# Patient Record
Sex: Female | Born: 1975 | Race: White | Hispanic: No | Marital: Married | Smoking: Never smoker
Health system: Southern US, Community
[De-identification: ages and names within clinical notes are randomized; demographics above are authoritative.]

## PROBLEM LIST (undated history)

## (undated) DIAGNOSIS — T7840XA Allergy, unspecified, initial encounter: Secondary | ICD-10-CM

## (undated) DIAGNOSIS — R011 Cardiac murmur, unspecified: Secondary | ICD-10-CM

## (undated) DIAGNOSIS — B009 Herpesviral infection, unspecified: Secondary | ICD-10-CM

## (undated) HISTORY — DX: Herpesviral infection, unspecified: B00.9

## (undated) HISTORY — DX: Cardiac murmur, unspecified: R01.1

## (undated) HISTORY — DX: Allergy, unspecified, initial encounter: T78.40XA

---
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
## 1998-11-22 HISTORY — PX: TONSILECTOMY/ADENOIDECTOMY WITH MYRINGOTOMY: SHX6125

## 2011-08-23 HISTORY — PX: KNEE SURGERY: SHX244

## 2012-12-01 ENCOUNTER — Ambulatory Visit (INDEPENDENT_AMBULATORY_CARE_PROVIDER_SITE_OTHER): Payer: Managed Care, Other (non HMO) | Admitting: Physician Assistant

## 2012-12-01 ENCOUNTER — Encounter: Payer: Self-pay | Admitting: Physician Assistant

## 2012-12-01 VITALS — BP 115/68 | HR 73 | Ht 61.5 in | Wt 121.0 lb

## 2012-12-01 DIAGNOSIS — J302 Other seasonal allergic rhinitis: Secondary | ICD-10-CM

## 2012-12-01 DIAGNOSIS — B002 Herpesviral gingivostomatitis and pharyngotonsillitis: Secondary | ICD-10-CM

## 2012-12-01 DIAGNOSIS — A6000 Herpesviral infection of urogenital system, unspecified: Secondary | ICD-10-CM

## 2012-12-01 DIAGNOSIS — J309 Allergic rhinitis, unspecified: Secondary | ICD-10-CM | POA: Insufficient documentation

## 2012-12-01 DIAGNOSIS — Z23 Encounter for immunization: Secondary | ICD-10-CM

## 2012-12-01 MED ORDER — ACYCLOVIR 800 MG PO TABS: 800.0000 mg | ORAL_TABLET | Freq: Two times a day (BID) | ORAL | Status: DC

## 2012-12-01 MED ORDER — MOMETASONE FUROATE 50 MCG/ACT NA SUSP: 2.0000 | Freq: Every day | NASAL | Status: DC

## 2012-12-01 NOTE — Progress Notes (Signed)
  Subjective:    Patient ID: Whitney Turner, female    DOB: Aug 25, 1976, 37 y.o.   MRN: [id number]  HPI Patient is a 37 year old female who presents to the clinic to establish care. Past medical history reviewed and positive for seasonal allergies, oral and genital herpes, allergic rhinitis.  Seasonal allergies are ongoing and controlled with Zyrtec daily. 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 She has used Nasonex In the past and has worked well. She would like a prescription for it today.  Patient has ongoing but controlled oral and genital herpes. She has not had an outbreak in over a year. She would however like a prescription just in case she gets an outbreak.   Review of Systems  Constitutional: Negative.   Eyes: Negative.   Respiratory: Negative.   Cardiovascular: Negative.   Musculoskeletal: Negative.   Neurological: Negative.   Psychiatric/Behavioral: Negative.        Objective:   Physical Exam  Constitutional: She is oriented to person, place, and time. She appears well-developed and well-nourished.  HENT:  Head: Normocephalic and atraumatic.  Right Ear: External ear normal.  Left Ear: External ear normal.  Nose: Nose normal.  Mouth/Throat: Oropharynx is clear and moist. No oropharyngeal exudate.  Eyes: Conjunctivae normal are normal.  Neck: Normal range of motion. Neck supple.  Cardiovascular: Normal rate and regular rhythm.   Murmur heard.      Grade 2/3 systolic ejection murmur.  Neurological: She is alert and oriented to person, place, and time.  Skin: Skin is warm and dry.  Psychiatric: She has a normal mood and affect. Her behavior is normal.          Assessment & Plan:  Seasonal allergies/allergic rhinitis-encouraged patient to continue taking Zyrtec daily. Did give prescription for Nasonex to use as needed for allergic rhinitis.   oral and genital herpes-did give  prescription for acyclovir for 5 days for outbreaks.  Note was written for patient to give to employer that blood pressure was under control and no concerning signs or symptoms on exam today.  TF was given in office today with no complications.  Patient was encouraged to make complete physical/appointment and come in fasting to have labs drawn in the near future.

## 2012-12-01 NOTE — Patient Instructions (Signed)
Given rx for nasonex and acyclovir.

## 2012-12-08 ENCOUNTER — Encounter: Payer: Self-pay | Admitting: Physician Assistant

## 2012-12-08 ENCOUNTER — Ambulatory Visit (INDEPENDENT_AMBULATORY_CARE_PROVIDER_SITE_OTHER): Payer: Managed Care, Other (non HMO) | Admitting: Physician Assistant

## 2012-12-08 ENCOUNTER — Other Ambulatory Visit (HOSPITAL_COMMUNITY)
Admission: RE | Admit: 2012-12-08 | Discharge: 2012-12-08 | Disposition: A | Discharge status: Home / Self Care | Payer: Managed Care, Other (non HMO) | Source: Ambulatory Visit | Attending: Family Medicine | Admitting: Family Medicine

## 2012-12-08 VITALS — BP 113/72 | HR 74 | Wt 121.0 lb

## 2012-12-08 DIAGNOSIS — Z131 Encounter for screening for diabetes mellitus: Secondary | ICD-10-CM

## 2012-12-08 DIAGNOSIS — Z13220 Encounter for screening for lipoid disorders: Secondary | ICD-10-CM

## 2012-12-08 DIAGNOSIS — Z01419 Encounter for gynecological examination (general) (routine) without abnormal findings: Secondary | ICD-10-CM

## 2012-12-08 DIAGNOSIS — Z1151 Encounter for screening for human papillomavirus (HPV): Secondary | ICD-10-CM | POA: Insufficient documentation

## 2012-12-08 DIAGNOSIS — Z8349 Family history of other endocrine, nutritional and metabolic diseases: Secondary | ICD-10-CM

## 2012-12-08 DIAGNOSIS — R87810 Cervical high risk human papillomavirus (HPV) DNA test positive: Secondary | ICD-10-CM | POA: Insufficient documentation

## 2012-12-08 DIAGNOSIS — Z124 Encounter for screening for malignant neoplasm of cervix: Secondary | ICD-10-CM

## 2012-12-08 LAB — COMPREHENSIVE METABOLIC PANEL
BUN: 15 mg/dL (ref 6–23)
CO2: 28 mEq/L (ref 19–32)
Glucose, Bld: 84 mg/dL (ref 70–99)
Sodium: 137 mEq/L (ref 135–145)
Total Bilirubin: 0.6 mg/dL (ref 0.3–1.2)
Total Protein: 7 g/dL (ref 6.0–8.3)

## 2012-12-08 LAB — TSH: TSH: 1.131 u[IU]/mL (ref 0.350–4.500)

## 2012-12-08 LAB — LIPID PANEL
Cholesterol: 211 mg/dL — ABNORMAL HIGH (ref 0–200)
HDL: 65 mg/dL (ref >39)
Triglycerides: 43 mg/dL (ref <150)

## 2012-12-08 NOTE — Progress Notes (Signed)
  Subjective:    Patient ID: Whitney Turner, female    DOB: 1976-06-22, 37 y.o.   MRN: [id number]  HPI  Period ares normal. No problems. Stayed on BC   Review of Systems     Objective:   Physical Exam        Assessment & Plan:   Subjective:     Whitney Turner is a 37 y.o. female and is here for a comprehensive physical exam. The patient reports no problems.  Periods are regular and with normal flow. No problems and no concerns.   Her sister has a lot of thyroid issues she wants checked today.   History   Social History  . Marital Status: Married    Spouse Name: N/A    Number of Children: N/A  . Years of Education: N/A   Occupational History  . Not on file.   Social History Main Topics  . Smoking status: Never Smoker   . Smokeless tobacco: Not on file  . Alcohol Use: Not on file  . Drug Use: Not on file  . Sexually Active: Yes   Other Topics Concern  . Not on file   Social History Narrative  . No narrative on file   Health Maintenance  Topic Date Due  . Pap Smear  10/02/1996  . Tetanus/tdap  12/01/2022  . Influenza Vaccine  07/23/2012    The following portions of the patient's history were reviewed and updated as appropriate: allergies, current medications, past family history, past medical history, past social history, past surgical history and problem list.  Review of Systems A comprehensive review of systems was negative.   Objective:    BP 113/72  Pulse 74  Wt 121 lb (54.885 kg)  LMP 11/13/2012 General appearance: alert, cooperative and appears stated age Head: Normocephalic, without obvious abnormality, atraumatic Eyes: conjunctivae/corneas clear. PERRL, EOM's intact. Fundi benign. Ears: normal TM's and external ear canals both ears Nose: Nares normal. Septum midline. Mucosa normal. No drainage or sinus tenderness. Throat: lips, mucosa, and tongue normal; teeth and gums normal Neck: no adenopathy, no carotid bruit, no JVD, supple, symmetrical,  trachea midline and thyroid not enlarged, symmetric, no tenderness/mass/nodules Back: symmetric, no curvature. ROM normal. No CVA tenderness. Lungs: clear to auscultation bilaterally Breasts: normal appearance, no masses or tenderness Heart: regular rate and rhythm, S1, S2 normal, no murmur, click, rub or gallop Abdomen: soft, non-tender; bowel sounds normal; no masses,  no organomegaly Pelvic: cervix normal in appearance, external genitalia normal, no adnexal masses or tenderness, no cervical motion tenderness, uterus normal size, shape, and consistency and vagina normal without discharge Extremities: extremities normal, atraumatic, no cyanosis or edema Pulses: 2+ and symmetric Skin: Skin color, texture, turgor normal. No rashes or lesions Lymph nodes: Cervical, supraclavicular, and axillary nodes normal. Neurologic: Grossly normal    Assessment:    Healthy female exam.      Plan:    CPE- will call pt with results of fasting labs. All vaccines are up to date. Will check TSH due to family history of issues. Encouraged pt to exercise regularly, maintain a healthy diet, Start taking MVI with calcium and vitamin D.Will call with pap results. Follow up in 1year.  See After Visit Summary for Counseling Recommendations

## 2012-12-08 NOTE — Patient Instructions (Signed)
Reminder:  Exercise regularly.  Calcium and Vitamin D for bone health.   Wil call with labs.

## 2012-12-11 ENCOUNTER — Other Ambulatory Visit: Payer: Self-pay | Admitting: *Deleted

## 2012-12-11 MED ORDER — MOMETASONE FUROATE 50 MCG/ACT NA SUSP: 2.0000 | Freq: Every day | NASAL | Status: DC

## 2012-12-11 MED ORDER — ACYCLOVIR 800 MG PO TABS: 800.0000 mg | ORAL_TABLET | Freq: Two times a day (BID) | ORAL | Status: DC

## 2012-12-15 ENCOUNTER — Telehealth: Payer: Self-pay | Admitting: Physician Assistant

## 2012-12-15 NOTE — Telephone Encounter (Signed)
LM with patient to call regarding pap smear results. Pt needs to call back to get these results.

## 2012-12-18 ENCOUNTER — Telehealth: Payer: Self-pay | Admitting: Physician Assistant

## 2012-12-18 ENCOUNTER — Other Ambulatory Visit: Payer: Self-pay | Admitting: Physician Assistant

## 2012-12-18 DIAGNOSIS — IMO0002 Reserved for concepts with insufficient information to code with codable children: Secondary | ICD-10-CM

## 2012-12-18 NOTE — Telephone Encounter (Signed)
Whitney Turner was able to contact patient and referral made.

## 2012-12-18 NOTE — Telephone Encounter (Signed)
I have tried calling 3 times to reach patient. I would like for you to mail results of abnormal pap and see if patient will call us to get follow up instructions. I will put copy of pap on desk for Itati.

## 2012-12-19 ENCOUNTER — Telehealth: Payer: Self-pay | Admitting: *Deleted

## 2012-12-19 NOTE — Telephone Encounter (Signed)
Pt calls this morning & wants to know if there is an antibiotic or something that she should be taking for HPV?  Please advise

## 2012-12-20 NOTE — Telephone Encounter (Signed)
Left message for pt to call back  °

## 2012-12-20 NOTE — Telephone Encounter (Signed)
Left message on machine telling pt that there is no abx to take.  She is following up with gyn feb 13th

## 2012-12-20 NOTE — Telephone Encounter (Signed)
There is no abx. It is a sexually transmitted virus that can lead to cervical cancer.

## 2013-01-04 ENCOUNTER — Encounter: Payer: Managed Care, Other (non HMO) | Admitting: Obstetrics & Gynecology

## 2013-01-11 ENCOUNTER — Encounter: Payer: Self-pay | Admitting: Family Medicine

## 2013-01-11 ENCOUNTER — Ambulatory Visit (INDEPENDENT_AMBULATORY_CARE_PROVIDER_SITE_OTHER): Payer: Managed Care, Other (non HMO) | Admitting: Family Medicine

## 2013-01-11 ENCOUNTER — Encounter: Payer: Managed Care, Other (non HMO) | Admitting: Family Medicine

## 2013-01-11 VITALS — BP 119/80 | HR 68 | Temp 98.0°F | Ht 61.5 in | Wt 126.0 lb

## 2013-01-11 DIAGNOSIS — B9689 Other specified bacterial agents as the cause of diseases classified elsewhere: Secondary | ICD-10-CM

## 2013-01-11 DIAGNOSIS — A499 Bacterial infection, unspecified: Secondary | ICD-10-CM

## 2013-01-11 DIAGNOSIS — J329 Chronic sinusitis, unspecified: Secondary | ICD-10-CM

## 2013-01-11 MED ORDER — AMOXICILLIN-POT CLAVULANATE 500-125 MG PO TABS: ORAL_TABLET | ORAL | Status: AC

## 2013-01-11 MED ORDER — FLUTICASONE PROPIONATE 50 MCG/ACT NA SUSP: NASAL | Status: DC

## 2013-01-11 NOTE — Progress Notes (Signed)
CC: Whitney Turner is a 37 y.o. female is here for Sinusitis   Subjective: HPI:  Patient of onset for head pressure with nasal congestion for 4 weeks. It has been getting worse a weekly basis. She's been treating it with ibuprofen which helps relieve the pain, nasal congestion is present all hours of the day and nothing makes better or worse. Sinus pressure is worse with exertion. Pain is described as moderate in severity and nonradiating. She's taking Zyrtec as well but hasn't noticed much improvement. She has a sore throat every other day, she's unsure what makes it better or worse. She denies fevers, chills, cough, chest congestion, chest pain, shortness of breath, orthopnea. She denies motor sensory disturbances   Review Of Systems Outlined In HPI  Past Medical History  Diagnosis Date  . Allergy   . Herpes   . Heart murmur      Family History  Problem Relation Age of Onset  . Hypertension Father   . Hyperlipidemia Father   . Cancer Maternal Grandmother     breast cancer  . Cancer Maternal Grandfather     lung cancer  . Alzheimer's disease Paternal Grandmother   . Cancer Paternal Grandfather     lung cancer     History  Substance Use Topics  . Smoking status: Never Smoker   . Smokeless tobacco: Not on file  . Alcohol Use: Not on file     Objective: Filed Vitals:   01/11/13 1107  BP: 119/80  Pulse: 68  Temp: 98 F (36.7 C)    General: Alert and Oriented, No Acute Distress HEENT: Pupils equal, round, reactive to light. Conjunctivae clear.  External ears unremarkable, canals clear with intact TMs with appropriate landmarks.  Middle ear appears open without effusion. Boggy inferior turbinates with mild mucoid discharge.  Moist mucous membranes, pharynx without inflammation nor lesions.  Neck supple without palpable lymphadenopathy nor abnormal masses. Frontal sinus tenderness to percussion Lungs: Clear to auscultation bilaterally, no wheezing/ronchi/rales.  Comfortable  work of breathing. Good air movement. Cardiac: Regular rate and rhythm. Normal S1/S2.  No murmurs, rubs, nor gallops.   Extremities: No peripheral edema.  Strong peripheral pulses.  Mental Status: No depression, anxiety, nor agitation. Skin: Warm and dry.  Assessment & Plan: Asharia was seen today for sinusitis.  Diagnoses and associated orders for this visit:  Bacterial sinusitis - fluticasone (FLONASE) 50 MCG/ACT nasal spray; One spray each nostril daily. - amoxicillin-clavulanate (AUGMENTIN) 500-125 MG per tablet; Take one by mouth every 8 hours for ten total days.    Bacterial sinusitis: Given that symptoms are greater than 10 days like her to start Augmentin, discussed nasal saline washes and continuation of Zyrtec and ibuprofen as needed. Nasonex was over $100 which is why she stopped it months ago. Will try Flonase and encouraged her to ask for formulary substitution options if expensive.  Return if symptoms worsen or fail to improve.

## 2013-01-31 ENCOUNTER — Encounter: Payer: Self-pay | Admitting: Obstetrics & Gynecology

## 2013-01-31 ENCOUNTER — Ambulatory Visit (INDEPENDENT_AMBULATORY_CARE_PROVIDER_SITE_OTHER): Payer: Managed Care, Other (non HMO) | Admitting: Obstetrics & Gynecology

## 2013-01-31 VITALS — BP 120/67 | HR 76 | Resp 16 | Ht 61.5 in | Wt 123.0 lb

## 2013-01-31 DIAGNOSIS — N898 Other specified noninflammatory disorders of vagina: Secondary | ICD-10-CM

## 2013-01-31 DIAGNOSIS — Z01812 Encounter for preprocedural laboratory examination: Secondary | ICD-10-CM

## 2013-01-31 DIAGNOSIS — R87610 Atypical squamous cells of undetermined significance on cytologic smear of cervix (ASC-US): Secondary | ICD-10-CM

## 2013-01-31 DIAGNOSIS — Z139 Encounter for screening, unspecified: Secondary | ICD-10-CM

## 2013-01-31 LAB — POCT URINE PREGNANCY: Preg Test, Ur: NEGATIVE

## 2013-01-31 NOTE — Progress Notes (Signed)
  Subjective:    Patient ID: Whitney Turner, female    DOB: [birth year], 37 y.o.   MRN: [id number]  HPI  Mrs. Ramires is a 37 yo MW P3 non- smoker who had her first abnormal pap, ASCUS, +HR HPV DNA, in January. She is here today for a colposcopy. She also complains of a new increased non-smelly, clear/white vaginal discharge necessitating the use of a panty liner.  Review of Systems     Objective:   Physical Exam UPT negative, consent signed, time-out done Acetic acid applied for 2 minutes Colpo adequate A small line of punctation slowly appeared from the 11 o'clock position to the 2 o'clock position. I biopsied this and used silver nitrate to yield hemostasis. I did an ECC.  She tolerated the procedure well.       Assessment & Plan:  Await biopsies Wet prep sent.

## 2013-02-02 ENCOUNTER — Telehealth: Payer: Self-pay | Admitting: *Deleted

## 2013-02-02 MED ORDER — ACYCLOVIR 800 MG PO TABS: 800.0000 mg | ORAL_TABLET | Freq: Two times a day (BID) | ORAL | Status: DC

## 2013-02-02 NOTE — Telephone Encounter (Signed)
Pt calls requesting an rx for generic valtrex sent to [facility].

## 2013-02-02 NOTE — Telephone Encounter (Signed)
LMOM notifying pt. 

## 2013-02-02 NOTE — Telephone Encounter (Signed)
Sent what we had given before to treat.

## 2013-02-03 LAB — WET PREP, GENITAL: Yeast Wet Prep HPF POC: NONE SEEN

## 2013-02-05 ENCOUNTER — Telehealth: Payer: Self-pay | Admitting: *Deleted

## 2013-02-05 DIAGNOSIS — N898 Other specified noninflammatory disorders of vagina: Secondary | ICD-10-CM

## 2013-02-05 MED ORDER — METRONIDAZOLE 500 MG PO TABS: 500.0000 mg | ORAL_TABLET | Freq: Two times a day (BID) | ORAL | Status: DC

## 2013-02-05 NOTE — Telephone Encounter (Signed)
Called pt to [facility] meds at [facility]

## 2013-02-14 ENCOUNTER — Other Ambulatory Visit: Payer: Self-pay | Admitting: Obstetrics & Gynecology

## 2013-06-11 ENCOUNTER — Other Ambulatory Visit: Payer: Self-pay | Admitting: Physician Assistant

## 2013-06-11 ENCOUNTER — Telehealth: Payer: Self-pay | Admitting: *Deleted

## 2013-06-11 MED ORDER — METRONIDAZOLE 500 MG PO TABS: 500.0000 mg | ORAL_TABLET | Freq: Two times a day (BID) | ORAL | Status: DC

## 2013-06-11 NOTE — Progress Notes (Signed)
Pt stated that she did get both tampons out.  Faxing rx to [facility] since pt is driving thru town again this afternoon.

## 2013-06-11 NOTE — Progress Notes (Signed)
Where do I need to fax too? I printed.

## 2013-06-11 NOTE — Telephone Encounter (Signed)
Pt left a message stating that she has been on a road trip & accidentally inserted 2 tampons & now has discharge.  She wants to know if you could call her in metronidazole.  She is heading to [place] today so if you're ok with that I can get a specific pharm to send it to in [place].

## 2013-06-11 NOTE — Telephone Encounter (Signed)
Since out of town will send [facility] for BV. Make sure you get both tampons out if not could turn into something more dangerous.

## 2013-08-14 ENCOUNTER — Other Ambulatory Visit: Payer: Self-pay | Admitting: Physician Assistant

## 2014-01-07 ENCOUNTER — Ambulatory Visit (INDEPENDENT_AMBULATORY_CARE_PROVIDER_SITE_OTHER): Payer: 59 | Admitting: Physician Assistant

## 2014-01-07 ENCOUNTER — Encounter: Payer: Self-pay | Admitting: Physician Assistant

## 2014-01-07 VITALS — BP 112/54 | HR 70 | Temp 98.0°F | Wt 123.0 lb

## 2014-01-07 DIAGNOSIS — J029 Acute pharyngitis, unspecified: Secondary | ICD-10-CM

## 2014-01-07 LAB — POCT RAPID STREP A (OFFICE): Rapid Strep A Screen: NEGATIVE

## 2014-01-07 MED ORDER — METHYLPREDNISOLONE (PAK) 4 MG PO TABS: ORAL_TABLET | ORAL | Status: DC

## 2014-01-07 NOTE — Patient Instructions (Signed)
Add sudafed for next 5 days.  Flonase regularly.  Continue on zyrtec.  Use medrol dose pk if symptoms continue.

## 2014-01-07 NOTE — Progress Notes (Signed)
   Subjective:    Patient ID: Whitney Turner, female    DOB: 05/19/1976, 38 y.o.   MRN: [id number]  HPI Patient is a 38 year old female who presents to the clinic with sore throat for the last week and a half. She denies any sinus pressure, ear pain, cough, shortness of breath or wheezing. She has not had any fever. She denies any sick contacts. She's tried Chloraseptic spray and ibuprofen which has helped some. She just wanted to make sure she did not have strep. She takes Zyrtec daily.     Review of Systems     Objective:   Physical Exam  Constitutional: She is oriented to person, place, and time. She appears well-developed and well-nourished.  HENT:  Head: Normocephalic and atraumatic.  Right Ear: External ear normal.  Left Ear: External ear normal.  Nose: Nose normal.  TMs clear bilaterally.  Oropharynx erythematous without tonsil or exudate. Some postnasal drip present on exam today.  Eyes: Conjunctivae are normal. Right eye exhibits no discharge. Left eye exhibits no discharge.  Neck: Normal range of motion. Neck supple.  Cardiovascular: Normal rate, regular rhythm and normal heart sounds.   Pulmonary/Chest: Effort normal and breath sounds normal. She has no wheezes.  Lymphadenopathy:    She has no cervical adenopathy.  Neurological: She is alert and oriented to person, place, and time.  Skin: Skin is dry.  Psychiatric: She has a normal mood and affect. Her behavior is normal.          Assessment & Plan:  Acute pharyngitis-rapid strep negative .discuss with patient likely viral. Discussed other symptomatic treatment patient could initiate. Encourage adding Sudafed for the next 5 days to the Zyrtec. Consider using nasal spray at home daily for the next 5 days. If continuing then could try a Medrol Dosepak to help decrease inflammation. Call if symptoms worsening or develops fever.

## 2014-01-31 ENCOUNTER — Other Ambulatory Visit: Payer: Self-pay | Admitting: Physician Assistant

## 2014-06-05 ENCOUNTER — Encounter: Payer: Self-pay | Admitting: Physician Assistant

## 2014-06-05 ENCOUNTER — Ambulatory Visit (INDEPENDENT_AMBULATORY_CARE_PROVIDER_SITE_OTHER): Payer: PRIVATE HEALTH INSURANCE | Admitting: Physician Assistant

## 2014-06-05 VITALS — BP 120/75 | HR 67 | Temp 98.1°F | Ht 61.5 in | Wt 121.0 lb

## 2014-06-05 DIAGNOSIS — J309 Allergic rhinitis, unspecified: Secondary | ICD-10-CM

## 2014-06-05 DIAGNOSIS — R491 Aphonia: Secondary | ICD-10-CM

## 2014-06-05 DIAGNOSIS — J301 Allergic rhinitis due to pollen: Secondary | ICD-10-CM

## 2014-06-05 DIAGNOSIS — R599 Enlarged lymph nodes, unspecified: Secondary | ICD-10-CM

## 2014-06-05 DIAGNOSIS — J302 Other seasonal allergic rhinitis: Secondary | ICD-10-CM

## 2014-06-05 MED ORDER — BECLOMETHASONE DIPROPIONATE 80 MCG/ACT NA AERS: 2.0000 | INHALATION_SPRAY | Freq: Every day | NASAL | Status: DC

## 2014-06-05 MED ORDER — METHYLPREDNISOLONE (PAK) 4 MG PO TABS: ORAL_TABLET | ORAL | Status: DC

## 2014-06-05 NOTE — Progress Notes (Signed)
   Subjective:    Patient ID: Whitney Turner, female    DOB: [birth year], 38 y.o.   MRN: [id number]  HPI Patient is a 38 year old female who presents to the clinic with on and off again swollen lymph nodes and also voiced. In mid July the patient had a GI bug that left her feeling bad for about 24 hours. She felt like she made a full recovery. Since then she has had a discomfort in her throat. She reports her throat feeling tight. She would occasionally feel like her lymph nodes are swelling and her voice will be raspy. She frequently loses her voice. She does have ongoing allergies which she takes Zyrtec daily 4. She has been using Flonase off and on for the last 2-3 weeks with little to no improvement. She denies any watery or itchy eyes. She denies any fever, chills, cough or shortness of breath. Today she feels better but she can feel worse tonight or even the next day. She is concerned she has throat cancer. Patient has never smoked.    Review of Systems  All other systems reviewed and are negative.      Objective:   Physical Exam  Constitutional: She is oriented to person, place, and time. She appears well-developed and well-nourished.  HENT:  Head: Normocephalic and atraumatic.  Right Ear: External ear normal.  Left Ear: External ear normal.  Nose: Nose normal.  Mouth/Throat: Oropharynx is clear and moist. No oropharyngeal exudate.  TMs clear bilaterally.  Tonsils removed.  Nasal turbinates red but patent bilaterally.   Negative for any maxillary or frontal sinus tenderness.   Eyes: Conjunctivae and EOM are normal. Pupils are equal, round, and reactive to light. Right eye exhibits no discharge. Left eye exhibits no discharge.  Neck: Normal range of motion. Neck supple.  Slight enlargement without tenderness to left upper cervical lymph node to palpation. No other palpable lymph node enlargement.   Cardiovascular: Normal rate, regular rhythm and normal heart sounds.    Pulmonary/Chest: Effort normal and breath sounds normal. She has no wheezes.  Abdominal: Bowel sounds are normal.  Neurological: She is alert and oriented to person, place, and time.  Skin: Skin is dry.  Psychiatric: She has a normal mood and affect. Her behavior is normal.          Assessment & Plan:   Seasonal allergies/reactive lyphm node/largngitis/allergic rhinitis- discussed with patient she has been on Zyrtec daily for many years. I would consider trying Allegra D. OTC. I have also given her Medrol Dosepak to start. I would also like for her to stop Flonase and start Qnasl. Sample of Q. nasal was given in office with prescription. Symptoms sound very consistent with allergic. No signs of bacterial sinusitis and/or any other bacterial infection. Today she has a normal voice with no loss; however, pt is very concerned. If above treatment does not resolve symptoms will send to ENT for evaluation so they can look at larynx and make sure nothing is growing there.

## 2014-06-05 NOTE — Patient Instructions (Signed)
Try Allegra instead of zyrtec. Consider allergra D for decongestant.  Start qnasal 2 sprays each nostril once a day. Start medrol dose pak today.

## 2014-07-24 ENCOUNTER — Other Ambulatory Visit: Payer: Self-pay | Admitting: Physician Assistant

## 2014-09-03 ENCOUNTER — Other Ambulatory Visit: Payer: Self-pay | Admitting: Physician Assistant

## 2014-10-28 ENCOUNTER — Ambulatory Visit (INDEPENDENT_AMBULATORY_CARE_PROVIDER_SITE_OTHER): Payer: PRIVATE HEALTH INSURANCE | Admitting: Physician Assistant

## 2014-10-28 ENCOUNTER — Encounter: Payer: Self-pay | Admitting: Physician Assistant

## 2014-10-28 VITALS — BP 121/75 | HR 100 | Temp 99.2°F | Ht 61.5 in | Wt 125.0 lb

## 2014-10-28 DIAGNOSIS — J029 Acute pharyngitis, unspecified: Secondary | ICD-10-CM

## 2014-10-28 DIAGNOSIS — R059 Cough, unspecified: Secondary | ICD-10-CM

## 2014-10-28 DIAGNOSIS — R05 Cough: Secondary | ICD-10-CM

## 2014-10-28 LAB — POCT RAPID STREP A (OFFICE): Rapid Strep A Screen: NEGATIVE

## 2014-10-28 MED ORDER — HYDROCODONE-HOMATROPINE 5-1.5 MG/5ML PO SYRP: 5.0000 mL | ORAL_SOLUTION | Freq: Every evening | ORAL | Status: DC | PRN

## 2014-10-28 MED ORDER — PREDNISONE 50 MG PO TABS: 50.0000 mg | ORAL_TABLET | Freq: Every day | ORAL | Status: DC

## 2014-10-28 NOTE — Patient Instructions (Signed)

## 2014-10-28 NOTE — Progress Notes (Signed)
   Subjective:    Patient ID: Whitney Turner, female    DOB: 1976/09/20, 38 y.o.   MRN: [id number]  HPI Pt presents to the clinic with ST for 1 week. Denies any fever, chills, n/v/d, sinus pressure, SOB, wheezing, or ear pain. Does have a night time cough. ST feels like pins and needles on and on in intensity. Tried some tylenol cold and sinus with no relief. Pt continues to exercise and run daily just tired of ST.    Review of Systems  All other systems reviewed and are negative.      Objective:   Physical Exam  Constitutional: She is oriented to person, place, and time. She appears well-developed and well-nourished.  HENT:  Head: Normocephalic and atraumatic.  Right Ear: External ear normal.  Left Ear: External ear normal.  Nose: Nose normal.  Mouth/Throat: Oropharynx is clear and moist. No oropharyngeal exudate.  Eyes: Conjunctivae are normal. Right eye exhibits no discharge. Left eye exhibits no discharge.  Neck: Normal range of motion. Neck supple. No thyromegaly present.  Left sided slight lymph node enlargement of anterior cervical nodes.   Cardiovascular: Normal rate, regular rhythm and normal heart sounds.   Pulmonary/Chest: Effort normal and breath sounds normal. She has no wheezes.  Neurological: She is alert and oriented to person, place, and time.  Skin: Skin is dry.  Psychiatric: She has a normal mood and affect. Her behavior is normal.          Assessment & Plan:  Sore throat/Acute pharyngitis/cough- rapid strep negative. No signs of bacterial infection. Discussed adding back nasal spray. Prednisone given. Hycodan for night time cough. Consider ibuprofen as needed for ST. Salt water gargles. Call with any worsening symptoms. HO given.

## 2014-11-24 ENCOUNTER — Other Ambulatory Visit: Payer: Self-pay | Admitting: Physician Assistant

## 2014-12-30 ENCOUNTER — Ambulatory Visit (INDEPENDENT_AMBULATORY_CARE_PROVIDER_SITE_OTHER): Payer: PRIVATE HEALTH INSURANCE | Admitting: Physician Assistant

## 2014-12-30 ENCOUNTER — Encounter: Payer: Self-pay | Admitting: Physician Assistant

## 2014-12-30 VITALS — BP 109/62 | HR 80 | Ht 61.5 in | Wt 127.0 lb

## 2014-12-30 DIAGNOSIS — Z0000 Encounter for general adult medical examination without abnormal findings: Secondary | ICD-10-CM

## 2014-12-30 DIAGNOSIS — Z131 Encounter for screening for diabetes mellitus: Secondary | ICD-10-CM

## 2014-12-30 DIAGNOSIS — E7800 Pure hypercholesterolemia, unspecified: Secondary | ICD-10-CM

## 2014-12-30 NOTE — Patient Instructions (Signed)

## 2014-12-30 NOTE — Progress Notes (Signed)
  Subjective:     Whitney Turner is a 39 y.o. female and is here for a comprehensive physical exam. The patient reports no problems.  History   Social History  . Marital Status: Married    Spouse Name: N/A    Number of Children: N/A  . Years of Education: N/A   Occupational History  . Not on file.   Social History Main Topics  . Smoking status: Never Smoker   . Smokeless tobacco: Not on file  . Alcohol Use: Not on file  . Drug Use: Not on file  . Sexual Activity: Yes   Other Topics Concern  . Not on file   Social History Narrative   Health Maintenance  Topic Date Due  . INFLUENZA VACCINE  06/30/2015 (Originally 06/22/2014)  . PAP SMEAR  12/09/2015  . TETANUS/TDAP  12/01/2022    The following portions of the patient's history were reviewed and updated as appropriate: allergies, current medications, past family history, past medical history, past social history, past surgical history and problem list.  Review of Systems A comprehensive review of systems was negative.   Objective:    BP 109/62 mmHg  Pulse 80  Ht 5' 1.5" (1.562 m)  Wt 127 lb (57.607 kg)  BMI 23.61 kg/m2 General appearance: alert, cooperative and appears stated age Head: Normocephalic, without obvious abnormality, atraumatic Eyes: conjunctivae/corneas clear. PERRL, EOM's intact. Fundi benign. Ears: normal TM's and external ear canals both ears Nose: Nares normal. Septum midline. Mucosa normal. No drainage or sinus tenderness. Throat: lips, mucosa, and tongue normal; teeth and gums normal Neck: no adenopathy, no carotid bruit, no JVD, supple, symmetrical, trachea midline and thyroid not enlarged, symmetric, no tenderness/mass/nodules Back: symmetric, no curvature. ROM normal. No CVA tenderness. Lungs: clear to auscultation bilaterally Heart: regular rate and rhythm, S1, S2 normal, no murmur, click, rub or gallop Abdomen: soft, non-tender; bowel sounds normal; no masses,  no organomegaly Extremities:  extremities normal, atraumatic, no cyanosis or edema Pulses: 2+ and symmetric Skin: Skin color, texture, turgor normal. No rashes or lesions Lymph nodes: Cervical, supraclavicular, and axillary nodes normal. Neurologic: Grossly normal      Assessment:    Healthy female exam.      Plan:    CPE-pap 2014. Declined flu shot. Tdap up to date. Needs lipid and cmp ordered today. Discussed calcium and vitamin D1200mg /800units. Encouraged regular exercise. Paperwork for [employer] games filled out.  See After Visit Summary for Counseling Recommendations

## 2015-01-02 ENCOUNTER — Other Ambulatory Visit: Payer: Self-pay | Admitting: Physician Assistant

## 2015-04-30 ENCOUNTER — Other Ambulatory Visit: Payer: Self-pay | Admitting: Physician Assistant

## 2015-05-06 ENCOUNTER — Telehealth: Payer: Self-pay

## 2015-05-06 ENCOUNTER — Other Ambulatory Visit: Payer: Self-pay

## 2015-05-06 MED ORDER — BECLOMETHASONE DIPROPIONATE 80 MCG/ACT NA AERS: 2.0000 | INHALATION_SPRAY | Freq: Every day | NASAL | Status: DC

## 2015-05-06 NOTE — Telephone Encounter (Signed)
Opened in error

## 2015-06-06 ENCOUNTER — Other Ambulatory Visit: Payer: Self-pay | Admitting: Physician Assistant

## 2015-07-11 ENCOUNTER — Other Ambulatory Visit: Payer: Self-pay | Admitting: Physician Assistant

## 2015-07-11 MED ORDER — ACYCLOVIR 800 MG PO TABS: ORAL_TABLET | ORAL | Status: DC

## 2015-07-15 ENCOUNTER — Other Ambulatory Visit: Payer: Self-pay | Admitting: Physician Assistant

## 2015-09-18 ENCOUNTER — Other Ambulatory Visit: Payer: Self-pay | Admitting: Physician Assistant

## 2015-10-22 ENCOUNTER — Other Ambulatory Visit: Payer: Self-pay | Admitting: Physician Assistant

## 2015-11-06 ENCOUNTER — Other Ambulatory Visit: Payer: Self-pay | Admitting: Physician Assistant

## 2015-12-29 ENCOUNTER — Ambulatory Visit (INDEPENDENT_AMBULATORY_CARE_PROVIDER_SITE_OTHER): Payer: PRIVATE HEALTH INSURANCE | Admitting: Family Medicine

## 2015-12-29 ENCOUNTER — Encounter: Payer: Self-pay | Admitting: Family Medicine

## 2015-12-29 VITALS — BP 129/68 | HR 69 | Wt 125.0 lb

## 2015-12-29 DIAGNOSIS — M722 Plantar fascial fibromatosis: Secondary | ICD-10-CM | POA: Diagnosis not present

## 2015-12-29 MED ORDER — MELOXICAM 15 MG PO TABS: 15.0000 mg | ORAL_TABLET | Freq: Every day | ORAL | Status: DC | PRN

## 2015-12-29 MED ORDER — NITROGLYCERIN 0.2 MG/HR TD PT24
MEDICATED_PATCH | TRANSDERMAL | Status: DC
Start: 2015-12-29 — End: 2016-01-26

## 2015-12-29 NOTE — Assessment & Plan Note (Signed)
Plantar fasciitis or arch strain. Plan to treat with eccentric exercises, ice, nitroglycerin patch protocol and recheck in one month. I feel nitroglycerin is reasonable as the midportion of the plantar fascia is tender not the insertion. The fat pad is much less thick in this area and likely will be responsive to the topical nitroglycerin.

## 2015-12-29 NOTE — Progress Notes (Signed)
   Subjective:    I'm seeing this patient as a consultation for:  Tandy Gaw PA-C  CC: Left plantar fasciitis  HPI: Patient has a three-year history of bothersome left-sided plantar fasciitis. 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 She notes her pain tends to worsen during the duration of the softball season. She denies much pain at rest. A year ago she was seen by a podiatrist to get a series of injections used orthotics and prescribe some meloxicam. This helps some however her pain never really resolved. She notes worsening pain as her activity level is increasing in preparation for the upcoming softball season. Her pain is not located at the plantar calcaneus but more along the midportion of the arch. Pain is moderate. Pain is worse with activity.  Past medical history, Surgical history, Family history not pertinant except as noted below, Social history, Allergies, and medications have been entered into the medical record, reviewed, and no changes needed.   Review of Systems: No headache, visual changes, nausea, vomiting, diarrhea, constipation, dizziness, abdominal pain, skin rash, fevers, chills, night sweats, weight loss, swollen lymph nodes, body aches, joint swelling, muscle aches, chest pain, shortness of breath, mood changes, visual or auditory hallucinations.   Objective:    Filed Vitals:   12/29/15 1132  BP: 129/68  Pulse: 69   General: Well Developed, well nourished, and in no acute distress.  Neuro/Psych: Alert and oriented x3, extra-ocular muscles intact, able to move all 4 extremities, sensation grossly intact. Skin: Warm and dry, no rashes noted.  Respiratory: Not using accessory muscles, speaking in full sentences, trachea midline.  Cardiovascular: Pulses palpable, no extremity edema. Abdomen: Does not appear distended. MSK: Feet are normal appearing bilaterally with preserved longitudinal arches. No significant  deformities. Pulses capillary refill sensation and motion are intact. The left foot is mildly tender along the mid arch. The plantar fascia is visible with dorsiflexion of the great toe and the midportion is tender to touch. No palpable deformities are present.  No results found for this or any previous visit (from the past 24 hour(s)). No results found.  Impression and Recommendations:   This case required medical decision making of moderate complexity.

## 2015-12-29 NOTE — Patient Instructions (Signed)
Thank you for coming in today. Do the eccentric heel lift exercises. Remember to go down slowly.  Do the nitroglycerine.  Take meloxicam daily as needed.  Consider some barefoot exercises.  Return in 1 month.   Nitroglycerin Protocol   Apply 1/4 nitroglycerin patch to affected area daily.  Change position of patch within the affected area every 24 hours.  You may experience a headache during the first 1-2 weeks of using the patch, these should subside.  If you experience headaches after beginning nitroglycerin patch treatment, you may take your preferred over the counter pain reliever.  Another side effect of the nitroglycerin patch is skin irritation or rash related to patch adhesive.  Please notify our office if you develop more severe headaches or rash, and stop the patch.  Tendon healing with nitroglycerin patch may require 12 to 24 weeks depending on the extent of injury.  Men should not use if taking Viagra, Cialis, or Levitra.   Do not use if you have migraines or rosacea.    Plantar Fasciitis Plantar fasciitis is a painful foot condition that affects the heel. It occurs when the band of tissue that connects the toes to the heel bone (plantar fascia) becomes irritated. This can happen after exercising too much or doing other repetitive activities (overuse injury). The pain from plantar fasciitis can range from mild irritation to severe pain that makes it difficult for you to walk or move. The pain is usually worse in the morning or after you have been sitting or lying down for a while. CAUSES This condition may be caused by:  Standing for long periods of time.  Wearing shoes that do not fit.  Doing high-impact activities, including running, aerobics, and ballet.  Being overweight.  Having an abnormal way of walking (gait).  Having tight calf muscles.  Having high arches in your feet.  Starting a new athletic activity. SYMPTOMS The main symptom of this  condition is heel pain. Other symptoms include:  Pain that gets worse after activity or exercise.  Pain that is worse in the morning or after resting.  Pain that goes away after you walk for a few minutes. DIAGNOSIS This condition may be diagnosed based on your signs and symptoms. Your health care provider will also do a physical exam to check for:  A tender area on the bottom of your foot.  A high arch in your foot.  Pain when you move your foot.  Difficulty moving your foot. You may also need to have imaging studies to confirm the diagnosis. These can include:  X-rays.  Ultrasound.  MRI. TREATMENT  Treatment for plantar fasciitis depends on the severity of the condition. Your treatment may include:  Rest, ice, and over-the-counter pain medicines to manage your pain.  Exercises to stretch your calves and your plantar fascia.  A splint that holds your foot in a stretched, upward position while you sleep (night splint).  Physical therapy to relieve symptoms and prevent problems in the future.  Cortisone injections to relieve severe pain.  Extracorporeal shock wave therapy (ESWT) to stimulate damaged plantar fascia with electrical impulses. It is often used as a last resort before surgery.  Surgery, if other treatments have not worked after 12 months. HOME CARE INSTRUCTIONS  Take medicines only as directed by your health care provider.  Avoid activities that cause pain.  Roll the bottom of your foot over a bag of ice or a bottle of cold water. Do this for 20 minutes, 3-4  times a day.  Perform simple stretches as directed by your health care provider.  Try wearing athletic shoes with air-sole or gel-sole cushions or soft shoe inserts.  Wear a night splint while sleeping, if directed by your health care provider.  Keep all follow-up appointments with your health care provider. PREVENTION   Do not perform exercises or activities that cause heel pain.  Consider  finding low-impact activities if you continue to have problems.  Lose weight if you need to. The best way to prevent plantar fasciitis is to avoid the activities that aggravate your plantar fascia. SEEK MEDICAL CARE IF:  Your symptoms do not go away after treatment with home care measures.  Your pain gets worse.  Your pain affects your ability to move or do your daily activities.   This information is not intended to replace advice given to you by your health care provider. Make sure you discuss any questions you have with your health care provider.   Document Released: 08/03/2001 Document Revised: 07/30/2015 Document Reviewed: 09/18/2014 Elsevier Interactive Patient Education [employer]

## 2016-01-26 ENCOUNTER — Ambulatory Visit: Payer: PRIVATE HEALTH INSURANCE | Admitting: Family Medicine

## 2016-01-26 ENCOUNTER — Encounter: Payer: Self-pay | Admitting: Family Medicine

## 2016-01-26 ENCOUNTER — Ambulatory Visit (INDEPENDENT_AMBULATORY_CARE_PROVIDER_SITE_OTHER): Payer: PRIVATE HEALTH INSURANCE | Admitting: Family Medicine

## 2016-01-26 VITALS — BP 122/72 | HR 71 | Wt 126.0 lb

## 2016-01-26 DIAGNOSIS — Z9889 Other specified postprocedural states: Secondary | ICD-10-CM | POA: Insufficient documentation

## 2016-01-26 DIAGNOSIS — M722 Plantar fascial fibromatosis: Secondary | ICD-10-CM

## 2016-01-26 DIAGNOSIS — M25562 Pain in left knee: Secondary | ICD-10-CM

## 2016-01-26 MED ORDER — NITROGLYCERIN 0.2 MG/HR TD PT24: MEDICATED_PATCH | TRANSDERMAL | Status: DC

## 2016-01-26 NOTE — Patient Instructions (Signed)
Thank you for coming in today. Work on Special educational needs teacherordic Curls for Hamstring strength.  Work on leg raises and step drills.  Continue nitropatches.  Return in 1 month.

## 2016-01-26 NOTE — Assessment & Plan Note (Signed)
Doing well. Continue nitroglycerin patches. Continue exercises. Recheck in one month

## 2016-01-26 NOTE — Progress Notes (Signed)
       Whitney Turner is a 40 y.o. female who [facility] today for follow-up arch pain and discuss left knee pain and hamstring weakness.  1) patient was seen last month for left-sided arch pain. This is thought to be due to plantar fasciitis her arch strain. She was started on eccentric exercises and nitroglycerin patches. She notes this has helped moderately. She feels much better. She is anxious started using nitroglycerin patch on her right foot as well and a similar mid arch location. She is eager to restart running with a goal of about 10 miles per week.  2) left knee pain: Patient notes intermittent left anterior knee pain with running and with cycling. She has a history of left knee anterior cruciate ligament reconstruction and partial medial meniscectomy. The anterior cruciate ligament reconstruction was used with a hamstring autograft. She denies any knee swelling. Pain typically short-lived following cycling and running for a day or 2.  3) hamstring weakness. Patient notes that she has bilateral hamstring weakness. She has not done any kind of exercises for it. It does not seem to affect her activities much.   Past Medical History  Diagnosis Date  . Allergy   . Herpes   . Heart murmur    Past Surgical History  Procedure Laterality Date  . Hernia repair  1982  . Tonsilectomy/adenoidectomy with myringotomy  2000  . Knee surgery  08/2011   Social History  Substance Use Topics  . Smoking status: Never Smoker   . Smokeless tobacco: Not on file  . Alcohol Use: Not on file   family history includes Alzheimer's disease in her paternal grandmother; Cancer in her maternal grandfather, maternal grandmother, and paternal grandfather; Hyperlipidemia in her father; Hypertension in her father.  ROS as above Medications: Current Outpatient Prescriptions  Medication Sig Dispense  Refill  . acyclovir (ZOVIRAX) 800 MG tablet TAKE 1 TABLET TWICE A DAY FOR 5 DAYS FOR HERPES OUTBREAK 10 tablet 2  . Beclomethasone Dipropionate (QNASL) 80 MCG/ACT AERS Place 2 sprays into both nostrils daily. 1 Inhaler 1  . cetirizine (ZYRTEC) 10 MG tablet Take 10 mg by mouth daily.    . meloxicam (MOBIC) 15 MG tablet Take 1 tablet (15 mg total) by mouth daily as needed for pain. 30 tablet 1  . nitroGLYCERIN (NITRODUR - DOSED IN MG/24 HR) 0.2 mg/hr patch 1/4 patch daily to foot for plantar fasciitis 30 patch 1   No current facility-administered medications for this visit.   No Known Allergies   Exam:  BP 122/72 mmHg  Pulse 71  Wt 126 lb (57.153 kg) Gen: Well NAD Feet are normal appearing bilaterally and nontender. Normal foot motion pulses capillary refill and sensation. Bilateral knees normal-appearing nontender no effusion. Normal motion. Negative McMurray's test. Next when stable ligamentous exam Hips bilaterally are nontender with normal motion. 4/5 hip abduction strength bilaterally. Normal gait  No results found for this or any previous visit (from the past 24 hour(s)). No results found.   Please see individual assessment and plan sections.

## 2016-01-26 NOTE — Assessment & Plan Note (Signed)
Likely patellofemoral pain due to hip abduction and hamstring weakness. Work on hip abduction strengthening and hamstring strengthening. Recheck in one month.

## 2016-01-27 ENCOUNTER — Other Ambulatory Visit: Payer: Self-pay | Admitting: Physician Assistant

## 2016-02-23 ENCOUNTER — Encounter: Payer: Self-pay | Admitting: Family Medicine

## 2016-02-23 ENCOUNTER — Ambulatory Visit (INDEPENDENT_AMBULATORY_CARE_PROVIDER_SITE_OTHER): Payer: PRIVATE HEALTH INSURANCE | Admitting: Family Medicine

## 2016-02-23 ENCOUNTER — Ambulatory Visit (INDEPENDENT_AMBULATORY_CARE_PROVIDER_SITE_OTHER): Payer: PRIVATE HEALTH INSURANCE

## 2016-02-23 VITALS — BP 121/65 | HR 75 | Wt 126.0 lb

## 2016-02-23 DIAGNOSIS — M722 Plantar fascial fibromatosis: Secondary | ICD-10-CM

## 2016-02-23 DIAGNOSIS — M79642 Pain in left hand: Secondary | ICD-10-CM

## 2016-02-23 DIAGNOSIS — M25562 Pain in left knee: Secondary | ICD-10-CM

## 2016-02-23 NOTE — Patient Instructions (Signed)
Thank you for coming in today. Keep up the exercises.  Wean off the patches in the next two months.  Return as needed.  Get xray hand today.   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

## 2016-02-23 NOTE — Assessment & Plan Note (Signed)
Improved with eccentric exercises and nitroglycerin patches. Return as needed

## 2016-02-23 NOTE — Assessment & Plan Note (Signed)
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 X-ray pending. Refer to hand therapy as needed

## 2016-02-23 NOTE — Assessment & Plan Note (Signed)
Improved with hip abduction strengthening. Follow-up as needed

## 2016-02-23 NOTE — Progress Notes (Signed)
       Whitney Turner is a 40 y.o. female who [facility] today for follow-up foot and follow-up knee pain. Discussed new left hand pain.  Patient's been seen for the last 2 months for foot knee pain. She's improved dramatically with both with nitroglycerin patches and eccentric exercises for her plantar fasciitis/arch strain.   Additionally she's improved significantly with hip abduction strengthening for her medial left knee pain.  She feels great and is nearly pain-free. She is able to do her normal activities without pain.  Additionally patient notes left hand pain. This is been ongoing for some time. She notes a bump at the proximal end of the 2nd and 3rd metacarpals. She notes pain with wrist extension and some pain radiating to her fingers. This is worse with the pusher positioning. Symptoms are mild and not terribly bothersome. She is right-hand dominant.   Past Medical History  Diagnosis Date  . Allergy   . Herpes   . Heart murmur    Past Surgical History  Procedure Laterality Date  . Hernia repair  1982  . Tonsilectomy/adenoidectomy with myringotomy  2000  . Knee surgery  08/2011   Social History  Substance Use Topics  . Smoking status: Never Smoker   . Smokeless tobacco: Not on file  . Alcohol Use: Not on file   family history includes Alzheimer's disease in her paternal grandmother; Cancer in her maternal grandfather, maternal grandmother, and paternal grandfather; Hyperlipidemia in her father; Hypertension in her father.  ROS as above Medications: Current Outpatient Prescriptions  Medication Sig Dispense Refill  . acyclovir (ZOVIRAX) 800 MG tablet TAKE 1 TABLET TWICE A DAY FOR 5 DAYS FOR HERPES OUTBREAK 10 tablet 2  . Beclomethasone Dipropionate (QNASL) 80 MCG/ACT AERS Place 2 sprays into both nostrils daily. 1 Inhaler 1  . cetirizine (ZYRTEC) 10 MG tablet  Take 10 mg by mouth daily.    . meloxicam (MOBIC) 15 MG tablet Take 1 tablet (15 mg total) by mouth daily as needed for pain. 30 tablet 1  . nitroGLYCERIN (NITRODUR - DOSED IN MG/24 HR) 0.2 mg/hr patch 1/4 patch daily to foot for plantar fasciitis 30 patch 1   No current facility-administered medications for this visit.   No Known Allergies   Exam:  BP 121/65 mmHg  Pulse 75  Wt 126 lb (57.153 kg) Gen: Well NAD Left knee nontender normal motion Hip abduction strength 5 x 5 bilaterally. Feet are nontender normal appearing Left hand slight busing at the proximal second and third metacarpals. Nontender normal hand and wrist motion. Pulses Capillary refill and sensation are intact.  X-ray left hand pending No results found for this or any previous visit (from the past 24 hour(s)). No results found.   Please see individual assessment and plan sections.

## 2016-02-24 NOTE — Progress Notes (Signed)
Quick Note:  No abnormalities seen in the area of pain. ______

## 2016-03-16 ENCOUNTER — Encounter: Payer: Self-pay | Admitting: Physician Assistant

## 2016-03-16 ENCOUNTER — Ambulatory Visit (INDEPENDENT_AMBULATORY_CARE_PROVIDER_SITE_OTHER): Payer: PRIVATE HEALTH INSURANCE | Admitting: Physician Assistant

## 2016-03-16 VITALS — BP 112/71 | HR 83 | Wt 126.0 lb

## 2016-03-16 DIAGNOSIS — J029 Acute pharyngitis, unspecified: Secondary | ICD-10-CM | POA: Diagnosis not present

## 2016-03-16 LAB — POCT RAPID STREP A (OFFICE): Rapid Strep A Screen: NEGATIVE

## 2016-03-16 MED ORDER — AMOXICILLIN-POT CLAVULANATE 875-125 MG PO TABS: 1.0000 | ORAL_TABLET | Freq: Two times a day (BID) | ORAL | Status: DC

## 2016-03-16 MED ORDER — LIDOCAINE VISCOUS 2 % MT SOLN: 20.0000 mL | OROMUCOSAL | Status: DC | PRN

## 2016-03-16 NOTE — Progress Notes (Signed)
   Subjective:    Patient ID: Whitney Turner, female    DOB: 07-05-1976, 40 y.o.   MRN: [id number]  HPI  Pt presents to the clinic with sore throat that started yesterday. Her daughter came to her this morning with sore throat as well. Her son was seen yesterday for ST and negative strep test. She does not have her tonsils since removed. She takes allergy medications. Saturday her allergies were worse. No fever, chills, cough, ear pain. She has some sinus pressure and congestion.    Review of Systems  All other systems reviewed and are negative.      Objective:   Physical Exam  Constitutional: She is oriented to person, place, and time. She appears well-developed and well-nourished.  HENT:  Head: Normocephalic and atraumatic.  Right Ear: External ear normal.  Left Ear: External ear normal.  Mouth/Throat: No oropharyngeal exudate.  Oropharynx is erythematous. Tonsils removed. Ulcer present on right oropharynx. No exudate.   TM's clear.   Bilateral nasal turbinates red and swollen.   Eyes: Conjunctivae are normal. Right eye exhibits no discharge. Left eye exhibits no discharge.  Neck: Normal range of motion. Neck supple.  Enlarged cervical adenopathy. Non-tender.   Cardiovascular: Normal rate, regular rhythm and normal heart sounds.   Pulmonary/Chest: Effort normal and breath sounds normal. She has no wheezes.  Lymphadenopathy:    She has cervical adenopathy.  Neurological: She is alert and oriented to person, place, and time.  Skin: Skin is dry.  Psychiatric: She has a normal mood and affect. Her behavior is normal.          Assessment & Plan:  Acute pharyngitis- rapid strep negative. There was a ulcer on right side of oropharynx. Lidocaine mouthwash given for symptomatic relief. Appears more viral/allergic. Continue allergy medications. Discussed warm salt water gargles. Ibuprofen can also help. If no improving in next 3 days can start augmentin for 10 days.

## 2016-03-16 NOTE — Patient Instructions (Signed)

## 2016-03-29 ENCOUNTER — Other Ambulatory Visit: Payer: Self-pay | Admitting: Family Medicine

## 2016-05-11 ENCOUNTER — Other Ambulatory Visit: Payer: Self-pay | Admitting: Physician Assistant

## 2016-05-12 ENCOUNTER — Other Ambulatory Visit: Payer: Self-pay | Admitting: Family Medicine

## 2016-07-06 ENCOUNTER — Other Ambulatory Visit: Payer: Self-pay | Admitting: Family Medicine

## 2016-07-09 ENCOUNTER — Other Ambulatory Visit: Payer: Self-pay | Admitting: Physician Assistant

## 2016-09-17 ENCOUNTER — Other Ambulatory Visit: Payer: Self-pay | Admitting: Family Medicine

## 2016-11-02 ENCOUNTER — Other Ambulatory Visit: Payer: Self-pay | Admitting: Family Medicine

## 2016-11-07 ENCOUNTER — Other Ambulatory Visit: Payer: Self-pay | Admitting: Physician Assistant

## 2017-01-05 ENCOUNTER — Ambulatory Visit (INDEPENDENT_AMBULATORY_CARE_PROVIDER_SITE_OTHER): Payer: PRIVATE HEALTH INSURANCE | Admitting: Sports Medicine

## 2017-01-05 ENCOUNTER — Ambulatory Visit: Payer: PRIVATE HEALTH INSURANCE | Admitting: Physician Assistant

## 2017-01-05 ENCOUNTER — Ambulatory Visit (INDEPENDENT_AMBULATORY_CARE_PROVIDER_SITE_OTHER): Payer: PRIVATE HEALTH INSURANCE

## 2017-01-05 DIAGNOSIS — M85832 Other specified disorders of bone density and structure, left forearm: Secondary | ICD-10-CM

## 2017-01-05 DIAGNOSIS — M79642 Pain in left hand: Secondary | ICD-10-CM

## 2017-01-05 NOTE — Progress Notes (Signed)
   Subjective:    I'm seeing this patient as a consultation for:  Whitney GawJade Breeback, PA-C  CC: Left hand pain  HPI: Burgess EstelleYesterday this pleasant 41 year old female accidentally bumped the dorsum of her hand on her sons head, she had immediate pain, slight bruising, with numbness into her pinky finger. Overall it's improving. Symptoms are mild, they're heading in the right direction, no difficulties with use of her hand.  Past medical history:  Negative.  See flowsheet/record as well for more information.  Surgical history: Negative.  See flowsheet/record as well for more information.  Family history: Negative.  See flowsheet/record as well for more information.  Social history: Negative.  See flowsheet/record as well for more information.  Allergies, and medications have been entered into the medical record, reviewed, and no changes needed.   Review of Systems: No headache, visual changes, nausea, vomiting, diarrhea, constipation, dizziness, abdominal pain, skin rash, fevers, chills, night sweats, weight loss, swollen lymph nodes, body aches, joint swelling, muscle aches, chest pain, shortness of breath, mood changes, visual or auditory hallucinations.   Objective:   General: Well Developed, well nourished, and in no acute distress.  Neuro/Psych: Alert and oriented x3, extra-ocular muscles intact, able to move all 4 extremities, sensation grossly intact. Skin: Warm and dry, no rashes noted.  Respiratory: Not using accessory muscles, speaking in full sentences, trachea midline.  Cardiovascular: Pulses palpable, no extremity edema. Abdomen: Does not appear distended. Left hand: Only minimal bruising over the dorsum, there are really no areas of tenderness to palpation, full strength, normal sensation throughout.  X-rays are negative.  Hand was strapped with compressive dressing.  Impression and Recommendations:   This case required medical decision making of moderate complexity.  Left hand  pain I think this just represents a contusion with some ulnar distribution neuropraxia. This only occurred after accidentally banging her hand on her sons head yesterday. Strapped with compressive dressing, meloxicam, x-rays are negative.  Return to see me if no better in 2 weeks.

## 2017-01-05 NOTE — Assessment & Plan Note (Signed)
I think this just represents a contusion with some ulnar distribution neuropraxia. This only occurred after accidentally banging her hand on her sons head yesterday. Strapped with compressive dressing, meloxicam, x-rays are negative.  Return to see me if no better in 2 weeks.

## 2017-01-08 ENCOUNTER — Other Ambulatory Visit: Payer: Self-pay | Admitting: Family Medicine

## 2017-02-09 ENCOUNTER — Other Ambulatory Visit: Payer: Self-pay | Admitting: Physician Assistant

## 2017-03-02 ENCOUNTER — Other Ambulatory Visit: Payer: Self-pay | Admitting: Physician Assistant

## 2017-03-12 ENCOUNTER — Other Ambulatory Visit: Payer: Self-pay | Admitting: Family Medicine

## 2017-04-11 ENCOUNTER — Ambulatory Visit (INDEPENDENT_AMBULATORY_CARE_PROVIDER_SITE_OTHER): Payer: PRIVATE HEALTH INSURANCE | Admitting: Physician Assistant

## 2017-04-11 ENCOUNTER — Encounter: Payer: Self-pay | Admitting: Physician Assistant

## 2017-04-11 VITALS — BP 114/73 | HR 71 | Ht 61.5 in | Wt 127.0 lb

## 2017-04-11 DIAGNOSIS — J0140 Acute pansinusitis, unspecified: Secondary | ICD-10-CM | POA: Diagnosis not present

## 2017-04-11 MED ORDER — AZITHROMYCIN 250 MG PO TABS: ORAL_TABLET | ORAL | 0 refills | Status: DC

## 2017-04-11 NOTE — Progress Notes (Signed)
   Subjective:    Patient ID: Whitney Turner, female    DOB: [birth year], 41 y.o.   MRN: [id number]  HPI Pt is a 41 yo female who presents to the clinic with over 1 week of sinus pressure, cough, nasal congestion. She has been taking tylenol cold and sinus. It helps but the symptoms come back. She is not fatigued and noticing some chest tightness. She has hx of some wheezing but no known asthma. Her son is also sick with similar symptoms. No fever, chills, body aches.    Review of Systems    see HPI.  Objective:   Physical Exam  Constitutional: She is oriented to person, place, and time. She appears well-developed and well-nourished.  HENT:  Head: Normocephalic and atraumatic.  Right Ear: External ear normal.  Left Ear: External ear normal.  Nose: Nose normal.  TM's clear.  Tenderness over maxillary and frontal sinuses.  Oropharynx erythematous without any exudate.   Eyes: Conjunctivae are normal. Right eye exhibits no discharge. Left eye exhibits no discharge.  Neck: Normal range of motion. Neck supple.  Cardiovascular: Normal rate, regular rhythm and normal heart sounds.   Pulmonary/Chest: Effort normal and breath sounds normal. She has no wheezes.  Lymphadenopathy:    She has no cervical adenopathy.  Neurological: She is alert and oriented to person, place, and time.  Psychiatric: She has a normal mood and affect. Her behavior is normal.          Assessment & Plan:  Marland Kitchen.Marland Kitchen.Whitney Turner was seen today for cough.  Diagnoses and all orders for this visit:  Acute non-recurrent pansinusitis  Other orders -     azithromycin (ZITHROMAX) 250 MG tablet; Take 2 tablets now and then one tablet for 4 days.   Continue with symptomatic care. Start abx due to symptoms being persistent for over a week. Follow up as needed.

## 2017-04-11 NOTE — Patient Instructions (Signed)

## 2017-05-07 ENCOUNTER — Other Ambulatory Visit: Payer: Self-pay | Admitting: Family Medicine

## 2017-05-09 ENCOUNTER — Other Ambulatory Visit: Payer: Self-pay | Admitting: Physician Assistant

## 2017-07-04 ENCOUNTER — Other Ambulatory Visit: Payer: Self-pay | Admitting: Family Medicine

## 2017-07-14 ENCOUNTER — Ambulatory Visit (INDEPENDENT_AMBULATORY_CARE_PROVIDER_SITE_OTHER): Payer: PRIVATE HEALTH INSURANCE | Admitting: Family Medicine

## 2017-07-14 ENCOUNTER — Encounter: Payer: Self-pay | Admitting: Family Medicine

## 2017-07-14 VITALS — BP 97/62 | HR 84 | Wt 126.0 lb

## 2017-07-14 DIAGNOSIS — M222X2 Patellofemoral disorders, left knee: Secondary | ICD-10-CM

## 2017-07-14 MED ORDER — DICLOFENAC SODIUM 1 % TD GEL: 4.0000 g | Freq: Four times a day (QID) | TRANSDERMAL | 11 refills | Status: DC

## 2017-07-14 NOTE — Progress Notes (Signed)
Whitney Turner is a 41 y.o. female who presents to [facility] today for left knee pain. Patient had a remote history of anterior cruciate ligament tear about 10 years ago. She had surgical reconstruction and meniscus debridement in 2011. She did well and was asymptomatic until a few months ago. She notes anterior and lateral knee pain. She denies locking or catching but does not crepitations. She denies fevers or chills nausea vomiting or diarrhea. She's tried some over-the-counter pain which has helped that. The pain is worse with climbing stairs and better with rest. The pain is moderate and interferes with her ability to exercise normally.   Past Medical History:  Diagnosis Date  . Allergy   . Heart murmur   . Herpes    Past Surgical History:  Procedure Laterality Date  . HERNIA REPAIR  1982  . KNEE SURGERY  08/2011  . TONSILECTOMY/ADENOIDECTOMY WITH MYRINGOTOMY  2000   Social History  Substance Use Topics  . Smoking status: Never Smoker  . Smokeless tobacco: Never Used  . Alcohol use Not on file     ROS:  As above   Medications: Current Outpatient Prescriptions  Medication Sig Dispense Refill  . acyclovir (ZOVIRAX) 800 MG tablet TAKE 1 TABLET TWICE A DAY FOR 5 DAYS FOR HERPES OUTBREAK 10 tablet 1  . Beclomethasone Dipropionate (QNASL) 80 MCG/ACT AERS Place 2 sprays into both nostrils daily. 1 Inhaler 1  . cetirizine (ZYRTEC) 10 MG tablet Take 10 mg by mouth daily.    . meloxicam (MOBIC) 15 MG tablet TAKE 1 TABLET (15 MG TOTAL) BY MOUTH DAILY AS NEEDED FOR PAIN. 30 tablet 1  . diclofenac sodium (VOLTAREN) 1 % GEL Apply 4 g topically 4 (four) times daily. To affected joint. 100 g 11   No current facility-administered medications for this visit.    No Known Allergies   Exam:  BP 97/62   Pulse 84   Wt 126 lb (57.2 kg)   BMI 23.42 kg/m  General: Well Developed, well nourished, and in no acute distress.  Neuro/Psych: Alert  and oriented x3, extra-ocular muscles intact, able to move all 4 extremities, sensation grossly intact. Skin: Warm and dry, no rashes noted.  Respiratory: Not using accessory muscles, speaking in full sentences, trachea midline.  Cardiovascular: Pulses palpable, no extremity edema. Abdomen: Does not appear distended. MSK:  Left knee well-appearing with no erythema or effusions. Decreased VMO bulk compared to contralateral right side Range of motion 0-120 with no significant crepitations. Mildly tender to palpation at the lateral femoral condyle near  the IT band Nontender along the joint lines. Stable to anterior and posterior drawer testing as well as valgus and varus stress. Negative McMurray's test. Intact flexion and extension strength testing.  Left hip normal-appearing normal range of motion nontender. Decreased hip abduction strength compared to contralateral right side 4/5 versus 5/5    No results found for this or any previous visit (from the past 48 hour(s)). No results found.    Assessment and Plan: 41 y.o. female with left knee pain likely a combination of patellar tendinitis or patellofemoral pain syndrome as well as distal IT band syndrome. Relative weak VMO and hip abductors are contributing here. Land for home exercise program referral to physical therapy as well as prescription diclofenac gel. We'll obtain x-rays are not improving.  Recheck in 6 weeks.  Orders Placed This Encounter  Procedures  . Ambulatory referral to Physical Therapy    Referral Priority:  Routine    Referral Type:   Physical Medicine    Referral Reason:   Specialty Services Required    Requested Specialty:   Physical Therapy   Meds ordered this encounter  Medications  . diclofenac sodium (VOLTAREN) 1 % GEL    Sig: Apply 4 g topically 4 (four) times daily. To affected joint.    Dispense:  100 g    Refill:  11    Discussed warning signs or symptoms. Please see discharge instructions.  Patient expresses understanding.  I spent 25 minutes with this patient, greater than 50% was face-to-face time counseling regarding likely cause of pain as well as treatment plan and backup plan.Marland Kitchen

## 2017-07-14 NOTE — Patient Instructions (Addendum)
Thank you for coming in today.  Use the cream Attend PT.  Do the exercises we discussed.  Recheck in 6 weeks.

## 2017-07-20 ENCOUNTER — Ambulatory Visit (INDEPENDENT_AMBULATORY_CARE_PROVIDER_SITE_OTHER): Payer: PRIVATE HEALTH INSURANCE | Admitting: Physical Therapy

## 2017-07-20 ENCOUNTER — Encounter: Payer: Self-pay | Admitting: Physical Therapy

## 2017-07-20 DIAGNOSIS — M25562 Pain in left knee: Secondary | ICD-10-CM | POA: Diagnosis not present

## 2017-07-20 DIAGNOSIS — M6281 Muscle weakness (generalized): Secondary | ICD-10-CM

## 2017-07-20 DIAGNOSIS — R293 Abnormal posture: Secondary | ICD-10-CM | POA: Diagnosis not present

## 2017-07-20 NOTE — Therapy (Signed)
[facility] [address] [place], [place], [address] Phone: [phone]   Fax:  [phone]  Physical Therapy Evaluation  Patient Details  Name: Whitney Turner MRN: [id number] Date of Birth: [birth year] Referring Provider: Dr Teressa LowerE Corey  Encounter Date: 07/20/2017      PT End of Session - 07/20/17 1103    Visit Number 1   Number of Visits 6   Date for PT Re-Evaluation 08/31/17   PT Start Time 1103   PT Stop Time 1209   PT Time Calculation (min) 66 min   Activity Tolerance Patient tolerated treatment well      Past Medical History:  Diagnosis Date  . Allergy   . Heart murmur   . Herpes     Past Surgical History:  Procedure Laterality Date  . HERNIA REPAIR  1982  . KNEE SURGERY  08/2011  . TONSILECTOMY/ADENOIDECTOMY WITH MYRINGOTOMY  2000    There were no vitals filed for this visit.       Subjective Assessment - 07/20/17 1103    Subjective Pt reports her current issue is pain with ascending stairs and squating.  She tries to stay active. Lt knee will freeze up on her sometimes, about once a week currently. During school goes to the gym 3-4 times a week.  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 Another concern is since this spring she has had limited ROM after participating in a spin class. OK with running, occassionally will have pain the day after ( performs about 2 miles)    Pertinent History 2011 Lt ACL and meniscus repair - had PT  - focused on hamstring because they used a graft from this.   4 yr h/o bilat plantar fascitis - has had CAM boot, cortisone injections from previous MD and then exercise from current MD.  Tillie Rungries to stretch at home and has a foam roller.    How long can you walk comfortably? no limitations.    Diagnostic tests x-ray - arthritis in the knee.    Patient Stated Goals get her Lt LE stronger, learn what she  should or should not do at home and the gym. Perform stairs and her job ( pushing off the leg without pain )    Currently in Pain? No/denies  pain with stairs, sometimes achy when in the car.             [facility] PT Assessment - 07/20/17 0001      Assessment   Medical Diagnosis Lt patellofemoral pain   Referring Provider Dr Teressa LowerE Corey   Onset Date/Surgical Date 05/06/17   Hand Dominance Right   Next MD Visit 08/25/17   Prior Therapy after ACL surgery 2011     Precautions   Precautions None   Required Braces or Orthoses --  has orthotics in her shoes.      Balance Screen   Has the patient fallen in the past 6 months No     Home Environment   Living Environment Private residence   Living Arrangements Spouse/significant other;Children   Home Layout Two level  pain up stairs     Prior Function   Level of Independence Independent   Vocation Full time employment   Vocation Requirements sports umpire   Leisure exercise     Observation/Other Assessments   Focus on Therapeutic Outcomes (FOTO)  64% limited  Functional Tests   Functional tests Squat;Step down;Single leg stance     Squat   Comments shift to the RT     Step Down   Comments fair eccentric control Rt quad, poor on the Lt with knee pain.      Single Leg Stance   Comments bilat WNL with shoes on     Posture/Postural Control   Posture/Postural Control Postural limitations   Postural Limitations --  slight LE valgus, has inserts in her shoes     ROM / Strength   AROM / PROM / Strength AROM;Strength     AROM   AROM Assessment Site Knee;Ankle   Right/Left Knee Left;Right   Right Knee Flexion 148   Left Knee Extension 0   Left Knee Flexion 140   Right/Left Ankle --  DF bilat 12-15 degrees     Strength   Strength Assessment Site Hip;Knee;Ankle   Right/Left Hip Left   Left Hip Flexion --  5-/5   Left Hip Extension 4/5   Left Hip ABduction 4-/5   Right/Left Knee --  WNL with break test, weak with  eccentric and functional task   Right/Left Ankle --  Rt WNL, Lt eversion 4+/5     Flexibility   Soft Tissue Assessment /Muscle Length yes   Quadriceps supine knee flex Rt 152, Lt 148   ITB Lt TFL tight and tender      Palpation   Palpation comment tight and tender in Lt hip adductors and ITB, tightness in mid and medial l Lt grastroc             Objective measurements completed on examination: See above findings.          [facility] Adult PT Treatment/Exercise - 07/20/17 0001      Exercises   Exercises Knee/Hip     Knee/Hip Exercises: Stretches   ITB Stretch Both;2 reps  cross body with strap     Knee/Hip Exercises: Supine   Single Leg Bridge Strengthening;Both;3 sets;10 reps  figure 4   Straight Leg Raise with External Rotation Strengthening;Left;3 sets;15 reps  in long sit     Knee/Hip Exercises: Sidelying   Other Sidelying Knee/Hip Exercises 10 reps each pilates FWD/BWD kicks, CW/CCW circle,                PT Education - 07/20/17 1152    Education provided Yes   Education Details HEP    Person(s) Educated Patient   Methods Explanation;Handout   Comprehension Returned demonstration;Verbalized understanding             PT Long Term Goals - 07/20/17 1225      PT LONG TERM GOAL #1   Title I with advance HEP ( 08/31/17)    Time 6   Period Weeks   Status New     PT LONG TERM GOAL #2   Title report =/< 75% improvement in Lt knee pain with ascending stairs and umpiring. ( 08/31/17)    Time 6   Period Weeks   Status New     PT LONG TERM GOAL #3   Title demo good Lt quad control and alignment with eccentric work ( 08/31/17)    Time 6   Period Weeks   Status New     PT LONG TERM GOAL #4   Title demo Lt LE strength 5/5 ( 08/31/17)    Time 6   Status New     PT LONG TERM GOAL #5   Title improve  FOTO =/< 404 limited ( 08/31/17)    Time 6   Period Weeks   Status New                Plan - 07/20/17 1220    Clinical Impression  Statement 41 yo very active female presents with c/o Lt knee pain with functional activities. She presents with generalized weakness, abnormal LE alignment/posture with functional motion and pain. She is performng some exercise on her own, however has pain with stairs and pusing off the Lt LE with work.    History and Personal Factors relevant to plan of care: 2011 LT ACL repair and medial meniscus repair, bilat plantar fascitis.    Clinical Presentation Stable   Clinical Decision Making Low   Rehab Potential Excellent   PT Frequency 1x / week   PT Duration 6 weeks   PT Treatment/Interventions Taping;Vasopneumatic Device;Manual techniques;Neuromuscular re-education;Therapeutic exercise;Electrical Stimulation;Cryotherapy;Iontophoresis 4mg /ml Dexamethasone;Patient/family education   PT Next Visit Plan progress LE strengthening proprioception   Consulted and Agree with Plan of Care Patient      Patient will benefit from skilled therapeutic intervention in order to improve the following deficits and impairments:  Pain, Decreased strength, Postural dysfunction  Visit Diagnosis: Acute pain of left knee - Plan: PT plan of care cert/re-cert  Muscle weakness (generalized) - Plan: PT plan of care cert/re-cert  Abnormal posture - Plan: PT plan of care cert/re-cert     Problem List Patient Active Problem List   Diagnosis Date Noted  . Left hand pain 02/23/2016  . Left anterior knee pain 01/26/2016  . Plantar fasciitis, bilateral 12/29/2015  . Genital herpes 12/01/2012  . 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 Allergic rhinitis 12/01/2012  . Seasonal allergies 12/01/2012    Roderic Scarce PT 07/20/2017, 12:33 PM  [facility] [address] [place] [address] [place], [place], [address] Phone: [phone]   Fax:  [phone]  Name: Romey Cohea MRN: [id number] Date of Birth: [birth year]

## 2017-07-20 NOTE — Patient Instructions (Addendum)
Strengthening: Straight Leg Raise (Phase 3) - perform these slowly.     Resting on hands, tighten muscles on front of left thigh, turn the foot out ~ 45 degrees, then lift leg __8-10__ inches from surface, keeping knee locked. Repeat _15___ times per set. Do ___3_ sets per session. Do __1__ sessions per day. Progression: lift up, out to side, back in, then down.    Side Kick - keep top leg level with your body. Don't let it drop down.     Lie on side, back straight along edge of mat, legs 30 in front of torso. Lift top leg to hip height, foot flexed. Exhale, kicking forward twice. Inhale, kicking backward twice with pointed foot. Keep leg hip height, torso still. Repeat _10-30___ times. Repeat on other side. Do __1__ sessions per day.   Side Leg Circle    Lie on side, back straight along edge of mat, legs 30 in front of torso. Lift top leg to hip height. Rotate in small circle, __10__ times in each direction. Repeat _1-3___ sets. Repeat on other side. Do _1___ sessions per day.  Push through the heel of the bent leg. Build up to sets of 15-20   Outer Hip Stretch: Reclined IT Band Stretch (Strap)    Strap around opposite foot, pull across only as far as possible with shoulders on mat. Hold for __30-45__ secs. Repeat __2__ times each leg.

## 2017-07-27 ENCOUNTER — Ambulatory Visit (INDEPENDENT_AMBULATORY_CARE_PROVIDER_SITE_OTHER): Payer: PRIVATE HEALTH INSURANCE | Admitting: Physical Therapy

## 2017-07-27 ENCOUNTER — Encounter: Payer: Self-pay | Admitting: Physical Therapy

## 2017-07-27 ENCOUNTER — Ambulatory Visit: Payer: PRIVATE HEALTH INSURANCE | Admitting: Physical Therapy

## 2017-07-27 DIAGNOSIS — R293 Abnormal posture: Secondary | ICD-10-CM

## 2017-07-27 DIAGNOSIS — M25562 Pain in left knee: Secondary | ICD-10-CM

## 2017-07-27 DIAGNOSIS — M6281 Muscle weakness (generalized): Secondary | ICD-10-CM | POA: Diagnosis not present

## 2017-07-27 NOTE — Patient Instructions (Addendum)
  Balance / Reach    Stand on left foot, Holding __0__ pound weight in other hand. Bend knee, lowering body, and reach across. Hold __1__ seconds. Relax. Repeat _10__ times per set. Do _2-3___ sets per session. Do __4__ sessions per week.

## 2017-07-27 NOTE — Therapy (Signed)
[place], [place], [address] Phone: [phone]   Fax:  [phone]  Physical Therapy Treatment  Patient Details  Name: Whitney Turner MRN: [id number] Date of Birth: 1975-12-05 Referring Provider: Dr Georgina Snell  Encounter Date: 07/27/2017      PT End of Session - 07/27/17 0931    Visit Number 2   Number of Visits 6   Date for PT Re-Evaluation 08/31/17   PT Start Time 0932   PT Stop Time 1016   PT Time Calculation (min) 44 min   Activity Tolerance Patient tolerated treatment well      Past Medical History:  Diagnosis Date  . Allergy   . Heart murmur   . Herpes     Past Surgical History:  Procedure Laterality Date  . HERNIA REPAIR  1982  . KNEE SURGERY  08/2011  . TONSILECTOMY/ADENOIDECTOMY WITH MYRINGOTOMY  2000    There were no vitals filed for this visit.      Subjective Assessment - 07/27/17 0932    Subjective Pt reports she doesn't think her knee is doing good, She has run and/or cycled everyday and was active with her family.  She has done her HEP most days.  She is wondering if she is doing to much.    Patient Stated Goals get her Lt LE stronger, learn what she should or should not do at home and the gym. Perform stairs and her job ( pushing off the leg without pain )    Currently in Pain? No/denies            [facility] PT Assessment - 07/27/17 0001      Assessment   Medical Diagnosis Lt patellofemoral pain   Referring Provider Dr Georgina Snell   Onset Date/Surgical Date 05/06/17     Strength   Right/Left Hip Left   Left Hip Extension 4+/5   Left Hip ABduction 4+/5                     [facility] Adult PT Treatment/Exercise - 07/27/17 0001      Knee/Hip Exercises: Stretches   Active Hamstring Stretch Both;30 seconds  FWD lean   ITB Stretch Both;30 seconds  FWD lean     Knee/Hip Exercises: Standing   Forward Lunges Both;3 sets;10 reps  half kneel to stand single leg.     SLS 3x10 with FEWD lean   Other Standing Knee Exercises 5x30 sec warrior pose each side.      Knee/Hip Exercises: Seated   Sit to Sand 10 reps;3 sets;without UE support  single leg, each side     Manual Therapy   Manual Therapy Taping   Kinesiotex Facilitate Muscle;Ligament Correction     Kinesiotix   Facilitate Muscle  dynamic tape   Ligament Correction lateral tracking correction Lt knee                PT Education - 07/27/17 0954    Education provided Yes   Education Details HEP progression   Person(s) Educated Patient   Methods Demonstration;Explanation;Handout   Comprehension Returned demonstration;Verbalized understanding             PT Long Term Goals - 07/27/17 0956      PT LONG TERM GOAL #1   Title I with advance HEP ( 08/31/17)    Status On-going     PT LONG TERM GOAL #2   Title report =/< 75% improvement in Lt knee pain with ascending  stairs and umpiring. ( 08/31/17)    Status On-going     PT LONG TERM GOAL #3   Title demo good Lt quad control and alignment with eccentric work ( 08/31/17)    Status On-going     PT LONG TERM GOAL #4   Title demo Lt LE strength 5/5 ( 08/31/17)    Status On-going     PT LONG TERM GOAL #5   Title improve FOTO =/< 40% limited ( 08/31/17)    Status On-going               Plan - 07/27/17 1005    Clinical Impression Statement This is Whitney Turner's second visit, no goals met yet.  She is concerned that she still has pain however she has only been doing the exercise for a week.  Her Lt hip strenght has improved some.  She had a decrease in pain with the dynamic tape to correct the Lt patella tracking.    Rehab Potential Excellent   PT Frequency 1x / week   PT Duration 6 weeks   PT Treatment/Interventions Taping;Vasopneumatic Device;Manual techniques;Neuromuscular re-education;Therapeutic exercise;Electrical Stimulation;Cryotherapy;Iontophoresis 46m/ml Dexamethasone;Patient/family education   PT Next Visit Plan  progress LE strengthening proprioception   Consulted and Agree with Plan of Care Patient      Patient will benefit from skilled therapeutic intervention in order to improve the following deficits and impairments:  Pain, Decreased strength, Postural dysfunction  Visit Diagnosis: Acute pain of left knee  Muscle weakness (generalized)  Abnormal posture     Problem List Patient Active Problem List   Diagnosis Date Noted  . Left hand pain 02/23/2016  . Left anterior knee pain 01/26/2016  . Plantar fasciitis, bilateral 12/29/2015  . Genital herpes 12/01/2012  . 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 Allergic rhinitis 12/01/2012  . Seasonal allergies 12/01/2012    SJeral PinchPT  07/27/2017, 10:13 AM  [facility] [address]Phone: [phone]  Fax:  [phone] Name: Whitney McguffinMRN: [id number] of Birth: 127-May-1977

## 2017-08-03 ENCOUNTER — Ambulatory Visit (INDEPENDENT_AMBULATORY_CARE_PROVIDER_SITE_OTHER): Payer: PRIVATE HEALTH INSURANCE | Admitting: Physical Therapy

## 2017-08-03 DIAGNOSIS — M25562 Pain in left knee: Secondary | ICD-10-CM

## 2017-08-03 DIAGNOSIS — R293 Abnormal posture: Secondary | ICD-10-CM

## 2017-08-03 DIAGNOSIS — M6281 Muscle weakness (generalized): Secondary | ICD-10-CM

## 2017-08-03 NOTE — Therapy (Signed)
[facility] [place] [address] [place] [address] [place], [place], [address] Phone: [phone]   Fax:  [phone]  Physical Therapy Treatment  Patient Details  Name: Whitney Turner MRN: [id number] Date of Birth: [birth year] Referring Provider: Dr Denyse Amass  Encounter Date: 08/03/2017      PT End of Session - 08/03/17 0803    Visit Number 3   Number of Visits 6   Date for PT Re-Evaluation 08/31/17   PT Start Time 0803   PT Stop Time 0847   PT Time Calculation (min) 44 min   Activity Tolerance Patient tolerated treatment well      Past Medical History:  Diagnosis Date  . Allergy   . Heart murmur   . Herpes     Past Surgical History:  Procedure Laterality Date  . HERNIA REPAIR  1982  . KNEE SURGERY  08/2011  . TONSILECTOMY/ADENOIDECTOMY WITH MYRINGOTOMY  2000    There were no vitals filed for this visit.      Subjective Assessment - 08/03/17 0805    Subjective Gavin reports the tape helps.  Came in yesterday and asked to have it reapplied. This was done by our PTA.     Patient Stated Goals get her Lt LE stronger, learn what she should or should not do at home and the gym. Perform stairs and her job ( pushing off the leg without pain )    Currently in Pain? No/denies            [facility] PT Assessment - 08/03/17 0001      Assessment   Medical Diagnosis Lt patellofemoral pain   Referring Provider Dr Denyse Amass   Onset Date/Surgical Date 05/06/17     Strength   Right/Left Hip Left                     [facility] Adult PT Treatment/Exercise - 08/03/17 0001      Knee/Hip Exercises: Stretches   Active Hamstring Stretch Both;30 seconds   Quad Stretch Both;30 seconds;3 reps   ITB Stretch Both;30 seconds     Knee/Hip Exercises: Aerobic   Elliptical L3x5'  having some knee popping with it, no pain     Knee/Hip Exercises: Standing   Forward Lunges Both;3 sets;15 reps  curtsey lunge off 13" step   Wall Squat 10 reps  15 sec  holds, VC for form   Other Standing Knee Exercises reviewed single leg sit to stand and form each side.      Knee/Hip Exercises: Supine   Bridges Limitations 3x10 bridge with hamstring curls, feet on ball 3x10     Knee/Hip Exercises: Sidelying   Hip ADduction Strengthening;15 reps;3 sets;Both  with top leg on chair                     PT Long Term Goals - 08/03/17 0816      PT LONG TERM GOAL #1   Title I with advance HEP ( 08/31/17)    Status On-going     PT LONG TERM GOAL #2   Title report =/< 75% improvement in Lt knee pain with ascending stairs and umpiring. ( 08/31/17)    Status On-going  with tape on pt doesn't have pain     PT LONG TERM GOAL #3   Title demo good Lt quad control and alignment with eccentric work ( 08/31/17)    Status On-going     PT LONG TERM GOAL #4   Title demo  Lt LE strength 5/5 ( 08/31/17)      PT LONG TERM GOAL #5   Title improve FOTO =/< 40% limited ( 08/31/17)    Status On-going               Plan - 08/03/17 0849    Clinical Impression Statement Eun is making progress to her goals.  Having great responses to the tape.  She fatigues with higher level LE work however is getting stronger.     Rehab Potential Excellent   PT Frequency 1x / week   PT Duration 6 weeks   PT Treatment/Interventions Taping;Vasopneumatic Device;Manual techniques;Neuromuscular re-education;Therapeutic exercise;Electrical Stimulation;Cryotherapy;Iontophoresis 4mg /ml Dexamethasone;Patient/family education   PT Next Visit Plan FOTO, reassess and teach self taping   Consulted and Agree with Plan of Care Patient      Patient will benefit from skilled therapeutic intervention in order to improve the following deficits and impairments:  Pain, Decreased strength, Postural dysfunction  Visit Diagnosis: Acute pain of left knee  Muscle weakness (generalized)  Abnormal posture     Problem List Patient Active Problem List   Diagnosis Date Noted   . Left hand pain 02/23/2016  . Left anterior knee pain 01/26/2016  . Plantar fasciitis, bilateral 12/29/2015  . Genital herpes 12/01/2012  . 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 Allergic rhinitis 12/01/2012  . Seasonal allergies 12/01/2012    Roderic ScarceSusan Genice Kimberlin PT  08/03/2017, 8:53 AM  [facility] [address] [place] [address] [place], [place], [address] Phone: [phone]   Fax:  [phone]  Name: Raynelle Dickmber Lamica MRN: [id number] Date of Birth: 10/24/1976

## 2017-08-10 ENCOUNTER — Ambulatory Visit (INDEPENDENT_AMBULATORY_CARE_PROVIDER_SITE_OTHER): Payer: PRIVATE HEALTH INSURANCE | Admitting: Physical Therapy

## 2017-08-10 DIAGNOSIS — M25562 Pain in left knee: Secondary | ICD-10-CM

## 2017-08-10 DIAGNOSIS — R293 Abnormal posture: Secondary | ICD-10-CM | POA: Diagnosis not present

## 2017-08-10 DIAGNOSIS — M6281 Muscle weakness (generalized): Secondary | ICD-10-CM | POA: Diagnosis not present

## 2017-08-10 NOTE — Patient Instructions (Addendum)
Cut one strip ~ 4-5" long, cut up the middle on each side leaving the tape intact in the middle.  This section is to be on the knee cap.  Tear paper on each little wing.  Remove tape from the middle section, this part is place on the outside of the knee cap with no tension.    There should be no tension on the end of each piece of tape for about an 1"   Apply about 50% tension on the tape, pull outside piece up straight , inside piece should form a C around the knee cap.    Cross strap piece should come across the top of the knee cap pulling it in, about an inch should be on the skin on outside of knee.

## 2017-08-10 NOTE — Therapy (Signed)
[place], [place], [address] Phone: [phone]   Fax:  [phone]  Physical Therapy Treatment  Patient Details  Name: Whitney Turner MRN: [id number] Date of Birth: Mar 09, 1976 Referring Provider: Dr Steva Colder  Encounter Date: 08/10/2017      PT End of Session - 08/10/17 0854    Visit Number 4   Number of Visits 6   Date for PT Re-Evaluation 08/31/17   PT Start Time 8676   PT Stop Time 0933   PT Time Calculation (min) 39 min   Activity Tolerance Patient tolerated treatment well      Past Medical History:  Diagnosis Date  . Allergy   . Heart murmur   . Herpes     Past Surgical History:  Procedure Laterality Date  . HERNIA REPAIR  1982  . KNEE SURGERY  08/2011  . TONSILECTOMY/ADENOIDECTOMY WITH MYRINGOTOMY  2000    There were no vitals filed for this visit.      Subjective Assessment - 08/10/17 0855    Subjective [place] ran 1-2 miles and didn't have any problems.  The tape was on for this. Still small issue on stairs without tape.    Patient Stated Goals get her Lt LE stronger, learn what she should or should not do at home and the gym. Perform stairs and her job ( pushing off the leg without pain )    Currently in Pain? No/denies            [facility] PT Assessment - 08/10/17 0001      Assessment   Medical Diagnosis Lt patellofemoral pain   Referring Provider Dr Steva Colder     Observation/Other Assessments   Focus on Therapeutic Outcomes (FOTO)  36% limited     AROM   AROM Assessment Site Knee   Left Knee Flexion 135  pt reports she has felt like she may have some swelling jt     Strength   Left Hip Flexion 5/5   Left Hip Extension 5/5   Left Hip ABduction 5/5                     [facility] Adult PT Treatment/Exercise - 08/10/17 0001      Self-Care   Self-Care Other Self-Care Comments   Other Self-Care Comments  self taping for lateral tracking to knee today. VC's  for techiniqu     Knee/Hip Exercises: Prone   Hamstring Curl 2 sets;10 reps   Hamstring Curl Limitations quadruped with green band   Hip Extension Strengthening;Both;2 sets;10 reps   Hip Extension Limitations quadruped with blue band, VCs for form     [employer]   Facilitate Muscle  dynamic tape   Ligament Correction lateral tracking correction Lt knee                PT Education - 08/10/17 0926    Education provided Yes   Education Details tape application and strengthening   Person(s) Educated Patient   Methods Explanation;Demonstration;Handout   Comprehension Returned demonstration;Verbalized understanding             PT Long Term Goals - 08/10/17 0916      PT LONG TERM GOAL #1   Title I with advance HEP ( 08/31/17)    Status Achieved     PT LONG TERM GOAL #2   Title report =/< 75% improvement in Lt knee pain with ascending stairs and umpiring. ( 08/31/17)    Status  Achieved     PT LONG TERM GOAL #3   Title demo good Lt quad control and alignment with eccentric work ( 08/31/17)    Status Achieved     PT LONG TERM GOAL #4   Title demo Lt LE strength 5/5 ( 08/31/17)    Status Achieved     PT LONG TERM GOAL #5   Title improve FOTO =/< 40% limited ( 08/31/17)    Status Achieved  36% limited               Plan - 08/10/17 1128    Clinical Impression Statement Leighanne is doing very well. She was able to self tape after instruction for lateral patellar tracking.  She had difficulty with maintaining form with todays exercise and did better with less resistance. She is running again, pleased with her progress, met all her goals and ready for discharge.    PT Next Visit Plan dishcarge to HEP       Patient will benefit from skilled therapeutic intervention in order to improve the following deficits and impairments:     Visit Diagnosis: Acute pain of left knee  Muscle weakness (generalized)  Abnormal posture     Problem List Patient Active  Problem List   Diagnosis Date Noted  . Left hand pain 02/23/2016  . Left anterior knee pain 01/26/2016  . Plantar fasciitis, bilateral 12/29/2015  . Genital herpes 12/01/2012  . 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 Allergic rhinitis 12/01/2012  . Seasonal allergies 12/01/2012    SHAVER,SUE 08/10/2017, 11:31 AM  [facility] [place], [place], [address] Phone: [phone]   Fax:  [phone]  Name: Wilson Sample MRN: [id number] Date of Birth: [birth year]   PHYSICAL THERAPY DISCHARGE SUMMARY  Visits from Start of Care: 4 Current functional level related to goals / functional outcomes: See above   Remaining deficits: none   Education / Equipment: HEP and taping  Plan: Patient agrees to discharge.  Patient goals were met. Patient is being discharged due to meeting the stated rehab goals.  ?????    Jeral Pinch, PT 08/10/17 11:32 AM

## 2017-08-24 ENCOUNTER — Encounter: Payer: Self-pay | Admitting: Physician Assistant

## 2017-08-24 ENCOUNTER — Ambulatory Visit (INDEPENDENT_AMBULATORY_CARE_PROVIDER_SITE_OTHER): Payer: PRIVATE HEALTH INSURANCE | Admitting: Physician Assistant

## 2017-08-24 VITALS — BP 116/66 | HR 73 | Ht 61.5 in | Wt 127.0 lb

## 2017-08-24 DIAGNOSIS — L03011 Cellulitis of right finger: Secondary | ICD-10-CM

## 2017-08-24 MED ORDER — AMOXICILLIN-POT CLAVULANATE 875-125 MG PO TABS: 1.0000 | ORAL_TABLET | Freq: Two times a day (BID) | ORAL | 0 refills | Status: DC

## 2017-08-24 NOTE — Patient Instructions (Signed)
Paronychia  Paronychia is an infection of the skin. It happens near a fingernail or toenail. It may cause pain and swelling around the nail. Usually, it is not serious and it clears up with treatment.  Follow these instructions at home:   Soak the fingers or toes in warm water as told by your doctor. You may be told to do this for 20 minutes, 2-3 times a day.   Keep the area dry when you are not soaking it.   Take medicines only as told by your doctor.   If you were given an antibiotic medicine, finish all of it even if you start to feel better.   Keep the affected area clean.   Do not try to drain a fluid-filled bump yourself.   Wear rubber gloves when putting your hands in water.   Wear gloves if your hands might touch cleaners or chemicals.   Follow your doctor's instructions about:  ? Wound care.  ? Bandage (dressing) changes and removal.  Contact a doctor if:   Your symptoms get worse or do not improve.   You have a fever or chills.   You have redness spreading from the affected area.   You have more fluid, blood, or pus coming from the affected area.   Your finger or knuckle is swollen or is hard to move.  This information is not intended to replace advice given to you by your health care provider. Make sure you discuss any questions you have with your health care provider.  Document Released: 10/27/2009 Document Revised: 04/15/2016 Document Reviewed: 10/16/2014  Elsevier Interactive Patient Education  2018 Elsevier Inc.

## 2017-08-24 NOTE — Progress Notes (Signed)
   Subjective:    Patient ID: Whitney Turner, female    DOB: [birth year], 41 y.o.   MRN: [id number]  HPI  Pt is a 41 yo female who presents to the clinic with right medial nail bed and surround tissue red, swollen, tender to touch after a dirty needle from wire brush went into her cuticle on Saturday, 5 days ago. She has tried to keep clean and she has drained some pus out. No fever,chills, SOb.  Marland Kitchen. Active Ambulatory Problems    Diagnosis Date Noted  . Genital herpes 12/01/2012  . 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 Allergic rhinitis 12/01/2012  . Seasonal allergies 12/01/2012  . Plantar fasciitis, bilateral 12/29/2015  . Left anterior knee pain 01/26/2016  . Left hand pain 02/23/2016   Resolved Ambulatory Problems    Diagnosis Date Noted  . No Resolved Ambulatory Problems   Past Medical History:  Diagnosis Date  . Allergy   . Heart murmur   . Herpes        Review of Systems  All other systems reviewed and are negative.      Objective:   Physical Exam  Constitutional: She is oriented to person, place, and time. She appears well-developed and well-nourished.  HENT:  Head: Normocephalic and atraumatic.  Cardiovascular: Normal rate, regular rhythm and normal heart sounds.   Musculoskeletal:  Right ring finger medial nail bed and surrounding tissue red and swollen, warm to palpation. No active drainage. 11 blade needle was used and made incision for drainage. Bright red blood only with manipulation.   Neurological: She is alert and oriented to person, place, and time.  Psychiatric: She has a normal mood and affect. Her behavior is normal.          Assessment & Plan:  Marland KitchenMarland KitchenDiagnoses and all orders for this visit:  Paronychia of right ring finger -     amoxicillin-clavulanate (AUGMENTIN) 875-125 MG tablet; Take 1 tablet by mouth 2 (two) times daily. For 10 days.   HO given. Discussed warm salt water soaks. Keep dry and covered with antibiotic ointment. Finish oral medication.  Follow up if redness or swelling spreading.

## 2017-08-25 ENCOUNTER — Ambulatory Visit: Payer: PRIVATE HEALTH INSURANCE | Admitting: Family Medicine

## 2017-09-03 ENCOUNTER — Other Ambulatory Visit: Payer: Self-pay | Admitting: Family Medicine

## 2017-10-04 ENCOUNTER — Other Ambulatory Visit: Payer: Self-pay | Admitting: *Deleted

## 2017-10-04 MED ORDER — MELOXICAM 15 MG PO TABS: 15.0000 mg | ORAL_TABLET | Freq: Every day | ORAL | 0 refills | Status: DC

## 2018-02-15 ENCOUNTER — Encounter: Payer: Self-pay | Admitting: Family Medicine

## 2018-02-15 ENCOUNTER — Ambulatory Visit (INDEPENDENT_AMBULATORY_CARE_PROVIDER_SITE_OTHER): Payer: PRIVATE HEALTH INSURANCE | Admitting: Family Medicine

## 2018-02-15 DIAGNOSIS — M7712 Lateral epicondylitis, left elbow: Secondary | ICD-10-CM | POA: Insufficient documentation

## 2018-02-15 NOTE — Progress Notes (Signed)
Whitney Turner is a 42 y.o. female who presents to [facility] today for left lateral elbow pain.  Whitney Turner is right-hand dominant.  She notes left lateral elbow pain for the last 3-4 months.  The pain started after she shoveled snow.  She notes the pain is located in the lateral elbow and refers her radiates to the upper lateral arm.  She denies any pain radiating to her hand.  She notes pain is worse with wrist extension.  The pain is limiting her activities such as pushing or pulling or opening jars.  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  She notes that she was hit in the upper arm about a week ago with a softball but does not think her pain today is anything to do with that.  The pain she currently experiences preceded her recent injury.   Past Medical History:  Diagnosis Date  . Allergy   . Heart murmur   . Herpes    Past Surgical History:  Procedure Laterality Date  . HERNIA REPAIR  1982  . KNEE SURGERY  08/2011  . TONSILECTOMY/ADENOIDECTOMY WITH MYRINGOTOMY  2000   Social History   Tobacco Use  . Smoking status: Never Smoker  . Smokeless tobacco: Never Used  Substance Use Topics  . Alcohol use: Not on file     ROS:  As above   Medications: Current Outpatient Medications  Medication Sig Dispense Refill  . acyclovir (ZOVIRAX) 800 MG tablet TAKE 1 TABLET TWICE A DAY FOR 5 DAYS FOR HERPES OUTBREAK 10 tablet 1  . Beclomethasone Dipropionate (QNASL) 80 MCG/ACT AERS Place 2 sprays into both nostrils daily. 1 Inhaler 1  . cetirizine (ZYRTEC) 10 MG tablet Take 10 mg by mouth daily.    . diclofenac sodium (VOLTAREN) 1 % GEL Apply 4 g topically 4 (four) times daily. To affected joint. 100 g 11  . meloxicam (MOBIC) 15 MG tablet Take 1 tablet (15 mg total) daily by mouth. 90 tablet 0   No current facility-administered medications for this visit.    No Known Allergies   Exam:    BP 110/71   Pulse 75   Ht 5\' 1"  (1.549 m)   Wt 119 lb (54 kg)   BMI 22.48 kg/m  General: Well Developed, well nourished, and in no acute distress.  Neuro/Psych: Alert and oriented x3, extra-ocular muscles intact, able to move all 4 extremities, sensation grossly intact. Skin: Warm and dry, no rashes noted.  Respiratory: Not using accessory muscles, speaking in full sentences, trachea midline.  Cardiovascular: Pulses palpable, no extremity edema. Abdomen: Does not appear distended. MSK:  Left arm normal-appearing no ecchymosis or swelling. Shoulder: Normal-appearing nontender normal motion Left elbow: Normal-appearing Normal motion. Tender palpation lateral epicondyle. Strength is intact to extension flexion supination and pronation Pain present in the lateral elbow with resisted wrist and hand extension Wrist normal-appearing nontender normal motion. Pulses capillary refill sensation intact.  Limited musculoskeletal ultrasound of the left elbow reveals the lateral epicondyle with intact common extensor tendon with mild hypoechoic change at the tip with increased vascularity.  No tendon tears or avulsion fractures present. Impression: Mild to moderate lateral epicondylitis     No results found for this or any previous visit (from the past 48 hour(s)). No results found.    Assessment and Plan: 42 y.o. female with left elbow pain is lateral epicondylitis.  She may have some component of triceps  tendinitis as well as her pain pain does refer to the lateral upper arm as well. We discussed options.  Plan for eccentric exercises and stretching.  Additionally will use nitroglycerin patch protocol and will consider a counterforce brace.  If not better next steps would be x-ray and possibly injection versus MRI. Consider formal hand therapy as well.   Discussed warning signs or symptoms. Please see discharge instructions. Patient expresses understanding.

## 2018-02-15 NOTE — Patient Instructions (Signed)
Thank you for coming in today. Do the wrist stretching Use the theraband flexbars and do the wrist exercises with the elbow straight.  You could also use a 1-2 pound hand weight.  Remember to the keep the elbow straight and go down slowly Use the nitroglycerine patches.    Recheck in 4 weeks if not getting better.   Nitroglycerin Protocol   Apply 1/4 nitroglycerin patch to affected area daily.  Change position of patch within the affected area every 24 hours.  You may experience a headache during the first 1-2 weeks of using the patch, these should subside.  If you experience headaches after beginning nitroglycerin patch treatment, you may take your preferred over the counter pain reliever.  Another side effect of the nitroglycerin patch is skin irritation or rash related to patch adhesive.  Please notify our office if you develop more severe headaches or rash, and stop the patch.  Tendon healing with nitroglycerin patch may require 12 to 24 weeks depending on the extent of injury.  Men should not use if taking Viagra, Cialis, or Levitra.   Do not use if you have migraines or rosacea.    Tennis Elbow Rehab Ask your health care provider which exercises are safe for you. Do exercises exactly as told by your health care provider and adjust them as directed. It is normal to feel mild stretching, pulling, tightness, or discomfort as you do these exercises, but you should stop right away if you feel sudden pain or your pain gets worse. Do not begin these exercises until told by your health care provider. Stretching and range of motion exercises These exercises warm up your muscles and joints and improve the movement and flexibility of your elbow. These exercises also help to relieve pain, numbness, and tingling. Exercise A: Wrist extensor stretch 10. Extend your left / right elbow with your fingers pointing down. 11. Gently pull the palm of your left / right hand toward you until you  feel a gentle stretch on the top of your forearm. 12. To increase the stretch, push your left / right hand toward the outer edge or pinkie side of your forearm. 13. Hold this position for __________ seconds. Repeat __________ times. Complete this exercise __________ times a day. If directed by your health care provider, repeat this stretch except do it with a bent elbow this time. Exercise B: Wrist flexor stretch  1. Extend your left / right elbow and turn your palm upward. 2. Gently pull your left / right palm and fingertips back so your wrist extends and your fingers point more toward the ground. 3. You should feel a gentle stretch on the inside of your forearm. 4. Hold this position for __________ seconds. Repeat __________ times. Complete this exercise __________ times a day. If directed by your health care provider, repeat this stretch except do it with a bent elbow this time. Strengthening exercises These exercises build strength and endurance in your elbow. Endurance is the ability to use your muscles for a long time, even after they get tired. Exercise C: Wrist extensors  1. Sit with your left / right forearm palm-down and fully supported on a table or countertop. Your elbow should be resting below the height of your shoulder. 2. Let your left / right wrist extend over the edge of the surface. 3. Loosely hold a __________ weight or a piece of rubber exercise band or tubing in your left / right hand. Slowly curl your left / right hand  up toward your forearm. If you are using band or tubing, hold the band or tubing in place with your other hand to provide resistance. 4. Hold this position for __________ seconds. 5. Slowly return to the starting position. Repeat __________ times. Complete this exercise __________ times a day. Exercise D: Radial deviators  1. Stand with a __________ weight in your left / righthand. Or, sit while holding a rubber exercise band or tubing with your other  arm supported on a table or countertop. Position your hand so your thumb is on top. 2. Raise your hand upward in front of you so your thumb travels toward your forearm, or pull up on the rubber tubing. 3. Hold this position for __________ seconds. 4. Slowly return to the starting position. Repeat __________ times. Complete this exercise __________ times a day. Exercise E: Eccentric wrist extensors 1. Sit with your left / right forearm palm-down and fully supported on a table or countertop. Your elbow should be resting below the height of your shoulder. 2. If told by your health care provider, hold a __________ weight in your hand. 3. Let your left / right wrist extend over the edge of the surface. 4. Use your other hand to lift up your left / right hand toward your forearm. Keep your forearm on the table. 5. Using only the muscles in your left / right hand, slowly lower your hand back down to the starting position. Repeat __________ times. Complete this exercise __________ times a day. This information is not intended to replace advice given to you by your health care provider. Make sure you discuss any questions you have with your health care provider. Document Released: 11/08/2005 Document Revised: 07/14/2016 Document Reviewed: 08/07/2015 Elsevier Interactive Patient Education  [employer].

## 2018-03-23 ENCOUNTER — Ambulatory Visit: Payer: PRIVATE HEALTH INSURANCE | Admitting: Family Medicine

## 2018-03-27 ENCOUNTER — Encounter: Payer: PRIVATE HEALTH INSURANCE | Admitting: Obstetrics & Gynecology

## 2018-03-27 ENCOUNTER — Ambulatory Visit (INDEPENDENT_AMBULATORY_CARE_PROVIDER_SITE_OTHER): Payer: PRIVATE HEALTH INSURANCE | Admitting: Family Medicine

## 2018-03-27 ENCOUNTER — Encounter: Payer: Self-pay | Admitting: Family Medicine

## 2018-03-27 VITALS — BP 109/74 | HR 69 | Ht 61.0 in | Wt 118.0 lb

## 2018-03-27 DIAGNOSIS — M7712 Lateral epicondylitis, left elbow: Secondary | ICD-10-CM | POA: Diagnosis not present

## 2018-03-27 NOTE — Patient Instructions (Signed)
Thank you for coming in today. Retry the exercises.  We can recheck in 6 weeks.  If not better we should consider injection vs MRI.  Let me know what you want to do.   Focus on Wrist extension stretching.   Your level of disability and annoyance is the driving force of how fast we go.    Tennis Elbow Tennis elbow (lateral epicondylitis) is inflammation of the outer tendons of your forearm close to your elbow. Your tendons attach your muscles to your bones. The outer tendons of your forearm are used to extend your wrist, and they attach on the outside part of your elbow. Tennis elbow is often found in people who play tennis, but anyone may get the condition from repeatedly extending the wrist or turning the forearm. What are the causes? This condition is caused by repeatedly extending your wrist and using your hands. It can result from sports or work that requires repetitive forearm movements. Tennis elbow may also be caused by an injury. What increases the risk? You have a higher risk of developing tennis elbow if you play tennis or another racquet sport. You also have a higher risk if you frequently use your hands for work. This condition is also more likely to develop in:  Musicians.  Carpenters, painters, and plumbers.  Cooks.  Cashiers.  People who work in [employer].  Holiday representative workers.  Butchers.  People who use computers.  What are the signs or symptoms? Symptoms of this condition include:  Pain and tenderness in your forearm and the outer part of your elbow. You may only feel the pain when you use your arm, or you may feel it even when you are not using your arm.  A burning feeling that runs from your elbow through your arm.  Weak grip in your hands.  How is this diagnosed? This condition may be diagnosed by medical history and physical exam. You may also have other tests, including:  X-rays.  MRI.  How is this treated? Your health care provider will  recommend lifestyle adjustments, such as resting and icing your arm. Treatment may also include:  Medicines for inflammation. This may include shots of cortisone if your pain continues.  Physical therapy. This may include massage or exercises.  An elbow brace.  Surgery may eventually be recommended if your pain does not go away with treatment. Follow these instructions at home: Activity  Rest your elbow and wrist as directed by your health care provider. Try to avoid any activities that caused the problem until your health care provider says that you can do them again.  If a physical therapist teaches you exercises, do all of them as directed.  If you lift an object, lift it with your palm facing upward. This lowers the stress on your elbow. Lifestyle  If your tennis elbow is caused by sports, check your equipment and make sure that: ? You are using it correctly. ? It is the best fit for you.  If your tennis elbow is caused by work, take breaks frequently, if you are able. Talk with your manager about how to best perform tasks in a way that is safe. ? If your tennis elbow is caused by computer use, talk with your manager about any changes that can be made to your work environment. General instructions  If directed, apply ice to the painful area: ? Put ice in a plastic bag. ? Place a towel between your skin and the bag. ? Leave the  ice on for 20 minutes, 2-3 times per day.  Take medicines only as directed by your health care provider.  If you were given a brace, wear it as directed by your health care provider.  Keep all follow-up visits as directed by your health care provider. This is important. Contact a health care provider if:  Your pain does not get better with treatment.  Your pain gets worse.  You have numbness or weakness in your forearm, hand, or fingers. This information is not intended to replace advice given to you by your health care provider. Make sure you  discuss any questions you have with your health care provider. Document Released: 11/08/2005 Document Revised: 07/08/2016 Document Reviewed: 11/04/2014 Elsevier Interactive Patient Education  [employer].

## 2018-03-27 NOTE — Progress Notes (Signed)
   Rayssa Atha is a 42 y.o. female who presents to [facility] today for follow up left lateral epicondylitis.   Bular is here to follow-up left lateral epicondylitis.  She was seen about 6 weeks ago and had a treatment of home exercise program and nitroglycerin patches.  She notes that she is not really been doing exercises very much because she got too busy and she was unable to tolerate the nitroglycerin patches as a cause a headache.  She notes she is about the same as she was 6 weeks ago she is not worse but not much better.  She notes pain in the lateral left elbow extending to the forearm and lateral upper arm.  She denies any symptoms radiating to the or significant weakness or numbness.   Past Medical History:  Diagnosis Date  . Allergy   . Heart murmur   . Herpes    Past Surgical History:  Procedure Laterality Date  . HERNIA REPAIR  1982  . KNEE SURGERY  08/2011  . TONSILECTOMY/ADENOIDECTOMY WITH MYRINGOTOMY  2000   Social History   Tobacco Use  . Smoking status: Never Smoker  . Smokeless tobacco: Never Used  Substance Use Topics  . Alcohol use: Not on file     ROS:  As above   Medications: Current Outpatient Medications  Medication Sig Dispense Refill  . acyclovir (ZOVIRAX) 800 MG tablet TAKE 1 TABLET TWICE A DAY FOR 5 DAYS FOR HERPES OUTBREAK 10 tablet 1  . Beclomethasone Dipropionate (QNASL) 80 MCG/ACT AERS Place 2 sprays into both nostrils daily. 1 Inhaler 1  . cetirizine (ZYRTEC) 10 MG tablet Take 10 mg by mouth daily.    . diclofenac sodium (VOLTAREN) 1 % GEL Apply 4 g topically 4 (four) times daily. To affected joint. 100 g 11  . meloxicam (MOBIC) 15 MG tablet Take 1 tablet (15 mg total) daily by mouth. 90 tablet 0   No current facility-administered medications for this visit.    No Known Allergies   Exam:  BP 109/74   Pulse 69   Ht  (1.549 m)   Wt 118 lb (53.5 kg)   BMI 22.30 kg/m  General: Well  Developed, well nourished, and in no acute distress.  Neuro/Psych: Alert and oriented x3, extra-ocular muscles intact, able to move all 4 extremities, sensation grossly intact. Skin: Warm and dry, no rashes noted.  Respiratory: Not using accessory muscles, speaking in full sentences, trachea midline.  Cardiovascular: Pulses palpable, no extremity edema. Abdomen: Does not appear distended. MSK:  Left elbow normal-appearing Full range of motion Tender to palpation lateral epicondyle. Pain worsened with resisted hand and finger extension. Pulses capillary refill and sensation are intact distally.     Assessment and Plan: 42 y.o. female with left lateral epicondylitis not better.  We discussed treatment options.  Offered MRI, injection, trial of continued conservative management. She would like to try dedicated home exercise program for 6 more weeks.  If not better at that point next step would be injection versus MRI.  Recheck 6 weeks.   Discussed warning signs or symptoms. Please see discharge instructions. Patient expresses understanding.  I spent 15 minutes with this patient, greater than 50% was face-to-face time counseling regarding treatment plan and differential diagnosis. Marland Kitchen

## 2018-03-29 ENCOUNTER — Telehealth: Payer: Self-pay

## 2018-03-29 NOTE — Telephone Encounter (Signed)
error 

## 2018-04-03 ENCOUNTER — Encounter: Payer: Self-pay | Admitting: Obstetrics & Gynecology

## 2018-04-03 ENCOUNTER — Ambulatory Visit (INDEPENDENT_AMBULATORY_CARE_PROVIDER_SITE_OTHER): Payer: PRIVATE HEALTH INSURANCE | Admitting: Obstetrics & Gynecology

## 2018-04-03 VITALS — BP 102/70 | HR 64 | Ht 61.0 in | Wt 117.0 lb

## 2018-04-03 DIAGNOSIS — Z124 Encounter for screening for malignant neoplasm of cervix: Secondary | ICD-10-CM

## 2018-04-03 DIAGNOSIS — Z01419 Encounter for gynecological examination (general) (routine) without abnormal findings: Secondary | ICD-10-CM

## 2018-04-03 DIAGNOSIS — Z1151 Encounter for screening for human papillomavirus (HPV): Secondary | ICD-10-CM

## 2018-04-03 DIAGNOSIS — N870 Mild cervical dysplasia: Secondary | ICD-10-CM | POA: Diagnosis not present

## 2018-04-03 MED ORDER — METRONIDAZOLE 500 MG PO TABS
500.0000 mg | ORAL_TABLET | Freq: Two times a day (BID) | ORAL | 0 refills | Status: DC
Start: 2018-04-03 — End: 2018-12-04

## 2018-04-03 NOTE — Progress Notes (Signed)
Subjective:    Whitney Turner is a 42 y.o. married P4 (17, 90, 26, and a 21 week loss)  female who presents for an annual exam. She has a vaginal discharge, h/o BV. She has tried OTC cream 3-4 months ago, no help. The patient is sexually active. GYN screening history: last pap: was abnormal: was ASCUS, + HR HPV, colpo done 3/14 ok, CIN1 on biopsy, negative ECC. The patient wears seatbelts: yes. The patient participates in regular exercise: yes. Has the patient ever been transfused or tattooed?: no. The patient reports that there is not domestic violence in her life.   Menstrual History: OB History    Gravida  5   Para  3   Term  3   Preterm      AB  2   Living  3     SAB  2   TAB      Ectopic      Multiple      Live Births              Menarche age: 53 Patient's last menstrual period was 03/26/2018.    The following portions of the patient's history were reviewed and updated as appropriate: allergies, current medications, past family history, past medical history, past social history, past surgical history and problem list.  Review of Systems Pertinent items are noted in HPI.   FH- + breast cancer in maternal and paternal GMs, no colon or gyn cancer Married for 18 years, husband s/p vasectomy Works as a Advertising account executive, Economist, travels Breast fed her children   Objective:    BP 102/70   Pulse 64   Ht  (1.549 m)   Wt 117 lb (53.1 kg)   LMP 03/26/2018   BMI 22.11 kg/m   General Appearance:    Alert, cooperative, no distress, appears stated age  Head:    Normocephalic, without obvious abnormality, atraumatic  Eyes:    PERRL, conjunctiva/corneas clear, EOM's intact, fundi    benign, both eyes  Ears:    Normal TM's and external ear canals, both ears  Nose:   Nares normal, septum midline, mucosa normal, no drainage    or sinus tenderness  Throat:   Lips, mucosa, and tongue normal; teeth and gums normal  Neck:    Supple, symmetrical, trachea midline, no adenopathy;    thyroid:  no enlargement/tenderness/nodules; no carotid   bruit or JVD  Back:     Symmetric, no curvature, ROM normal, no CVA tenderness  Lungs:     Clear to auscultation bilaterally, respirations unlabored  Chest Wall:    No tenderness or deformity   Heart:    Regular rate and rhythm, S1 and S2 normal, no murmur, rub   or gallop  Breast Exam:    No tenderness, masses, or nipple abnormality  Abdomen:     Soft, non-tender, bowel sounds active all four quadrants,    no masses, no organomegaly  Genitalia:    Normal female without lesion or tenderness, discharge c/w BV, normal size and shape, anteverted, mobile, non-tender, normal adnexal exam      Extremities:   Extremities normal, atraumatic, no cyanosis or edema  Pulses:   2+ and symmetric all extremities  Skin:   Skin color, texture, turgor normal, no rashes or lesions  Lymph nodes:   Cervical, supraclavicular, and axillary nodes normal  Neurologic:   CNII-XII intact, normal strength, sensation and reflexes    throughout  .  Assessment:    Healthy female exam.   BV   Plan:     Thin prep Pap smear. with cotesting Treat with flagyl, no alcohol advised

## 2018-04-03 NOTE — Progress Notes (Signed)
PT c/o vaginal discharge

## 2018-04-05 LAB — CYTOLOGY - PAP
Bacterial vaginitis: POSITIVE — AB
Candida vaginitis: NEGATIVE
Diagnosis: NEGATIVE
HPV (WINDOPATH): NOT DETECTED

## 2018-04-12 ENCOUNTER — Telehealth: Payer: Self-pay

## 2018-04-12 DIAGNOSIS — M7712 Lateral epicondylitis, left elbow: Secondary | ICD-10-CM

## 2018-04-12 NOTE — Telephone Encounter (Signed)
Left detailed message on patient vm advising that referral has been placed. Marbin Olshefski,CMA  

## 2018-04-12 NOTE — Telephone Encounter (Signed)
Patient called a requested a referral to be seen for her elbow issues for PT she stated that it has helped her in the past and she wants to do that again. Please advise.Whitney Turner,CMA

## 2018-04-12 NOTE — Telephone Encounter (Signed)
Referral placed.

## 2018-05-08 ENCOUNTER — Ambulatory Visit: Payer: PRIVATE HEALTH INSURANCE | Admitting: Family Medicine

## 2018-09-19 ENCOUNTER — Encounter: Payer: Self-pay | Admitting: Physical Therapy

## 2018-09-19 ENCOUNTER — Ambulatory Visit (INDEPENDENT_AMBULATORY_CARE_PROVIDER_SITE_OTHER): Payer: PRIVATE HEALTH INSURANCE | Admitting: Physical Therapy

## 2018-09-19 DIAGNOSIS — M25522 Pain in left elbow: Secondary | ICD-10-CM

## 2018-09-19 DIAGNOSIS — M6281 Muscle weakness (generalized): Secondary | ICD-10-CM | POA: Diagnosis not present

## 2018-09-19 NOTE — Therapy (Signed)
[facility] [place] [address] [place], [place], [address] Phone: [phone]   Fax:  [phone]  Physical Therapy Evaluation  Patient Details  Name: Whitney Turner MRN: [id number] Date of Birth: 1976/06/01 Referring Provider (PT): Rodolph Bong, MD   Encounter Date: 09/19/2018  PT End of Session - 09/19/18 1022    Visit Number  1    Number of Visits  12    Date for PT Re-Evaluation  10/31/18    PT Start Time  0900   pt arrived late   PT Stop Time  0928    PT Time Calculation (min)  28 min    Activity Tolerance  Patient tolerated treatment well    Behavior During Therapy  [facility] for tasks assessed/performed       Past Medical History:  Diagnosis Date  . Allergy   . Heart murmur   . Herpes     Past Surgical History:  Procedure Laterality Date  . HERNIA REPAIR  1982  . KNEE SURGERY  08/2011  . TONSILECTOMY/ADENOIDECTOMY WITH MYRINGOTOMY  2000    There were no vitals filed for this visit.   Subjective Assessment - 09/19/18 0901    Subjective  Pt is a 42 y/o female who presents to OPPT for Lt lateral epicondylitis.  Pt reports pain began in Dec 2018 following shoveling snow.  Pt with continued pain into forearm and upper arm.  Pt has tried nitroglycerin patches, but had side effects so she stopped using.      Patient Stated Goals  improve pain    Currently in Pain?  Yes    Pain Score  5    up to 7/10; at best 0/10   Pain Location  Elbow    Pain Orientation  Left    Pain Descriptors / Indicators  Aching;Burning   nerve pain   Pain Type  Chronic pain    Pain Onset  More than a month ago    Pain Frequency  Intermittent    Aggravating Factors   holding items, zippers    Pain Relieving Factors  stretching         [facility] PT Assessment - 09/19/18 0905      Assessment   Medical Diagnosis  M77.12 (ICD-10-CM) - Lateral epicondylitis of left elbow    Referring Provider (PT)  Rodolph Bong, MD    Onset Date/Surgical  Date  --   Dec 2018   Hand Dominance  Right    Next MD Visit  PRN    Prior Therapy  unrelated for elbow, at this clinic for knee pain      Precautions   Precautions  None      Restrictions   Weight Bearing Restrictions  No      Balance Screen   Has the patient fallen in the past 6 months  No    Has the patient had a decrease in activity level because of a fear of falling?   No    Is the patient reluctant to leave their home because of a fear of falling?   No      Home Environment   Living Environment  Private residence    Living Arrangements  Spouse/significant other;Children   62, 64, 5 y/o children     Prior Function   Level of Independence  Independent    Vocation  Full time employment    Cytogeneticist for universities: Psychologist, occupational, booking referees and umpires.  works for [employer], works tournaments      Copy Status  Within [employer] for tasks assessed      Observation/Other Assessments   Focus on Therapeutic Outcomes (FOTO)   not completed due to time      Posture/Postural Control   Posture/Postural Control  Postural limitations    Postural Limitations  Rounded Shoulders;Forward head      ROM / Strength   AROM / PROM / Strength  AROM;Strength      AROM   Overall AROM Comments  bil UEs WNL    AROM Assessment Site  --      Strength   Overall Strength Comments  pain with supination and extension on Lt    Strength Assessment Site  Elbow;Forearm;Wrist;Hand    Right/Left Elbow  Right;Left    Right Elbow Flexion  5/5    Right Elbow Extension  5/5    Left Elbow Flexion  5/5    Left Elbow Extension  5/5    Right/Left Forearm  Right;Left    Right Forearm Pronation  5/5    Right Forearm Supination  5/5    Left Forearm Pronation  5/5    Left Forearm Supination  3+/5    Right/Left Wrist  Right;Left    Right Wrist Flexion  5/5    Right Wrist Extension  5/5    Left Wrist Flexion   5/5    Left Wrist Extension  3+/5    Right/Left hand  Right;Left    Right Hand Grip (lbs)  63   64, 65, 60   Left Hand Grip (lbs)  41.33   45, 39, 40               Objective measurements completed on examination: See above findings.      [facility] Adult PT Treatment/Exercise - 09/19/18 1015      Self-Care   Self-Care  Other Self-Care Comments    Other Self-Care Comments   instructed in self massage to wrist extnsors      Exercises   Exercises  Wrist;Hand      Hand Exercises   Other Hand Exercises  towel squeeze       Wrist Exercises   Other wrist exercises  wrist extensors stretch 2 x 30 sec; left    Other wrist exercises  flex bar twist x 10 reps      Modalities   Modalities  Iontophoresis      Iontophoresis   Type of Iontophoresis  Dexamethasone    Location  Lt wrist extensors    Dose  1.0 mL    Time  6 hour patch             PT Education - 09/19/18 1022    Education Details  HEP, DN, ionto    Person(s) Educated  Patient    Methods  Explanation;Handout;Demonstration    Comprehension  Verbalized understanding;Returned demonstration;Need further instruction          PT Long Term Goals - 09/19/18 1028      PT LONG TERM GOAL #1   Title  independent with HEP    Status  New    Target Date  10/31/18      PT LONG TERM GOAL #2   Title  report pain < 4/10 with ADLs for improved function    Status  New    Target Date  10/31/18      PT  LONG TERM GOAL #3   Title  Lt grip strength improved to at least 50# for improved strength and function    Status  New    Target Date  10/31/18      PT LONG TERM GOAL #4   Title  n/a      PT LONG TERM GOAL #5   Title  n/a             Plan - 09/19/18 1022    Clinical Impression Statement  Pt is a 42 y/o female who presents to OPPT for Lt lateral epicondylitis.  Pt demonstrates decreased strength and active trigger points affecting function.  Pt will benefit from PT to address deficits listed.     Clinical Presentation  Stable    Clinical Decision Making  Low    Rehab Potential  Good    PT Frequency  2x / week   1-2x/wk   PT Duration  6 weeks    PT Treatment/Interventions  ADLs/Self Care Home Management;Cryotherapy;Electrical Stimulation;Ultrasound;Moist Heat;Iontophoresis 4mg /ml Dexamethasone;Therapeutic activities;Therapeutic exercise;Patient/family education;Manual techniques;Taping;Dry needling    PT Next Visit Plan  DN to wrist extensors; manual/modalities; review HEP and continue gentle strengthening    PT Home Exercise Plan   Access Code: [email or web address]     Consulted and Agree with Plan of Care  Patient       Patient will benefit from skilled therapeutic intervention in order to improve the following deficits and impairments:  Increased fascial restricitons, Increased muscle spasms, Pain, Decreased strength  Visit Diagnosis: Pain in left elbow - Plan: PT plan of care cert/re-cert  Muscle weakness (generalized) - Plan: PT plan of care cert/re-cert     Problem List Patient Active Problem List   Diagnosis Date Noted  . CIN I (cervical intraepithelial neoplasia I) 04/03/2018  . Lateral epicondylitis of left elbow 02/15/2018  . Left hand pain 02/23/2016  . Left anterior knee pain 01/26/2016  . Plantar fasciitis, bilateral 12/29/2015  . Genital herpes 12/01/2012  . 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 Allergic rhinitis 12/01/2012  . Seasonal allergies 12/01/2012      Clarita Crane, PT, DPT 09/19/18 1:18 PM    [facility] [address] [place], [place], [address] Phone: [phone]   Fax:  [phone]  Name: Elliyah Liszewski MRN: [id number] Date of Birth: Apr 10, 1976

## 2018-09-19 NOTE — Patient Instructions (Addendum)
IONTOPHORESIS PATIENT PRECAUTIONS & CONTRAINDICATIONS:  . Redness under one or both electrodes can occur.  This characterized by a uniform redness that usually disappears within 12 hours of treatment. . Small pinhead size blisters may result in response to the drug.  Contact your physician if the problem persists more than 24 hours. . On rare occasions, iontophoresis therapy can result in temporary skin reactions such as rash, inflammation, irritation or burns.  The skin reactions may be the result of individual sensitivity to the ionic solution used, the condition of the skin at the start of treatment, reaction to the materials in the electrodes, allergies or sensitivity to dexamethasone, or a poor connection between the patch and your skin.  Discontinue using iontophoresis if you have any of these reactions and report to your therapist. . Remove the Patch or electrodes if you have any undue sensation of pain or burning during the treatment and report discomfort to your therapist. . Tell your Therapist if you have had known adverse reactions to the application of electrical current. . If using the Patch, the LED light will turn off when treatment is complete and the patch can be removed.  Approximate treatment time is 1-3 hours.  Remove the patch when light goes off or after 6 hours. . The Patch can be worn during normal activity, however excessive motion where the electrodes have been placed can cause poor contact between the skin and the electrode or uneven electrical current resulting in greater risk of skin irritation. Marland Kitchen Keep out of the reach of children.   . DO NOT use if you have a cardiac pacemaker or any other electrically sensitive implanted device. . DO NOT use if you have a known sensitivity to dexamethasone. . DO NOT use during Magnetic Resonance Imaging (MRI). . DO NOT use over broken or compromised skin (e.g. sunburn, cuts, or acne) due to the increased risk of skin reaction. . DO  NOT SHAVE over the area to be treated:  To establish good contact between the Patch and the skin, excessive hair may be clipped. . DO NOT place the Patch or electrodes on or over your eyes, directly over your heart, or brain. . DO NOT reuse the Patch or electrodes as this may cause burns to occur.   Access Code: [email or web address]  URL: [email or web address]  Date: 09/19/2018  Prepared by: Moshe Cipro   Exercises  Standing Wrist Flexion Stretch - 3 reps - 1 sets - 30 sec hold - 2x daily - 7x weekly  Resistance Bar Twist for Tennis Elbow - 10 reps - 1 sets - 2x daily - 7x weekly  Seated Gripping Towel - 10 reps - 1 sets - 5 sec hold - 2x daily - 7x weekly  Tennis Elbow Self Massage - 1 sets - 30-60 sec hold - 2x daily - 7x weekly  Patient Education  Trigger Point Dry Needling

## 2018-09-21 ENCOUNTER — Ambulatory Visit (INDEPENDENT_AMBULATORY_CARE_PROVIDER_SITE_OTHER): Payer: PRIVATE HEALTH INSURANCE | Admitting: Physical Therapy

## 2018-09-21 ENCOUNTER — Encounter: Payer: Self-pay | Admitting: Physical Therapy

## 2018-09-21 DIAGNOSIS — M6281 Muscle weakness (generalized): Secondary | ICD-10-CM

## 2018-09-21 DIAGNOSIS — M25522 Pain in left elbow: Secondary | ICD-10-CM | POA: Diagnosis not present

## 2018-09-21 NOTE — Therapy (Signed)
[place], [place], [address] Phone: [phone]   Fax:  [phone]  Physical Therapy Treatment  Patient Details  Name: Whitney Turner MRN: [id number] Date of Birth: [birth year] Referring Provider (PT): Gregor Hams, MD   Encounter Date: 09/21/2018  PT End of Session - 09/21/18 0925    Visit Number  2    Number of Visits  12    Date for PT Re-Evaluation  10/31/18    PT Start Time  0845    PT Stop Time  0923    PT Time Calculation (min)  38 min    Activity Tolerance  Patient tolerated treatment well    Behavior During Therapy  [facility] for tasks assessed/performed       Past Medical History:  Diagnosis Date  . Allergy   . Heart murmur   . Herpes     Past Surgical History:  Procedure Laterality Date  . HERNIA REPAIR  1982  . KNEE SURGERY  08/2011  . TONSILECTOMY/ADENOIDECTOMY WITH MYRINGOTOMY  2000    There were no vitals filed for this visit.  Subjective Assessment - 09/21/18 0848    Subjective  doing well; can't feel the stretch very well from her exercises    Patient Stated Goals  improve pain    Currently in Pain?  Yes    Pain Score  2     Pain Location  Elbow    Pain Orientation  Left    Pain Descriptors / Indicators  Sore    Pain Type  Chronic pain    Pain Onset  More than a month ago    Pain Frequency  Intermittent    Aggravating Factors   holding items, zippers    Pain Relieving Factors  stretching                       [facility] Adult PT Treatment/Exercise - 09/21/18 0849      Exercises   Exercises  Wrist;Hand      Wrist Exercises   Other wrist exercises  wrist extensors stretch 2 x 30 sec; left (standing)      Iontophoresis   Type of Iontophoresis  Dexamethasone    Location  Lt wrist extensors    Dose  1.0 mL    Time  8 hour patch      Manual Therapy   Manual Therapy  Soft tissue mobilization    Manual therapy comments  skilled palpation and  monitoring of soft tissue during DN    Soft tissue mobilization  Lt wrist extensors and triceps       Trigger Point Dry Needling - 09/21/18 0925    Consent Given?  Yes    Education Handout Provided  Yes    Muscles Treated Upper Body  --   wrist ext and triceps; twitch responses and incr mm length          PT Education - 09/21/18 0926    Education Details  radial nerve glide    Person(s) Educated  Patient    Methods  Explanation;Demonstration;Handout    Comprehension  Verbalized understanding;Returned demonstration          PT Long Term Goals - 09/19/18 1028      PT LONG TERM GOAL #1   Title  independent with HEP    Status  New    Target Date  10/31/18      PT LONG TERM GOAL #2  Title  report pain < 4/10 with ADLs for improved function    Status  New    Target Date  10/31/18      PT LONG TERM GOAL #3   Title  Lt grip strength improved to at least 50# for improved strength and function    Status  New    Target Date  10/31/18      PT LONG TERM GOAL #4   Title  n/a      PT LONG TERM GOAL #5   Title  n/a            Plan - 09/21/18 0926    Clinical Impression Statement  Pt tolerated session well today with good response to DN.  No goals met as only 2nd visit, but feels ionto was helpful.  Will continue to benefit from PT to maximize function.    Rehab Potential  Good    PT Frequency  2x / week   1-2x/wk   PT Duration  6 weeks    PT Treatment/Interventions  ADLs/Self Care Home Management;Cryotherapy;Electrical Stimulation;Ultrasound;Moist Heat;Iontophoresis 98m/ml Dexamethasone;Therapeutic activities;Therapeutic exercise;Patient/family education;Manual techniques;Taping;Dry needling    PT Next Visit Plan  DN to wrist extensors; manual/modalities; review HEP and continue gentle strengthening    PT Home Exercise Plan   Access Code: [email or web address]    Consulted and Agree with Plan of Care  Patient       Patient will benefit from skilled therapeutic intervention  in order to improve the following deficits and impairments:  Increased fascial restricitons, Increased muscle spasms, Pain, Decreased strength  Visit Diagnosis: Pain in left elbow  Muscle weakness (generalized)     Problem List Patient Active Problem List   Diagnosis Date Noted  . CIN I (cervical intraepithelial neoplasia I) 04/03/2018  . Lateral epicondylitis of left elbow 02/15/2018  . Left hand pain 02/23/2016  . Left anterior knee pain 01/26/2016  . Plantar fasciitis, bilateral 12/29/2015  . Genital herpes 12/01/2012  . 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 Allergic rhinitis 12/01/2012  . Seasonal allergies 12/01/2012      SLaureen Turner PT, DPT 09/21/18 9:28 AM     [facility] [address] [place] [address]Phone: [phone]  Fax:  [phone] Name: Whitney WarsameMRN: [id number] of Birth: [birth year]

## 2018-09-21 NOTE — Patient Instructions (Signed)
Access Code: [email or web address]  URL: [email or web address]  Date: 09/21/2018  Prepared by: Moshe Cipro   Exercises  Standing Wrist Flexion Stretch - 3 reps - 1 sets - 30 sec hold - 2x daily - 7x weekly  Resistance Bar Twist for Tennis Elbow - 10 reps - 1 sets - 2x daily - 7x weekly  Seated Gripping Towel - 10 reps - 1 sets - 5 sec hold - 2x daily - 7x weekly  Tennis Elbow Self Massage - 1 sets - 30-60 sec hold - 2x daily - 7x weekly  Standing Radial Nerve Glide - 3 reps - 1 sets - 30 sec hold - 1x daily - 7x weekly  Patient Education  Trigger Point Dry Needling

## 2018-09-26 ENCOUNTER — Encounter: Payer: Self-pay | Admitting: Physical Therapy

## 2018-09-26 ENCOUNTER — Ambulatory Visit (INDEPENDENT_AMBULATORY_CARE_PROVIDER_SITE_OTHER): Payer: PRIVATE HEALTH INSURANCE | Admitting: Physical Therapy

## 2018-09-26 DIAGNOSIS — M6281 Muscle weakness (generalized): Secondary | ICD-10-CM

## 2018-09-26 DIAGNOSIS — M25522 Pain in left elbow: Secondary | ICD-10-CM | POA: Diagnosis not present

## 2018-09-26 NOTE — Therapy (Signed)
Whitney Turner  Whitney Turner, [place], [address] Phone: [phone]   Fax:  [phone]  Physical Therapy Treatment  Patient Details  Name: Whitney Turner MRN: [id number] Date of Birth: 29-Aug-1976 Referring Provider (PT): Gregor Hams, MD   Encounter Date: 09/26/2018  PT End of Session - 09/26/18 0805    Visit Number  3    Number of Visits  12    Date for PT Re-Evaluation  10/31/18    PT Start Time  0804    PT Stop Time  0845    PT Time Calculation (min)  41 min       Past Medical History:  Diagnosis Date  . Allergy   . Heart murmur   . Herpes     Past Surgical History:  Procedure Laterality Date  . HERNIA REPAIR  1982  . KNEE SURGERY  08/2011  . TONSILECTOMY/ADENOIDECTOMY WITH MYRINGOTOMY  2000    There were no vitals filed for this visit.  Subjective Assessment - 09/26/18 0805    Subjective  Pt reports the DN was painful, but she has had relief in her elbow.  She continues to have some irritating pain above elbow.  She continues to have difficulty opening jars     Currently in Pain?  Yes    Pain Score  5     Pain Location  Elbow    Pain Orientation  Left    Pain Descriptors / Indicators  Aching;Dull    Aggravating Factors   holding items, zippers, opening jars         [facility] PT Assessment - 09/26/18 0001      Assessment   Medical Diagnosis  M77.12 (ICD-10-CM) - Lateral epicondylitis of left elbow    Referring Provider (PT)  Gregor Hams, MD    Onset Date/Surgical Date  --   Dec 2018   Hand Dominance  Right    Next MD Visit  PRN      Strength   Right Hand Grip (lbs)  58    Left Hand Grip (lbs)  45      [facility] Adult PT Treatment/Exercise - 09/26/18 0001      Wrist Exercises   Other wrist exercises  Lt wrist ext stretch with fingers flexed x 30 sec x 3;  Lt tricep stretch with wall assist x 30 sec x 4 reps; cross chest stretch with LUE x 30 sec x 2 reps;  Lt pronation with wrist flexion  stretch x 30 sec x 3 reps     Other wrist exercises  flex bar twist x 10 reps      Modalities   Modalities  Ultrasound      Ultrasound   Ultrasound Location  Lt distal tricep     Ultrasound Parameters  50%, 1.0 w/cm2, 3.3 mHz, 8 min     Ultrasound Goals  Pain      Iontophoresis   Type of Iontophoresis  Dexamethasone    Location  distal lateral tricep    Dose  1.0 mL    Time  12 hr patch (120 mA)      Manual Therapy   Soft tissue mobilization  STM and IASTM to Lt wrist extensors and tricep (pt seated and supine with Lt shoulder in full flexion, arm supported) to decrease fascial tightness and pain.                   PT Long Term Goals - 09/19/18 1028  PT LONG TERM GOAL #1   Title  independent with HEP    Status  New    Target Date  10/31/18      PT LONG TERM GOAL #2   Title  report pain < 4/10 with ADLs for improved function    Status  New    Target Date  10/31/18      PT LONG TERM GOAL #3   Title  Lt grip strength improved to at least 50# for improved strength and function    Status  New    Target Date  10/31/18      PT LONG TERM GOAL #4   Title  n/a      PT LONG TERM GOAL #5   Title  n/a            Plan - 09/26/18 0847    Clinical Impression Statement  Pt continues with decreased grip strength in Lt hand (initially tested 25# of strength, with pain; improved to 45# 2nd attempt).  Tight and tender along entire length of tricep, especially distal lateral head.  Pt had positive response to DN to wrist ext at lateral epicondyle; may benefit from further manual therapy to tricep area.  Pt encouraged to hold off on irritating activities and focus on stretches and ice/manual therapy for a few days. No goals met yet.     Rehab Potential  Good    PT Frequency  2x / week    PT Duration  6 weeks    PT Treatment/Interventions  ADLs/Self Care Home Management;Cryotherapy;Electrical Stimulation;Ultrasound;Moist Heat;Iontophoresis 50m/ml  Dexamethasone;Therapeutic activities;Therapeutic exercise;Patient/family education;Manual techniques;Taping;Dry needling    PT Next Visit Plan  assess response to ionto to elbow.  manual therapy.     Consulted and Agree with Plan of Care  Patient       Patient will benefit from skilled therapeutic intervention in order to improve the following deficits and impairments:  Increased fascial restricitons, Increased muscle spasms, Pain, Decreased strength  Visit Diagnosis: Pain in left elbow  Muscle weakness (generalized)     Problem List Patient Active Problem List   Diagnosis Date Noted  . CIN I (cervical intraepithelial neoplasia I) 04/03/2018  . Lateral epicondylitis of left elbow 02/15/2018  . Left hand pain 02/23/2016  . Left anterior knee pain 01/26/2016  . Plantar fasciitis, bilateral 12/29/2015  . Genital herpes 12/01/2012  . 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 Allergic rhinitis 12/01/2012  . Seasonal allergies 12/01/2012   JKerin Perna PTA 09/26/18 1:27 PM  [place] [address]Phone: [phone]  Fax:  [phone] Name: AKynzi LevayMRN: [id number] of Birth: 1Oct 04, 1977

## 2018-09-29 ENCOUNTER — Encounter: Payer: Self-pay | Admitting: Physical Therapy

## 2018-09-29 ENCOUNTER — Ambulatory Visit: Payer: PRIVATE HEALTH INSURANCE | Admitting: Physical Therapy

## 2018-09-29 DIAGNOSIS — M6281 Muscle weakness (generalized): Secondary | ICD-10-CM | POA: Diagnosis not present

## 2018-09-29 DIAGNOSIS — M25522 Pain in left elbow: Secondary | ICD-10-CM | POA: Diagnosis not present

## 2018-09-29 NOTE — Patient Instructions (Signed)
Access Code: [email or web address]  URL: [email or web address]  Date: 09/29/2018  Prepared by: Moshe Cipro   Exercises  Standing Wrist Flexion Stretch - 3 reps - 1 sets - 30 sec hold - 2x daily - 7x weekly  Resistance Bar Twist for Tennis Elbow - 10 reps - 1 sets - 2x daily - 7x weekly  Seated Gripping Towel - 10 reps - 1 sets - 5 sec hold - 2x daily - 7x weekly  Tennis Elbow Self Massage - 1 sets - 30-60 sec hold - 2x daily - 7x weekly  Standing Radial Nerve Glide - 3 reps - 1 sets - 30 sec hold - 1x daily - 7x weekly  Ulnar Nerve Flossing - 10 reps - 1 sets - 3-5 sec hold - 3x daily - 7x weekly  Patient Education  Trigger Point Dry Needling

## 2018-09-29 NOTE — Therapy (Signed)
[facility] [place] [address] [place], [place], [address] Phone: [phone]   Fax:  [phone]  Physical Therapy Treatment  Patient Details  Name: Whitney Turner MRN: [id number] Date of Birth: [birth year] Referring Provider (PT): Rodolph Bong, MD   Encounter Date: 09/29/2018  PT End of Session - 09/29/18 0926    Visit Number  4    Number of Visits  12    Date for PT Re-Evaluation  10/31/18    PT Start Time  0845    PT Stop Time  0925    PT Time Calculation (min)  40 min    Activity Tolerance  Patient tolerated treatment well    Behavior During Therapy  [facility] for tasks assessed/performed       Past Medical History:  Diagnosis Date  . Allergy   . Heart murmur   . Herpes     Past Surgical History:  Procedure Laterality Date  . HERNIA REPAIR  1982  . KNEE SURGERY  08/2011  . TONSILECTOMY/ADENOIDECTOMY WITH MYRINGOTOMY  2000    There were no vitals filed for this visit.  Subjective Assessment - 09/29/18 0846    Subjective  forearm felt great after DN, but still having pain and tightness in elbow    Patient Stated Goals  improve pain    Pain Score  0-No pain                       [facility] Adult PT Treatment/Exercise - 09/29/18 0847      Exercises   Exercises  Wrist;Hand;Elbow      Elbow Exercises   Other elbow exercises  UBE L3 x 5 min       Wrist Exercises   Other wrist exercises  Lt wrist ext stretch with fingers flexed x 30 sec x 3;  Lt tricep stretch with wall assist x 30 sec x 4 reps; cross chest stretch with LUE x 30 sec x 2 reps;  Lt pronation with wrist flexion stretch x 30 sec x 3 reps       Manual Therapy   Soft tissue mobilization  STM to Lt tricep (pt seated and supine with Lt shoulder in full flexion, arm supported) to decrease fascial tightness and pain.        Trigger Point Dry Needling - 09/29/18 0925    Consent Given?  Yes    Education Handout Provided  Yes    Muscles Treated  Upper Body  --   m. delt and triceps with twitch responses & incr mm length          PT Education - 09/29/18 0922    Education Details  ulnar nerve flossing    Person(s) Educated  Patient    Methods  Explanation;Demonstration;Handout    Comprehension  Verbalized understanding;Returned demonstration          PT Long Term Goals - 09/19/18 1028      PT LONG TERM GOAL #1   Title  independent with HEP    Status  New    Target Date  10/31/18      PT LONG TERM GOAL #2   Title  report pain < 4/10 with ADLs for improved function    Status  New    Target Date  10/31/18      PT LONG TERM GOAL #3   Title  Lt grip strength improved to at least 50# for improved strength and function  Status  New    Target Date  10/31/18      PT LONG TERM GOAL #4   Title  n/a      PT LONG TERM GOAL #5   Title  n/a            Plan - 09/29/18 0926    Clinical Impression Statement  Pt with good response to DN last time, so repeated today to triceps and middle deltoid.  Good twitch responses today with decreased tightness noted following session.  Added ulnar nerve flossing to HEP today.    Rehab Potential  Good    PT Frequency  2x / week    PT Duration  6 weeks    PT Treatment/Interventions  ADLs/Self Care Home Management;Cryotherapy;Electrical Stimulation;Ultrasound;Moist Heat;Iontophoresis 4mg /ml Dexamethasone;Therapeutic activities;Therapeutic exercise;Patient/family education;Manual techniques;Taping;Dry needling    PT Next Visit Plan  assess response to DN to elbow.  manual therapy.  continue per POC    PT Home Exercise Plan   Access Code: [email or web address]     Consulted and Agree with Plan of Care  Patient       Patient will benefit from skilled therapeutic intervention in order to improve the following deficits and impairments:  Increased fascial restricitons, Increased muscle spasms, Pain, Decreased strength  Visit Diagnosis: Pain in left elbow  Muscle weakness  (generalized)     Problem List Patient Active Problem List   Diagnosis Date Noted  . CIN I (cervical intraepithelial neoplasia I) 04/03/2018  . Lateral epicondylitis of left elbow 02/15/2018  . Left hand pain 02/23/2016  . Left anterior knee pain 01/26/2016  . Plantar fasciitis, bilateral 12/29/2015  . Genital herpes 12/01/2012  . 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 Allergic rhinitis 12/01/2012  . Seasonal allergies 12/01/2012      Clarita Crane, PT, DPT 09/29/18 9:28 AM     [facility] [address] [place], [place], [address] Phone: [phone]   Fax:  [phone]  Name: Whitney Turner MRN: [id number] Date of Birth: 05-11-1976

## 2018-10-03 ENCOUNTER — Encounter: Payer: Self-pay | Admitting: Physical Therapy

## 2018-10-03 ENCOUNTER — Ambulatory Visit (INDEPENDENT_AMBULATORY_CARE_PROVIDER_SITE_OTHER): Payer: PRIVATE HEALTH INSURANCE | Admitting: Physical Therapy

## 2018-10-03 DIAGNOSIS — M25522 Pain in left elbow: Secondary | ICD-10-CM

## 2018-10-03 DIAGNOSIS — M6281 Muscle weakness (generalized): Secondary | ICD-10-CM | POA: Diagnosis not present

## 2018-10-03 DIAGNOSIS — R293 Abnormal posture: Secondary | ICD-10-CM

## 2018-10-03 DIAGNOSIS — M25562 Pain in left knee: Secondary | ICD-10-CM

## 2018-10-03 NOTE — Therapy (Signed)
[facility] [place] [address] [place], [place], [address] Phone: [phone]   Fax:  [phone]  Physical Therapy Treatment  Patient Details  Name: Whitney Turner MRN: [id number] Date of Birth: [birth year] Referring Provider (PT): Rodolph Bong, MD   Encounter Date: 10/03/2018  PT End of Session - 10/03/18 1020    Visit Number  5    Number of Visits  12    Date for PT Re-Evaluation  10/31/18    PT Start Time  0805    PT Stop Time  0844    PT Time Calculation (min)  39 min    Activity Tolerance  Patient tolerated treatment well    Behavior During Therapy  [facility] for tasks assessed/performed       Past Medical History:  Diagnosis Date  . Allergy   . Heart murmur   . Herpes     Past Surgical History:  Procedure Laterality Date  . HERNIA REPAIR  1982  . KNEE SURGERY  08/2011  . TONSILECTOMY/ADENOIDECTOMY WITH MYRINGOTOMY  2000    There were no vitals filed for this visit.  Subjective Assessment - 10/03/18 0805    Subjective  arm feels pretty good today; sore after DN    Patient Stated Goals  improve pain    Pain Score  0-No pain                       [facility] Adult PT Treatment/Exercise - 10/03/18 0807      Exercises   Exercises  Wrist;Hand;Elbow      Elbow Exercises   Forearm Supination  Left;10 reps;Bar weights/barbell    Bar Weights/Barbell (Forearm Supination)  2 lbs    Forearm Pronation  Left;10 reps;Bar weights/barbell    Bar Weights/Barbell (Forearm Pronation)  2 lbs    Other elbow exercises  UBE L3 x 4 min (2' each direction)    Other elbow exercises  velcro roller x 5 reps      Hand Exercises   Other Hand Exercises  ball squeeze firm with finger extension      Wrist Exercises   Wrist Flexion  Left;10 reps;Bar weights/barbell    Bar Weights/Barbell (Wrist Flexion)  2 lbs    Wrist Extension  Left;10 reps;Bar weights/barbell    Bar Weights/Barbell (Wrist Extension)  2 lbs    Other  wrist exercises  Lt wrist ext stretch with fingers flexed x 30 sec x 3;  Lt tricep stretch with wall assist x 30 sec x 4 reps; cross chest stretch with LUE x 30 sec x 2 reps;  Lt pronation with wrist flexion stretch x 30 sec x 3 reps       Manual Therapy   Soft tissue mobilization  IASTM Lt tricep in Rt sidelying       Trigger Point Dry Needling - 10/03/18 0843    Consent Given?  Yes    Muscles Treated Upper Body  --   triceps: twitch responses and incr mm length               PT Long Term Goals - 09/19/18 1028      PT LONG TERM GOAL #1   Title  independent with HEP    Status  New    Target Date  10/31/18      PT LONG TERM GOAL #2   Title  report pain < 4/10 with ADLs for improved function    Status  New  Target Date  10/31/18      PT LONG TERM GOAL #3   Title  Lt grip strength improved to at least 50# for improved strength and function    Status  New    Target Date  10/31/18      PT LONG TERM GOAL #4   Title  n/a      PT LONG TERM GOAL #5   Title  n/a            Plan - 10/03/18 1020    Clinical Impression Statement  Pt tolerated session well with good twitch responses to DN today, and tolerated gentle strengthening exercises well.  Progressing well with PT towards goals.    Rehab Potential  Good    PT Frequency  2x / week    PT Duration  6 weeks    PT Treatment/Interventions  ADLs/Self Care Home Management;Cryotherapy;Electrical Stimulation;Ultrasound;Moist Heat;Iontophoresis 4mg /ml Dexamethasone;Therapeutic activities;Therapeutic exercise;Patient/family education;Manual techniques;Taping;Dry needling    PT Next Visit Plan  assess response to DN to elbow.  manual therapy.  continue per POC    PT Home Exercise Plan   Access Code: [email or web address]     Consulted and Agree with Plan of Care  Patient       Patient will benefit from skilled therapeutic intervention in order to improve the following deficits and impairments:  Increased fascial restricitons,  Increased muscle spasms, Pain, Decreased strength  Visit Diagnosis: Pain in left elbow  Muscle weakness (generalized)  Acute pain of left knee  Abnormal posture     Problem List Patient Active Problem List   Diagnosis Date Noted  . CIN I (cervical intraepithelial neoplasia I) 04/03/2018  . Lateral epicondylitis of left elbow 02/15/2018  . Left hand pain 02/23/2016  . Left anterior knee pain 01/26/2016  . Plantar fasciitis, bilateral 12/29/2015  . Genital herpes 12/01/2012  . 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 Allergic rhinitis 12/01/2012  . Seasonal allergies 12/01/2012      Clarita CraneStephanie F Luiz Trumpower, PT, DPT 10/03/18 10:22 AM     [facility] [address] [place], [place], [address] Phone: [phone]   Fax:  [phone]  Name: Whitney Turner MRN: [id number] Date of Birth: 06/09/1976

## 2018-10-05 ENCOUNTER — Ambulatory Visit: Payer: PRIVATE HEALTH INSURANCE | Admitting: Physical Therapy

## 2018-10-05 ENCOUNTER — Encounter: Payer: Self-pay | Admitting: Physical Therapy

## 2018-10-05 DIAGNOSIS — M25522 Pain in left elbow: Secondary | ICD-10-CM | POA: Diagnosis not present

## 2018-10-05 DIAGNOSIS — M6281 Muscle weakness (generalized): Secondary | ICD-10-CM | POA: Diagnosis not present

## 2018-10-05 NOTE — Therapy (Signed)
[facility] [place] [address] [place], [place], [address] Phone: [phone]   Fax:  [phone]  Physical Therapy Treatment  Patient Details  Name: Whitney Turner MRN: [id number] Date of Birth: Feb 05, 1976 Referring Provider (PT): Rodolph Bong, MD   Encounter Date: 10/05/2018  PT End of Session - 10/05/18 0941    Visit Number  6    Number of Visits  12    Date for PT Re-Evaluation  10/31/18    PT Start Time  0935    PT Stop Time  1018    PT Time Calculation (min)  43 min       Past Medical History:  Diagnosis Date  . Allergy   . Heart murmur   . Herpes     Past Surgical History:  Procedure Laterality Date  . HERNIA REPAIR  1982  . KNEE SURGERY  08/2011  . TONSILECTOMY/ADENOIDECTOMY WITH MYRINGOTOMY  2000    There were no vitals filed for this visit.  Subjective Assessment - 10/05/18 0938    Subjective  "I feel like it (arm) is softening up. It's not as tight."  Pt reports she is very tender to touch, over tricep, and has continued soreness.  "I feel like it's improving".     Patient Stated Goals  improve pain    Currently in Pain?  Yes    Pain Score  4     Pain Location  Elbow    Pain Orientation  Left    Pain Descriptors / Indicators  Sore    Aggravating Factors   opening jars, door knobs, picking up objects    Pain Relieving Factors  stretching, ice         [facility] PT Assessment - 10/05/18 0001      Assessment   Medical Diagnosis  M77.12 (ICD-10-CM) - Lateral epicondylitis of left elbow    Referring Provider (PT)  Rodolph Bong, MD    Onset Date/Surgical Date  --   Dec 2018   Hand Dominance  Right    Next MD Visit  PRN      Strength   Right Hand Grip (lbs)  52, 46    Left Hand Grip (lbs)  31, 40         [facility] Adult PT Treatment/Exercise - 10/05/18 0001      Self-Care   Other Self-Care Comments   instructed in self massage with ball to deep back arm line, including Lt rhomboid and  infraspinatus; pt returned demo with cues (reported very tender in these areas)      Exercises   Exercises  Elbow;Wrist      Elbow Exercises   Elbow Extension  Strengthening;Left;10 reps   shoulder flexion at 90; repeated with arm IR and arm flex 90   Theraband Level (Elbow Extension)  Level 1 (Yellow)    Forearm Supination  Left;10 reps;Bar weights/barbell   2 sets   Bar Weights/Barbell (Forearm Supination)  2 lbs    Forearm Pronation  Left;10 reps;Bar weights/barbell   2 sets   Bar Weights/Barbell (Forearm Pronation)  2 lbs    Other elbow exercises  UBE L2 x 4 min (2' each direction)      Hand Exercises   Other Hand Exercises  small and large clamp squeezes with Lt hand x 10 each       Wrist Exercises   Wrist Flexion  Left;10 reps;Bar weights/barbell    Bar Weights/Barbell (Wrist Flexion)  2 lbs  Wrist Extension  Left;10 reps;Bar weights/barbell    Bar Weights/Barbell (Wrist Extension)  2 lbs    Other wrist exercises  Lt wrist ext stretch with fingers flexed x 30 sec x 3;  Lt tricep stretch with wall assist x 30 sec x 4 reps; cross chest stretch with LUE x 30 sec x 2 reps;  Lt pronation with wrist flexion stretch x 30 sec x 3 reps     Other wrist exercises  hand grip exercise with stress ball (issued for HEP)       Ultrasound   Ultrasound Location  Lt distal tricep    Ultrasound Parameters  50% 1.0 w/cm2, 8 min     Ultrasound Goals  Pain      Manual Therapy   Manual Therapy  Taping    Kinesiotex  Create Space      Kinesiotix   Create Space  I strip of reg Rock tape applied to deep back arm line from lateral epicondyle to prox lateral tricep with 20% stretch; perpendicular strip applied to distal tricep with 20% stretch - all to decmpress tissue and decraease pain.         PT Long Term Goals - 09/19/18 1028      PT LONG TERM GOAL #1   Title  independent with HEP    Status  New    Target Date  10/31/18      PT LONG TERM GOAL #2   Title  report pain < 4/10 with  ADLs for improved function    Status  New    Target Date  10/31/18      PT LONG TERM GOAL #3   Title  Lt grip strength improved to at least 50# for improved strength and function    Status  New    Target Date  10/31/18      PT LONG TERM GOAL #4   Title  n/a      PT LONG TERM GOAL #5   Title  n/a            Plan - 10/05/18 1028    Clinical Impression Statement  Pt reporting reduction in tightness along lateral elbow/ tricep of LUE since DN last few sessions.  Grip strength on Lt hand was less than previous visits.  She tolerated all exercises without pain today.  Pt making gradual gains towards goals.     Rehab Potential  Good    PT Frequency  2x / week    PT Duration  6 weeks    PT Treatment/Interventions  ADLs/Self Care Home Management;Cryotherapy;Electrical Stimulation;Ultrasound;Moist Heat;Iontophoresis 4mg /ml Dexamethasone;Therapeutic activities;Therapeutic exercise;Patient/family education;Manual techniques;Taping;Dry needling    PT Next Visit Plan  assess response to taping and US.  continue progressive strengthening/stretching of deep back arm line.        Patient will benefit from skilled therapeutic intervention in order to improve the following deficits and impairments:  Increased fascial restricitons, Increased muscle spasms, Pain, Decreased strength  Visit Diagnosis: Pain in left elbow  Muscle weakness (generalized)     Problem List Patient Active Problem List   Diagnosis Date Noted  . CIN I (cervical intraepithelial neoplasia I) 04/03/2018  . Lateral epicondylitis of left elbow 02/15/2018  . Left hand pain 02/23/2016  . Left anterior knee pain 01/26/2016  . Plantar fasciitis, bilateral 12/29/2015  . Genital herpes 12/01/2012  . 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 Allergic rhinitis 12/01/2012  . Seasonal allergies 12/01/2012   Mayer CamelJennifer Carlson-Long, PTA 10/05/18 10:36 AM  [facility] [place] [address]  [address] [place], [place], [address] Phone: [phone]   Fax:  [phone]  Name: Whitney Turner MRN: [id number] Date of Birth: 04/16/1976

## 2018-10-09 ENCOUNTER — Encounter: Payer: PRIVATE HEALTH INSURANCE | Admitting: Physical Therapy

## 2018-10-10 ENCOUNTER — Encounter: Payer: PRIVATE HEALTH INSURANCE | Admitting: Physical Therapy

## 2018-10-11 ENCOUNTER — Ambulatory Visit (INDEPENDENT_AMBULATORY_CARE_PROVIDER_SITE_OTHER): Payer: PRIVATE HEALTH INSURANCE | Admitting: Physical Therapy

## 2018-10-11 DIAGNOSIS — M25562 Pain in left knee: Secondary | ICD-10-CM | POA: Diagnosis not present

## 2018-10-11 DIAGNOSIS — M25522 Pain in left elbow: Secondary | ICD-10-CM

## 2018-10-11 DIAGNOSIS — R293 Abnormal posture: Secondary | ICD-10-CM

## 2018-10-11 DIAGNOSIS — M6281 Muscle weakness (generalized): Secondary | ICD-10-CM

## 2018-10-11 NOTE — Therapy (Signed)
[facility] [address] [place], [place], [address] Phone: [phone]   Fax:  [phone]  Physical Therapy Treatment  Patient Details  Name: Whitney Turner MRN: [id number] Date of Birth: [birth year] Referring Provider (PT): Rodolph Bongorey, Evan S, MD   Encounter Date: 10/11/2018  PT End of Session - 10/11/18 1036    Visit Number  7    Number of Visits  12    Date for PT Re-Evaluation  10/31/18    PT Start Time  1020    PT Stop Time  1120    PT Time Calculation (min)  60 min    Activity Tolerance  Patient tolerated treatment well    Behavior During Therapy  [facility] for tasks assessed/performed       Past Medical History:  Diagnosis Date  . Allergy   . Heart murmur   . Herpes     Past Surgical History:  Procedure Laterality Date  . HERNIA REPAIR  1982  . KNEE SURGERY  08/2011  . TONSILECTOMY/ADENOIDECTOMY WITH MYRINGOTOMY  2000    There were no vitals filed for this visit.  Subjective Assessment - 10/11/18 1026    Subjective  Pt reports parts of her Lt arm are feeling better.  "The soreness (from DN) has finally gone away".  She is reporting 50% improvement since starting therapy.     Patient Stated Goals  improve pain    Currently in Pain?  Yes    Pain Score  2    up to 4-5/10 with movement   Pain Location  Elbow    Pain Orientation  Left    Pain Descriptors / Indicators  Sore;Tender    Aggravating Factors   picking up items with arm flexed 90 deg, IR, with palm down     Pain Relieving Factors  stretching, ice.          [facility] PT Assessment - 10/11/18 0001      Assessment   Medical Diagnosis  M77.12 (ICD-10-CM) - Lateral epicondylitis of left elbow    Referring Provider (PT)  Rodolph Bongorey, Evan S, MD    Onset Date/Surgical Date  --   Dec 2018   Hand Dominance  Right    Next MD Visit  PRN      Strength   Right Hand Grip (lbs)  55, 70    Left Hand Grip (lbs)  52, 53        [facility] Adult PT Treatment/Exercise -  10/11/18 0001      Exercises   Exercises  Shoulder;Elbow      Elbow Exercises   Forearm Supination  Left;10 reps;Bar weights/barbell   2 sets   Bar Weights/Barbell (Forearm Supination)  2 lbs    Forearm Pronation  Left;10 reps;Bar weights/barbell   2 sets   Bar Weights/Barbell (Forearm Pronation)  2 lbs    Wrist Flexion  Left;10 reps;Bar weights/barbell   2 sets   Bar Weights/Barbell (Wrist Flexion)  2 lbs;3 lbs    Wrist Extension  Strengthening;Left;10 reps   2 sets   Bar Weights/Barbell (Wrist Extension)  2 lbs;3 lbs      Shoulder Exercises: Prone   Flexion  Strengthening;Left;10 reps;Weights    Flexion Weight (lbs)  1    Horizontal ABduction 1  Left;10 reps;Weights    Horizontal ABduction 1 Weight (lbs)  2    Other Prone Exercises  prone row with focus on scapular retraction, 3# x 12 with 3 sec hold  Shoulder Exercises: ROM/Strengthening   UBE (Upper Arm Bike)  L2: 1.5 min each direction, standing      Wrist Exercises   Other wrist exercises  Lt tricep stretch with wall assist x 30 sec x 4 reps; cross chest stretch with LUE x 30 sec x 2 reps;  doorway stretch (3 positions)  x 30 sec x 3 reps each position      Modalities   Modalities  Moist Heat;Electrical Stimulation      Moist Heat Therapy   Number Minutes Moist Heat  15 Minutes    Moist Heat Location  Elbow   Lt tricep     Electrical Stimulation   Electrical Stimulation Location  Lt tricep    Electrical Stimulation Action  IFC    Electrical Stimulation Parameters  to tolerance    Electrical Stimulation Goals  Pain      Manual Therapy   Manual therapy comments  skilled palpation and monitoring of soft tissue during DN    Soft tissue mobilization  STM including TPR and cross fiber friction to Lt rhomboid, lower trap, and mid thoracic paraspinals. Lt tricep STM      [employer]   Create Space  --   Tape removed during session; held so pt can clean arm      Trigger Point Dry Needling - 10/11/18 1531     Consent Given?  Yes    Muscles Treated Upper Body  --   triceps: twitch response with incr mm length               PT Long Term Goals - 09/19/18 1028      PT LONG TERM GOAL #1   Title  independent with HEP    Status  New    Target Date  10/31/18      PT LONG TERM GOAL #2   Title  report pain < 4/10 with ADLs for improved function    Status  New    Target Date  10/31/18      PT LONG TERM GOAL #3   Title  Lt grip strength improved to at least 50# for improved strength and function    Status  New    Target Date  10/31/18      PT LONG TERM GOAL #4   Title  n/a      PT LONG TERM GOAL #5   Title  n/a            Plan - 10/11/18 1105    Clinical Impression Statement  Pt reporting 50% improvement in Lt elbow pain and function.  Pt continues with some banding in Lt tricep; responded well to DN (performed by Moshe Cipro, PT).  She tolerated increased resistance with wrist exercises and new shoulder exercises well.  Progressing towards goals.     Rehab Potential  Good    PT Frequency  2x / week    PT Duration  6 weeks    PT Treatment/Interventions  ADLs/Self Care Home Management;Cryotherapy;Electrical Stimulation;Ultrasound;Moist Heat;Iontophoresis 4mg /ml Dexamethasone;Therapeutic activities;Therapeutic exercise;Patient/family education;Manual techniques;Taping;Dry needling    PT Next Visit Plan  continue progressive strengthening/stretching of deep back arm line.     Consulted and Agree with Plan of Care  Patient       Patient will benefit from skilled therapeutic intervention in order to improve the following deficits and impairments:  Increased fascial restricitons, Increased muscle spasms, Pain, Decreased strength  Visit Diagnosis: Pain in left elbow  Muscle weakness (generalized)  Acute pain  of left knee  Abnormal posture     Problem List Patient Active Problem List   Diagnosis Date Noted  . CIN I (cervical intraepithelial neoplasia I)  04/03/2018  . Lateral epicondylitis of left elbow 02/15/2018  . Left hand pain 02/23/2016  . Left anterior knee pain 01/26/2016  . Plantar fasciitis, bilateral 12/29/2015  . Genital herpes 12/01/2012  . 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 Allergic rhinitis 12/01/2012  . Seasonal allergies 12/01/2012   Mayer Camel, PTA 10/11/18 3:34 PM   Clarita Crane, PT, DPT 10/11/18 3:34 PM     [facility] [address] [place], [place], [address] Phone: [phone]   Fax:  [phone]  Name: Tomasina Keasling MRN: [id number] Date of Birth: 12-Aug-1976

## 2018-10-13 ENCOUNTER — Ambulatory Visit (INDEPENDENT_AMBULATORY_CARE_PROVIDER_SITE_OTHER): Payer: PRIVATE HEALTH INSURANCE | Admitting: Physical Therapy

## 2018-10-13 DIAGNOSIS — M6281 Muscle weakness (generalized): Secondary | ICD-10-CM

## 2018-10-13 DIAGNOSIS — M25522 Pain in left elbow: Secondary | ICD-10-CM

## 2018-10-13 NOTE — Therapy (Signed)
[facility] [place] [address] [place], [place], [address] Phone: [phone]   Fax:  [phone]  Physical Therapy Treatment  Patient Details  Name: Whitney Turner MRN: [id number] Date of Birth: 1976-04-02 Referring Provider (PT): Rodolph Bong, MD   Encounter Date: 10/13/2018  PT End of Session - 10/13/18 1102    Visit Number  8    Number of Visits  12    Date for PT Re-Evaluation  10/31/18    PT Start Time  1020    PT Stop Time  1115    PT Time Calculation (min)  55 min       Past Medical History:  Diagnosis Date  . Allergy   . Heart murmur   . Herpes     Past Surgical History:  Procedure Laterality Date  . HERNIA REPAIR  1982  . KNEE SURGERY  08/2011  . TONSILECTOMY/ADENOIDECTOMY WITH MYRINGOTOMY  2000    There were no vitals filed for this visit.  Subjective Assessment - 10/13/18 1021    Subjective  Pt reports her Lt tricep is feeling much better.  It's not constant any more.  At rest she now has no pain.  She is noticing she is now sore under her Lt armpit/ shoulder blade.  She still has difficulty picking up her filled her heavy cup due to weakness.     Currently in Pain?  Yes    Pain Score  0-No pain   up to 4/10 with certain motions   Pain Location  Shoulder    Pain Orientation  Left    Pain Descriptors / Indicators  Sore         [facility] PT Assessment - 10/13/18 0001      Assessment   Medical Diagnosis  M77.12 (ICD-10-CM) - Lateral epicondylitis of left elbow    Referring Provider (PT)  Rodolph Bong, MD    Onset Date/Surgical Date  --   Dec 2018   Hand Dominance  Right    Next MD Visit  PRN       [facility] Adult PT Treatment/Exercise - 10/13/18 0001      Exercises   Exercises  Shoulder;Elbow      Elbow Exercises   Elbow Extension  --    Theraband Level (Elbow Extension)  --    Forearm Supination  Left;15 reps    Bar Weights/Barbell (Forearm Supination)  3 lbs    Forearm Pronation  15  reps;Left    Bar Weights/Barbell (Forearm Pronation)  3 lbs    Wrist Flexion  Left;15 reps    Bar Weights/Barbell (Wrist Flexion)  3 lbs    Wrist Extension  Left;15 reps    Bar Weights/Barbell (Wrist Extension)  3 lbs      Shoulder Exercises: Supine   Other Supine Exercises  serratus punch with 4# x 15 (bilat)      Shoulder Exercises: Prone   Flexion  Strengthening;Left;10 reps;Weights    Flexion Weight (lbs)  2    Horizontal ABduction 1  Left;10 reps;Weights    Horizontal ABduction 1 Weight (lbs)  2    Other Prone Exercises  prone row with focus on scapular retraction, 3# x 12 with 3 sec hold       Shoulder Exercises: ROM/Strengthening   UBE (Upper Arm Bike)  L2: 1 min forward, 2 min backward       Wrist Exercises   Other wrist exercises  Lt tricep stretch with wall assist x 30  sec x 4 reps; cross chest stretch with LUE x 30 sec x 2 reps;  doorway stretch (3 positions)  x 30 sec x 3 reps each position    Other wrist exercises  Lt wrist flex and ext stretches x 30 sec x 3 reps each       Moist Heat Therapy   Number Minutes Moist Heat  15 Minutes    Moist Heat Location  Elbow;Shoulder   Lt tricep     Electrical Stimulation   Electrical Stimulation Location  Lt tricep/  periscapular muscles     Electrical Stimulation Action  premod to area    Electrical Stimulation Parameters  to tolerance     Electrical Stimulation Goals  Pain      Manual Therapy   Soft tissue mobilization  STM including TPR and cross fiber friction to Lt rhomboid, lower trap, and mid thoracic paraspinals. Lt tricep STM    [employer]  IT consultant  I strip of reg Rock tape applied to deep back arm line from lateral epicondyle to prox lateral tricep with 20% stretch; perpendicular strip applied to distal tricep with 20% stretch - all to decmpress tissue and decraease pain.                   PT Long Term Goals - 09/19/18 1028      PT LONG TERM GOAL #1   Title   independent with HEP    Status  New    Target Date  10/31/18      PT LONG TERM GOAL #2   Title  report pain < 4/10 with ADLs for improved function    Status  New    Target Date  10/31/18      PT LONG TERM GOAL #3   Title  Lt grip strength improved to at least 50# for improved strength and function    Status  New    Target Date  10/31/18      PT LONG TERM GOAL #4   Title  n/a      PT LONG TERM GOAL #5   Title  n/a            Plan - 10/13/18 1106    Clinical Impression Statement  Pt tolerated increased resistance with LUE exercises today, without increase in symptoms. Pt continues with banding and tightness along deep back arm line of fascia; will benefit from continued manual therapy to this area.  Pt reporting greater ease with ADL's; progressing well towards goals of therapy.     Rehab Potential  Good    PT Frequency  2x / week    PT Duration  6 weeks    PT Treatment/Interventions  ADLs/Self Care Home Management;Cryotherapy;Electrical Stimulation;Ultrasound;Moist Heat;Iontophoresis 4mg /ml Dexamethasone;Therapeutic activities;Therapeutic exercise;Patient/family education;Manual techniques;Taping;Dry needling    PT Next Visit Plan  continue progressive strengthening/stretching of deep back arm line.     Consulted and Agree with Plan of Care  Patient       Patient will benefit from skilled therapeutic intervention in order to improve the following deficits and impairments:  Increased fascial restricitons, Increased muscle spasms, Pain, Decreased strength  Visit Diagnosis: Pain in left elbow  Muscle weakness (generalized)     Problem List Patient Active Problem List   Diagnosis Date Noted  . CIN I (cervical intraepithelial neoplasia I) 04/03/2018  . Lateral epicondylitis of left elbow 02/15/2018  . Left hand pain 02/23/2016  .  Left anterior knee pain 01/26/2016  . Plantar fasciitis, bilateral 12/29/2015  . Genital herpes 12/01/2012  . 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  Allergic rhinitis 12/01/2012  . Seasonal allergies 12/01/2012   Mayer CamelJennifer Carlson-Long, PTA 10/13/18 11:23 AM  [facility] [address] [place], [place], [address] Phone: [phone]   Fax:  [phone]  Name: Raynelle Dickmber Mallinger MRN: [id number] Date of Birth: [birth year]

## 2018-10-17 ENCOUNTER — Ambulatory Visit: Payer: PRIVATE HEALTH INSURANCE | Admitting: Physical Therapy

## 2018-10-17 ENCOUNTER — Encounter: Payer: Self-pay | Admitting: Physical Therapy

## 2018-10-17 DIAGNOSIS — M25522 Pain in left elbow: Secondary | ICD-10-CM | POA: Diagnosis not present

## 2018-10-17 DIAGNOSIS — M6281 Muscle weakness (generalized): Secondary | ICD-10-CM | POA: Diagnosis not present

## 2018-10-17 NOTE — Therapy (Signed)
[facility], [place], [address] Phone: [phone]   Fax:  [phone]  Physical Therapy Treatment  Patient Details  Name: Whitney Turner MRN: [id number] Date of Birth: 20-Feb-1976 Referring Provider (PT): Gregor Hams, MD   Encounter Date: 10/17/2018  PT End of Session - 10/17/18 0807    Visit Number  9    Number of Visits  12    Date for PT Re-Evaluation  10/31/18    PT Start Time  0803    PT Stop Time  0859    PT Time Calculation (min)  56 min    Activity Tolerance  Patient tolerated treatment well    Behavior During Therapy  [facility] for tasks assessed/performed       Past Medical History:  Diagnosis Date  . Allergy   . Heart murmur   . Herpes     Past Surgical History:  Procedure Laterality Date  . HERNIA REPAIR  1982  . KNEE SURGERY  08/2011  . TONSILECTOMY/ADENOIDECTOMY WITH MYRINGOTOMY  2000    There were no vitals filed for this visit.  Subjective Assessment - 10/17/18 0807    Subjective  Pt reports her Lt tricep is less sore.  She just feels weak in her LUE.  When she tries to lift things that are too heavy, it still irritates her elbow and flares up her symptoms.      Currently in Pain?  No/denies    Pain Score  0-No pain         [facility] PT Assessment - 10/17/18 0001      Strength   Right Hand Grip (lbs)  65,60    Left Hand Grip (lbs)  53, 55        [facility] Adult PT Treatment/Exercise - 10/17/18 0001      Exercises   Exercises  Shoulder      Elbow Exercises   Forearm Supination  Left;15 reps    Bar Weights/Barbell (Forearm Supination)  3 lbs    Forearm Pronation  15 reps;Left   some discomfort   Bar Weights/Barbell (Forearm Pronation)  3 lbs      Shoulder Exercises: Seated   Flexion  Left;12 reps;Weights    Abduction  Left;10 reps;Weights;Limitations    ABduction Weight (lbs)  3, 2    ABduction Limitations  Painful after 3 reps with 3#; stretched and completed 10  reps      Shoulder Exercises: Prone   Flexion  Strengthening;Left;10 reps;Weights    Flexion Weight (lbs)  2    Horizontal ABduction 1  Left;10 reps;Weights    Horizontal ABduction 1 Weight (lbs)  2    Other Prone Exercises  prone row with focus on scapular retraction, 4# x 12 with 3 sec hold       Shoulder Exercises: Stretch   Other Shoulder Stretches  Lt tricep stretch with wall assist x 30 sec x 4 reps; cross chest stretch with LUE x 30 sec x 2 reps;  doorway stretch (3 positions)  x 30 sec x 3 reps each position      Wrist Exercises   Wrist Flexion  Left;15 reps    Bar Weights/Barbell (Wrist Flexion)  3 lbs    Wrist Extension  Left;15 reps    Bar Weights/Barbell (Wrist Extension)  3 lbs    Other wrist exercises  Lt radial and ulnar deviation with 3# x 10 reps     Other wrist exercises  Lt wrist flex  and ext stretches x 30 sec x 3 reps each       Modalities   Modalities  Moist Heat;Electrical Stimulation;Iontophoresis      Moist Heat Therapy   Number Minutes Moist Heat  15 Minutes    Moist Heat Location  Elbow      Electrical Stimulation   Electrical Stimulation Location  Lt tricep and Lt lateral epicondyle    Electrical Stimulation Action  IFC    Electrical Stimulation Parameters  to tolerance     Electrical Stimulation Goals  Pain      Iontophoresis   Type of Iontophoresis  Dexamethasone    Location  distal lateral tricep    Dose  1.0 mL    Time  6 hr patch       Manual Therapy   Soft tissue mobilization  STM including TPR and cross fiber friction to Lt distal tricep.       [employer]   Create Space  --   pt issued tape and instructions to apply after ionto patch.         PT Long Term Goals - 10/17/18 0809      PT LONG TERM GOAL #1   Title  independent with HEP    Time  6    Period  Weeks    Status  On-going      PT LONG TERM GOAL #2   Title  report pain < 4/10 with ADLs for improved function    Time  6    Period  Weeks    Status  Partially Met    pain 3-4/10 with ADLs.  6/10 with lifting pans for cooking      PT LONG TERM GOAL #3   Title  Lt grip strength improved to at least 50# for improved strength and function    Time  6    Period  Weeks    Status  On-going            Plan - 10/17/18 7322    Clinical Impression Statement  Pt reporting overall decrease in LUE pain with ADLs, but still complains of weakness. She had some pain and burning in Lt elbow with Lt shoulder abdct with 3#; stretched and switched to 2# with improved tolerance. Pt has met LTG#3 and making good gains each visit.      Rehab Potential  Good    PT Frequency  2x / week    PT Duration  6 weeks    PT Treatment/Interventions  ADLs/Self Care Home Management;Cryotherapy;Electrical Stimulation;Ultrasound;Moist Heat;Iontophoresis 69m/ml Dexamethasone;Therapeutic activities;Therapeutic exercise;Patient/family education;Manual techniques;Taping;Dry needling    PT Next Visit Plan  continue progressive strengthening/stretching of deep back arm line.     Consulted and Agree with Plan of Care  Patient       Patient will benefit from skilled therapeutic intervention in order to improve the following deficits and impairments:  Increased fascial restricitons, Increased muscle spasms, Pain, Decreased strength  Visit Diagnosis: Pain in left elbow  Muscle weakness (generalized)     Problem List Patient Active Problem List   Diagnosis Date Noted  . CIN I (cervical intraepithelial neoplasia I) 04/03/2018  . Lateral epicondylitis of left elbow 02/15/2018  . Left hand pain 02/23/2016  . Left anterior knee pain 01/26/2016  . Plantar fasciitis, bilateral 12/29/2015  . Genital herpes 12/01/2012  . 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 Allergic rhinitis 12/01/2012  . Seasonal allergies 12/01/2012    CShelbie Hutching11/26/2019, 10:34 AM  [place]  [facility] [place] [address] [place], [place], [address] Phone:  [phone]   Fax:  [phone]  Name: Whitney Turner MRN: [id number] Date of Birth: 03/23/1976

## 2018-10-23 ENCOUNTER — Encounter: Payer: Self-pay | Admitting: Physical Therapy

## 2018-10-23 ENCOUNTER — Ambulatory Visit (INDEPENDENT_AMBULATORY_CARE_PROVIDER_SITE_OTHER): Payer: PRIVATE HEALTH INSURANCE | Admitting: Physical Therapy

## 2018-10-23 DIAGNOSIS — M6281 Muscle weakness (generalized): Secondary | ICD-10-CM | POA: Diagnosis not present

## 2018-10-23 DIAGNOSIS — M25522 Pain in left elbow: Secondary | ICD-10-CM

## 2018-10-23 NOTE — Therapy (Signed)
[place], [place], [address] Phone: [phone]   Fax:  [phone]  Physical Therapy Treatment  Patient Details  Name: Whitney Turner MRN: [id number] Date of Birth: 07/15/1976 Referring Provider (PT): Gregor Hams, MD   Encounter Date: 10/23/2018  PT End of Session - 10/23/18 0904    Visit Number  10    Number of Visits  12    Date for PT Re-Evaluation  10/31/18    PT Start Time  0802    PT Stop Time  0845    PT Time Calculation (min)  43 min    Activity Tolerance  Patient tolerated treatment well    Behavior During Therapy  [facility] for tasks assessed/performed       Past Medical History:  Diagnosis Date  . Allergy   . Heart murmur   . Herpes     Past Surgical History:  Procedure Laterality Date  . HERNIA REPAIR  1982  . KNEE SURGERY  08/2011  . TONSILECTOMY/ADENOIDECTOMY WITH MYRINGOTOMY  2000    There were no vitals filed for this visit.  Subjective Assessment - 10/23/18 0803    Subjective  arm is sore but improving.  went to gym and did some band exercises.    Pain Score  0-No pain                       [facility] Adult PT Treatment/Exercise - 10/23/18 0807      Exercises   Exercises  Shoulder      Elbow Exercises   Forearm Supination  Left;15 reps    Bar Weights/Barbell (Forearm Supination)  2 lbs    Forearm Pronation  15 reps;Left    Bar Weights/Barbell (Forearm Pronation)  2 lbs    Other elbow exercises  decreased weight due to pain last session      Shoulder Exercises: Standing   Flexion  Left;15 reps;Theraband    Theraband Level (Shoulder Flexion)  Level 1 (Yellow)    ABduction  Left;15 reps;Theraband    Theraband Level (Shoulder ABduction)  Level 1 (Yellow)      Shoulder Exercises: ROM/Strengthening   UBE (Upper Arm Bike)  L2: 2 min forward, 2 min backward       Wrist Exercises   Wrist Flexion  Left;15 reps    Bar Weights/Barbell (Wrist Flexion)  3  lbs    Wrist Extension  Left;15 reps    Bar Weights/Barbell (Wrist Extension)  3 lbs    Other wrist exercises  Lt radial deviation x 15 reps; 3#    Other wrist exercises  Lt wrist flex and ext stretches x 30 sec x 3 reps each       Manual Therapy   Manual therapy comments  skilled palpation and monitoring of soft tissue during DN    Soft tissue mobilization  IASTM to Lt tricep       Trigger Point Dry Needling - 10/23/18 0903    Consent Given?  Yes    Muscles Treated Upper Body  --   tricep: twitch responses and incr mm length               PT Long Term Goals - 10/23/18 0904      PT LONG TERM GOAL #1   Title  independent with HEP    Time  6    Period  Weeks    Status  On-going    Target Date  10/31/18      PT LONG TERM GOAL #2   Title  report pain < 4/10 with ADLs for improved function    Time  6    Period  Weeks    Status  Partially Met   pain 3-4/10 with ADLs.  6/10 with lifting pans for cooking      PT LONG TERM GOAL #3   Title  Lt grip strength improved to at least 50# for improved strength and function    Time  6    Period  Weeks    Status  Achieved            Plan - 10/23/18 0905    Clinical Impression Statement  Pt progressing well and able to tolerate increase strengthening exercises well today.  Progressing well with PT and hopeful for transition to home program in next 1-2 weeks.    Rehab Potential  Good    PT Frequency  2x / week    PT Duration  6 weeks    PT Treatment/Interventions  ADLs/Self Care Home Management;Cryotherapy;Electrical Stimulation;Ultrasound;Moist Heat;Iontophoresis 64m/ml Dexamethasone;Therapeutic activities;Therapeutic exercise;Patient/family education;Manual techniques;Taping;Dry needling    PT Next Visit Plan  continue progressive strengthening/stretching of deep back arm line.     PT Home Exercise Plan   Access Code: [email or web address]    Consulted and Agree with Plan of Care  Patient       Patient will benefit from skilled  therapeutic intervention in order to improve the following deficits and impairments:  Increased fascial restricitons, Increased muscle spasms, Pain, Decreased strength  Visit Diagnosis: Pain in left elbow  Muscle weakness (generalized)     Problem List Patient Active Problem List   Diagnosis Date Noted  . CIN I (cervical intraepithelial neoplasia I) 04/03/2018  . Lateral epicondylitis of left elbow 02/15/2018  . Left hand pain 02/23/2016  . Left anterior knee pain 01/26/2016  . Plantar fasciitis, bilateral 12/29/2015  . Genital herpes 12/01/2012  . 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 Allergic rhinitis 12/01/2012  . Seasonal allergies 12/01/2012      SLaureen Abrahams PT, DPT 10/23/18 9:10 AM    [facility] [place] [address]Phone: [phone]  Fax:  [phone] Name: AKataleah BejarMRN: [id number] of Birth: [birth year]

## 2018-10-25 ENCOUNTER — Ambulatory Visit (INDEPENDENT_AMBULATORY_CARE_PROVIDER_SITE_OTHER): Payer: PRIVATE HEALTH INSURANCE | Admitting: Physical Therapy

## 2018-10-25 ENCOUNTER — Encounter: Payer: Self-pay | Admitting: Physical Therapy

## 2018-10-25 DIAGNOSIS — M25522 Pain in left elbow: Secondary | ICD-10-CM | POA: Diagnosis not present

## 2018-10-25 DIAGNOSIS — M6281 Muscle weakness (generalized): Secondary | ICD-10-CM

## 2018-10-25 NOTE — Therapy (Signed)
[place] [place] [facility] [place], [place], [address] Phone: [phone]   Fax:  [phone]  Physical Therapy Treatment  Patient Details  Name: Whitney Turner MRN: [id number] Date of Birth: Oct 16, 1976 Referring Provider (PT): Gregor Hams, MD   Encounter Date: 10/25/2018  PT End of Session - 10/25/18 0805    Visit Number  11    Number of Visits  12    Date for PT Re-Evaluation  10/31/18    PT Start Time  0804    PT Stop Time  6644    PT Time Calculation (min)  55 min       Past Medical History:  Diagnosis Date  . Allergy   . Heart murmur   . Herpes     Past Surgical History:  Procedure Laterality Date  . HERNIA REPAIR  1982  . KNEE SURGERY  08/2011  . TONSILECTOMY/ADENOIDECTOMY WITH MYRINGOTOMY  2000    There were no vitals filed for this visit.  Subjective Assessment - 10/25/18 0806    Subjective  The morning after last session, her elbow felt better.  She still notices pain/ irritation in elbow at night.  She is trying to avoid certain activities with her Lt hand so not to agrivate it.      Patient Stated Goals  improve pain    Currently in Pain?  No/denies         [facility] PT Assessment - 10/25/18 0001      Assessment   Medical Diagnosis  M77.12 (ICD-10-CM) - Lateral epicondylitis of left elbow    Referring Provider (PT)  Gregor Hams, MD    Onset Date/Surgical Date  --   Dec 2018   Hand Dominance  Right    Next MD Visit  PRN      Strength   Left Forearm Supination  4+/5    Left Wrist Extension  4-/5   with pain   Left Hand Grip (lbs)  50        [facility] Adult PT Treatment/Exercise - 10/25/18 0001      Exercises   Exercises  Wrist      Elbow Exercises   Elbow Extension  Strengthening;Left;10 reps   shoulder flexion at 90; red/ green band-1 set each   Other elbow exercises  elbow ext with arm in neutral, green band x 10      Shoulder Exercises: Standing   Flexion  Left;Theraband;10 reps     Theraband Level (Shoulder Flexion)  Level 1 (Yellow);Level 2 (Red)    ABduction  Left;Theraband;10 reps    Theraband Level (Shoulder ABduction)  Level 1 (Yellow);Level 2 (Red)      Shoulder Exercises: ROM/Strengthening   UBE (Upper Arm Bike)  L2: 1.5 min each direction, standing      Shoulder Exercises: Stretch   Other Shoulder Stretches  Lt tricep stretch with wall assist x 30 sec x 4 reps;   doorway stretch (3 positions)  x 30 sec x 3 reps each position      Wrist Exercises   Wrist Extension  Left;10 reps   seated; 2 sets   Bar Weights/Barbell (Wrist Extension)  3 lbs    Other wrist exercises  Velcro board with wrist flexion/ext (Lt) x 4 reps.     Other wrist exercises  Lt wrist flex and ext stretches x 30 sec x 3 reps each       Moist Heat Therapy   Number Minutes Moist Heat  15  Minutes    Moist Heat Location  Elbow      Electrical Stimulation   Electrical Stimulation Location  Lt tricep and Lt lateral epicondyle    Electrical Stimulation Action  IFC    Electrical Stimulation Parameters  to tolerance    Electrical Stimulation Goals  Pain      Manual Therapy   Soft tissue mobilization  IASTM to Lt tricep, brachioradialis, prox wrist extensors to decrease fascial restrictions and pain.                   PT Long Term Goals - 10/23/18 0904      PT LONG TERM GOAL #1   Title  independent with HEP    Time  6    Period  Weeks    Status  On-going    Target Date  10/31/18      PT LONG TERM GOAL #2   Title  report pain < 4/10 with ADLs for improved function    Time  6    Period  Weeks    Status  Partially Met   pain 3-4/10 with ADLs.  6/10 with lifting pans for cooking      PT LONG TERM GOAL #3   Title  Lt grip strength improved to at least 50# for improved strength and function    Time  6    Period  Weeks    Status  Achieved            Plan - 10/25/18 1232    Clinical Impression Statement  Pt demonstrated improved Lt forearm strenght, but wrist  ext strength remains limited and painful with MMT.  She requires freq cues to slow down speed of exercise during session.  She had difficulty tolerating wrist ext strengthening, often using compensatory strategies.  Pt has made good progress towards goals, but will benefit from continued PT intervention to maximize functional mobility.     Rehab Potential  Good    PT Frequency  2x / week    PT Duration  6 weeks    PT Treatment/Interventions  ADLs/Self Care Home Management;Cryotherapy;Electrical Stimulation;Ultrasound;Moist Heat;Iontophoresis 48m/ml Dexamethasone;Therapeutic activities;Therapeutic exercise;Patient/family education;Manual techniques;Taping;Dry needling    PT Next Visit Plan  continue progressive strengthening/stretching of deep back arm line and Lt wrist extensors.  DN to brachioradialis.      PT Home Exercise Plan   Access Code: [email or web address]    Consulted and Agree with Plan of Care  Patient       Patient will benefit from skilled therapeutic intervention in order to improve the following deficits and impairments:  Increased fascial restricitons, Increased muscle spasms, Pain, Decreased strength  Visit Diagnosis: Pain in left elbow  Muscle weakness (generalized)     Problem List Patient Active Problem List   Diagnosis Date Noted  . CIN I (cervical intraepithelial neoplasia I) 04/03/2018  . Lateral epicondylitis of left elbow 02/15/2018  . Left hand pain 02/23/2016  . Left anterior knee pain 01/26/2016  . Plantar fasciitis, bilateral 12/29/2015  . Genital herpes 12/01/2012  . 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 Allergic rhinitis 12/01/2012  . Seasonal allergies 12/01/2012   JKerin Perna PTA 10/25/18 12:35 PM  [place] [address]Phone: [phone]  Fax:  [phone] Name: Whitney QuinterosMRN: [id number] of Birth: [birth year]

## 2018-10-30 ENCOUNTER — Encounter: Payer: Self-pay | Admitting: Physical Therapy

## 2018-10-30 ENCOUNTER — Ambulatory Visit: Payer: PRIVATE HEALTH INSURANCE | Admitting: Physical Therapy

## 2018-10-30 DIAGNOSIS — M6281 Muscle weakness (generalized): Secondary | ICD-10-CM

## 2018-10-30 DIAGNOSIS — M25522 Pain in left elbow: Secondary | ICD-10-CM

## 2018-10-30 NOTE — Therapy (Addendum)
[place], [place], [address] Phone: [phone]   Fax:  [phone]  Physical Therapy Treatment/Discharge   Patient Details  Name: Whitney Turner MRN: [id number] Date of Birth: 03-22-1976 Referring Provider (PT): Gregor Hams, MD   Encounter Date: 10/30/2018  PT End of Session - 10/30/18 0926    Visit Number  12    Number of Visits  18    Date for PT Re-Evaluation  12/11/18    PT Start Time  0845    PT Stop Time  0925    PT Time Calculation (min)  40 min    Activity Tolerance  Patient tolerated treatment well    Behavior During Therapy  [facility] for tasks assessed/performed       Past Medical History:  Diagnosis Date  . Allergy   . Heart murmur   . Herpes     Past Surgical History:  Procedure Laterality Date  . HERNIA REPAIR  1982  . KNEE SURGERY  08/2011  . TONSILECTOMY/ADENOIDECTOMY WITH MYRINGOTOMY  2000    There were no vitals filed for this visit.   Subjective Assessment - 10/30/18 0847    Subjective  symptoms improving, did weight exercises Friday which went well.  a little soreness after but overall much better.    Patient Stated Goals  improve pain    Pain Score  4     Pain Location  Elbow    Pain Orientation  Left    Pain Descriptors / Indicators  Sore    Pain Type  Chronic pain    Pain Onset  More than a month ago    Pain Frequency  Intermittent    Aggravating Factors   picking up items with arm flexed, IR with palm down    Pain Relieving Factors  stretching, ice                    Objective measurements completed on examination: See above findings.      [facility] Adult PT Treatment/Exercise - 10/30/18 0849      Self-Care   Other Self-Care Comments   verbally reviewed HEP and added band exercises to HEP      Shoulder Exercises: ROM/Strengthening   UBE (Upper Arm Bike)  L2: 1.5 min each direction, standing      Manual Therapy   Soft tissue mobilization   IASTM to Lt tricep, brachioradialis, prox wrist extensors to decrease fascial restrictions and pain.        Trigger Point Dry Needling - 10/30/18 5366    Consent Given?  Yes    Muscles Treated Upper Body  --   wrist extensors and brachioradialis; twitch responses          PT Education - 10/30/18 0926    Education Details  added band exercises to HEP (pt declined previous visit)    Person(s) Educated  Patient    Methods  Explanation;Handout    Comprehension  Verbalized understanding          PT Long Term Goals - 10/30/18 0926      PT LONG TERM GOAL #1   Title  independent with HEP    Time  6    Period  Weeks    Status  Achieved      PT LONG TERM GOAL #2   Title  report pain < 4/10 with ADLs for improved function    Time  6    Period  Weeks  Status  Achieved   pain 3-4/10 with ADLs.  6/10 with lifting pans for cooking      PT LONG TERM GOAL #3   Title  Lt grip strength improved to at least 50# for improved strength and function    Time  6    Period  Weeks    Status  Achieved      PT LONG TERM GOAL #4   Title  new goals to be determined if pt returns             Plan - 10/30/18 9147    Clinical Impression Statement  Pt has met all goals and reports signficant improvement in symptoms overall.  Requesting hold from PT at this time, and will reassess PRN if pt returns.    Rehab Potential  Good    PT Frequency  1x / week   PRN up to 1x/wk x 6 wks   PT Duration  6 weeks    PT Treatment/Interventions  ADLs/Self Care Home Management;Cryotherapy;Electrical Stimulation;Ultrasound;Moist Heat;Iontophoresis 34m/ml Dexamethasone;Therapeutic activities;Therapeutic exercise;Patient/family education;Manual techniques;Taping;Dry needling    PT Next Visit Plan  continue progressive strengthening/stretching of deep back arm line and Lt wrist extensors.  DN to brachioradialis.      PT Home Exercise Plan   Access Code: [email or web address]    Consulted and Agree with Plan of Care   Patient       Patient will benefit from skilled therapeutic intervention in order to improve the following deficits and impairments:  Increased fascial restricitons, Increased muscle spasms, Pain, Decreased strength  Visit Diagnosis: Pain in left elbow - Plan: PT plan of care cert/re-cert  Muscle weakness (generalized) - Plan: PT plan of care cert/re-cert     Problem List Patient Active Problem List   Diagnosis Date Noted  . CIN I (cervical intraepithelial neoplasia I) 04/03/2018  . Lateral epicondylitis of left elbow 02/15/2018  . Left hand pain 02/23/2016  . Left anterior knee pain 01/26/2016  . Plantar fasciitis, bilateral 12/29/2015  . Genital herpes 12/01/2012  . 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 Allergic rhinitis 12/01/2012  . Seasonal allergies 12/01/2012       SLaureen Abrahams PT, DPT 10/30/18 9:30 AM    [facility] [address] [address]Phone: [phone]  Fax:  [phone] Name: Whitney FleschMRN: [id number] of Birth: 1February 18, 1977    PHYSICAL THERAPY DISCHARGE SUMMARY  Visits from Start of Care: 12  Current functional level related to goals / functional outcomes: See above   Remaining deficits: See above   Education / Equipment: HEP  Plan: Patient agrees to discharge.  Patient goals were met. Patient is being discharged due to meeting the stated rehab goals.  ?????     SLaureen Abrahams PT, DPT 11/30/18 1:49 PM  [facility] [place] [address] [phone](office) [phone](fax)

## 2018-10-30 NOTE — Patient Instructions (Signed)
Access Code: [email or web address]  URL: [email or web address]  Date: 10/30/2018  Prepared by: Moshe CiproStephanie Isatu Macinnes   Exercises  Standing Wrist Flexion Stretch - 3 reps - 1 sets - 30 sec hold - 2x daily - 7x weekly  Resistance Bar Twist for Tennis Elbow - 10 reps - 1 sets - 2x daily - 7x weekly  Seated Gripping Towel - 10 reps - 1 sets - 5 sec hold - 2x daily - 7x weekly  Tennis Elbow Self Massage - 1 sets - 30-60 sec hold - 2x daily - 7x weekly  Standing Radial Nerve Glide - 3 reps - 1 sets - 30 sec hold - 1x daily - 7x weekly  Ulnar Nerve Flossing - 10 reps - 1 sets - 3-5 sec hold - 3x daily - 7x weekly  Elbow Extension with Anchored Resistance - 10 reps - 1 sets - 1x daily - 7x weekly  Standing Elbow Extension with Self-Anchored Resistance - 10 reps - 1 sets - 1x daily - 7x weekly  Standing Shoulder Flexion with Resistance - 10 reps - 1 sets - 1x daily - 7x weekly  Standing Single Arm Shoulder Abduction with Resistance - 10 reps - 1 sets - 1x daily - 7x weekly  Patient Education  Trigger Point Dry Needling

## 2018-11-02 ENCOUNTER — Encounter: Payer: PRIVATE HEALTH INSURANCE | Admitting: Physical Therapy

## 2018-12-04 ENCOUNTER — Ambulatory Visit (INDEPENDENT_AMBULATORY_CARE_PROVIDER_SITE_OTHER): Payer: PRIVATE HEALTH INSURANCE | Admitting: Physician Assistant

## 2018-12-04 ENCOUNTER — Encounter: Payer: Self-pay | Admitting: Physician Assistant

## 2018-12-04 VITALS — BP 117/69 | HR 73 | Ht 61.0 in | Wt 120.0 lb

## 2018-12-04 DIAGNOSIS — K649 Unspecified hemorrhoids: Secondary | ICD-10-CM | POA: Diagnosis not present

## 2018-12-04 DIAGNOSIS — Z131 Encounter for screening for diabetes mellitus: Secondary | ICD-10-CM

## 2018-12-04 DIAGNOSIS — Z0000 Encounter for general adult medical examination without abnormal findings: Secondary | ICD-10-CM | POA: Diagnosis not present

## 2018-12-04 DIAGNOSIS — Z13220 Encounter for screening for lipoid disorders: Secondary | ICD-10-CM | POA: Diagnosis not present

## 2018-12-04 MED ORDER — PRAMOXINE HCL 1 % RE FOAM: 1.0000 "application " | Freq: Three times a day (TID) | RECTAL | 1 refills | Status: DC | PRN

## 2018-12-04 NOTE — Patient Instructions (Addendum)
Health Maintenance for Postmenopausal Women Menopause is a normal process in which your reproductive ability comes to an end. This process happens gradually over a span of months to years, usually between the ages of 62 and 89. Menopause is complete when you have missed 12 consecutive menstrual periods. It is important to talk with your health care provider about some of the most common conditions that affect postmenopausal women, such as heart disease, cancer, and bone loss (osteoporosis). Adopting a healthy lifestyle and getting preventive care can help to promote your health and wellness. Those actions can also lower your chances of developing some of these common conditions. What should I know about menopause? During menopause, you may experience a number of symptoms, such as:  Moderate-to-severe hot flashes.  Night sweats.  Decrease in sex drive.  Mood swings.  Headaches.  Tiredness.  Irritability.  Memory problems.  Insomnia. Choosing to treat or not to treat menopausal changes is an individual decision that you make with your health care provider. What should I know about hormone replacement therapy and supplements? Hormone therapy products are effective for treating symptoms that are associated with menopause, such as hot flashes and night sweats. Hormone replacement carries certain risks, especially as you become older. If you are thinking about using estrogen or estrogen with progestin treatments, discuss the benefits and risks with your health care provider. What should I know about heart disease and stroke? Heart disease, heart attack, and stroke become more likely as you age. This may be due, in part, to the hormonal changes that your body experiences during menopause. These can affect how your body processes dietary fats, triglycerides, and cholesterol. Heart attack and stroke are both medical emergencies. There are many things that you can do to help prevent heart disease  and stroke:  Have your blood pressure checked at least every 1-2 years. High blood pressure causes heart disease and increases the risk of stroke.  If you are 79-72 years old, ask your health care provider if you should take aspirin to prevent a heart attack or a stroke.  Do not use any tobacco products, including cigarettes, chewing tobacco, or electronic cigarettes. If you need help quitting, ask your health care provider.  It is important to eat a healthy diet and maintain a healthy weight. ? Be sure to include plenty of vegetables, fruits, low-fat dairy products, and lean protein. ? Avoid eating foods that are high in solid fats, added sugars, or salt (sodium).  Get regular exercise. This is one of the most important things that you can do for your health. ? Try to exercise for at least 150 minutes each week. The type of exercise that you do should increase your heart rate and make you sweat. This is known as moderate-intensity exercise. ? Try to do strengthening exercises at least twice each week. Do these in addition to the moderate-intensity exercise.  Know your numbers.Ask your health care provider to check your cholesterol and your blood glucose. Continue to have your blood tested as directed by your health care provider.  What should I know about cancer screening? There are several types of cancer. Take the following steps to reduce your risk and to catch any cancer development as early as possible. Breast Cancer  Practice breast self-awareness. ? This means understanding how your breasts normally appear and feel. ? It also means doing regular breast self-exams. Let your health care provider know about any changes, no matter how small.  If you are 40 or  older, have a clinician do a breast exam (clinical breast exam or CBE) every year. Depending on your age, family history, and medical history, it may be recommended that you also have a yearly breast X-ray (mammogram).  If you  have a family history of breast cancer, talk with your health care provider about genetic screening.  If you are at high risk for breast cancer, talk with your health care provider about having an MRI and a mammogram every year.  Breast cancer (BRCA) gene test is recommended for women who have family members with BRCA-related cancers. Results of the assessment will determine the need for genetic counseling and BRCA1 and for BRCA2 testing. BRCA-related cancers include these types: ? Breast. This occurs in males or females. ? Ovarian. ? Tubal. This may also be called fallopian tube cancer. ? Cancer of the abdominal or pelvic lining (peritoneal cancer). ? Prostate. ? Pancreatic. Cervical, Uterine, and Ovarian Cancer Your health care provider may recommend that you be screened regularly for cancer of the pelvic organs. These include your ovaries, uterus, and vagina. This screening involves a pelvic exam, which includes checking for microscopic changes to the surface of your cervix (Pap test).  For women ages 21-65, health care providers may recommend a pelvic exam and a Pap test every three years. For women ages 66-65, they may recommend the Pap test and pelvic exam, combined with testing for human papilloma virus (HPV), every five years. Some types of HPV increase your risk of cervical cancer. Testing for HPV may also be done on women of any age who have unclear Pap test results.  Other health care providers may not recommend any screening for nonpregnant women who are considered low risk for pelvic cancer and have no symptoms. Ask your health care provider if a screening pelvic exam is right for you.  If you have had past treatment for cervical cancer or a condition that could lead to cancer, you need Pap tests and screening for cancer for at least 20 years after your treatment. If Pap tests have been discontinued for you, your risk factors (such as having a new sexual partner) need to be reassessed  to determine if you should start having screenings again. Some women have medical problems that increase the chance of getting cervical cancer. In these cases, your health care provider may recommend that you have screening and Pap tests more often.  If you have a family history of uterine cancer or ovarian cancer, talk with your health care provider about genetic screening.  Hemorrhoids Hemorrhoids are swollen veins that may develop: In the butt (rectum). These are called internal hemorrhoids. Around the opening of the butt (anus). These are called external hemorrhoids. Hemorrhoids can cause pain, itching, or bleeding. Most of the time, they do not cause serious problems. They usually get better with diet changes, lifestyle changes, and other home treatments. What are the causes? This condition may be caused by: Having trouble pooping (constipation). Pushing hard (straining) to poop. Watery poop (diarrhea). Pregnancy. Being very overweight (obese). Sitting for long periods of time. Heavy lifting or other activity that causes you to strain. Anal sex. Riding a bike for a long period of time. What are the signs or symptoms? Symptoms of this condition include: Pain. Itching or soreness in the butt. Bleeding from the butt. Leaking poop. Swelling in the area. One or more lumps around the opening of your butt. How is this diagnosed? A doctor can often diagnose this condition by looking at  the affected area. The doctor may also: Do an exam that involves feeling the area with a gloved hand (digital rectal exam). Examine the area inside your butt using a small tube (anoscope). Order blood tests. This may be done if you have lost a lot of blood. Have you get a test that involves looking inside the colon using a flexible tube with a camera on the end (sigmoidoscopy or colonoscopy). How is this treated? This condition can usually be treated at home. Your doctor may tell you to change what you  eat, make lifestyle changes, or try home treatments. If these do not help, procedures can be done to remove the hemorrhoids or make them smaller. These may involve: Placing rubber bands at the base of the hemorrhoids to cut off their blood supply. Injecting medicine into the hemorrhoids to shrink them. Shining a type of light energy onto the hemorrhoids to cause them to fall off. Doing surgery to remove the hemorrhoids or cut off their blood supply. Follow these instructions at home: Eating and drinking  Eat foods that have a lot of fiber in them. These include whole grains, beans, nuts, fruits, and vegetables. Ask your doctor about taking products that have added fiber (fibersupplements). Reduce the amount of fat in your diet. You can do this by: Eating low-fat dairy products. Eating less red meat. Avoiding processed foods. Drink enough fluid to keep your pee (urine) pale yellow. Managing pain and swelling  Take a warm-water bath (sitz bath) for 20 minutes to ease pain. Do this 3-4 times a day. You may do this in a bathtub or using a portable sitz bath that fits over the toilet. If told, put ice on the painful area. It may be helpful to use ice between your warm baths. Put ice in a plastic bag. Place a towel between your skin and the bag. Leave the ice on for 20 minutes, 2-3 times a day. General instructions Take over-the-counter and prescription medicines only as told by your doctor. Medicated creams and medicines may be used as told. Exercise often. Ask your doctor how much and what kind of exercise is best for you. Go to the bathroom when you have the urge to poop. Do not wait. Avoid pushing too hard when you poop. Keep your butt dry and clean. Use wet toilet paper or moist towelettes after pooping. Do not sit on the toilet for a long time. Keep all follow-up visits as told by your doctor. This is important. Contact a doctor if you: Have pain and swelling that do not get better  with treatment or medicine. Have trouble pooping. Cannot poop. Have pain or swelling outside the area of the hemorrhoids. Get help right away if you have: Bleeding that will not stop. Summary Hemorrhoids are swollen veins in the butt or around the opening of the butt. They can cause pain, itching, or bleeding. Eat foods that have a lot of fiber in them. These include whole grains, beans, nuts, fruits, and vegetables. Take a warm-water bath (sitz bath) for 20 minutes to ease pain. Do this 3-4 times a day. This information is not intended to replace advice given to you by your health care provider. Make sure you discuss any questions you have with your health care provider. Document Released: 08/17/2008 Document Revised: 03/30/2018 Document Reviewed: 03/30/2018 Elsevier Interactive Patient Education  [employer].  If you have vaginal bleeding after reaching menopause, tell your health care provider.  There are currently no reliable tests available  to screen for ovarian cancer. Lung Cancer Lung cancer screening is recommended for adults 83-53 years old who are at high risk for lung cancer because of a history of smoking. A yearly low-dose CT scan of the lungs is recommended if you:  Currently smoke.  Have a history of at least 30 pack-years of smoking and you currently smoke or have quit within the past 15 years. A pack-year is smoking an average of one pack of cigarettes per day for one year. Yearly screening should:  Continue until it has been 15 years since you quit.  Stop if you develop a health problem that would prevent you from having lung cancer treatment. Colorectal Cancer  This type of cancer can be detected and can often be prevented.  Routine colorectal cancer screening usually begins at age 83 and continues through age 52.  If you have risk factors for colon cancer, your health care provider may recommend that you be screened at an earlier age.  If you have a  family history of colorectal cancer, talk with your health care provider about genetic screening.  Your health care provider may also recommend using home test kits to check for hidden blood in your stool.  A small camera at the end of a tube can be used to examine your colon directly (sigmoidoscopy or colonoscopy). This is done to check for the earliest forms of colorectal cancer.  Direct examination of the colon should be repeated every 5-10 years until age 79. However, if early forms of precancerous polyps or small growths are found or if you have a family history or genetic risk for colorectal cancer, you may need to be screened more often. Skin Cancer  Check your skin from head to toe regularly.  Monitor any moles. Be sure to tell your health care provider: ? About any new moles or changes in moles, especially if there is a change in a mole's shape or color. ? If you have a mole that is larger than the size of a pencil eraser.  If any of your family members has a history of skin cancer, especially at a young age, talk with your health care provider about genetic screening.  Always use sunscreen. Apply sunscreen liberally and repeatedly throughout the day.  Whenever you are outside, protect yourself by wearing long sleeves, pants, a wide-brimmed hat, and sunglasses. What should I know about osteoporosis? Osteoporosis is a condition in which bone destruction happens more quickly than new bone creation. After menopause, you may be at an increased risk for osteoporosis. To help prevent osteoporosis or the bone fractures that can happen because of osteoporosis, the following is recommended:  If you are 82-25 years old, get at least 1,000 mg of calcium and at least 600 mg of vitamin D per day.  If you are older than age 86 but younger than age 83, get at least 1,200 mg of calcium and at least 600 mg of vitamin D per day.  If you are older than age 55, get at least 1,200 mg of calcium and at  least 800 mg of vitamin D per day. Smoking and excessive alcohol intake increase the risk of osteoporosis. Eat foods that are rich in calcium and vitamin D, and do weight-bearing exercises several times each week as directed by your health care provider. What should I know about how menopause affects my mental health? Depression may occur at any age, but it is more common as you become older. Common symptoms of  depression include:  Low or sad mood.  Changes in sleep patterns.  Changes in appetite or eating patterns.  Feeling an overall lack of motivation or enjoyment of activities that you previously enjoyed.  Frequent crying spells. Talk with your health care provider if you think that you are experiencing depression. What should I know about immunizations? It is important that you get and maintain your immunizations. These include:  Tetanus, diphtheria, and pertussis (Tdap) booster vaccine.  Influenza every year before the flu season begins.  Pneumonia vaccine.  Shingles vaccine. Your health care provider may also recommend other immunizations. This information is not intended to replace advice given to you by your health care provider. Make sure you discuss any questions you have with your health care provider. Document Released: 12/31/2005 Document Revised: 05/28/2016 Document Reviewed: 08/12/2015 Elsevier Interactive Patient Education  2019 [employer].

## 2018-12-04 NOTE — Progress Notes (Signed)
Subjective:     Whitney Turner is a 43 y.o. female and is here for a comprehensive physical exam. The patient reports problems - she is having some painful hemrrhoids for the last 10 plus years. they intermittently bother her more or less depending on how much exercise of lifting she is doing . She feels like thery are effecting her quality of life and wants referral.   Social History   Socioeconomic History  . Marital status: Married    Spouse name: Not on file  . Number of children: Not on file  . Years of education: Not on file  . Highest education level: Not on file  Occupational History  . Not on file  Social Needs  . Financial resource strain: Not on file  . Food insecurity:    Worry: Not on file    Inability: Not on file  . Transportation needs:    Medical: Not on file    Non-medical: Not on file  Tobacco Use  . Smoking status: Never Smoker  . Smokeless tobacco: Never Used  Substance and Sexual Activity  . Alcohol use: Yes    Comment: Socially  . Drug use: Never  . Sexual activity: Yes    Birth control/protection: None  Lifestyle  . Physical activity:    Days per week: Not on file    Minutes per session: Not on file  . Stress: Not on file  Relationships  . Social connections:    Talks on phone: Not on file    Gets together: Not on file    Attends religious service: Not on file    Active member of club or organization: Not on file    Attends meetings of clubs or organizations: Not on file    Relationship status: Not on file  . Intimate partner violence:    Fear of current or ex partner: Not on file    Emotionally abused: Not on file    Physically abused: Not on file    Forced sexual activity: Not on file  Other Topics Concern  . Not on file  Social History Narrative  . Not on file   Health Maintenance  Topic Date Due  . INFLUENZA VACCINE  06/23/2019 (Originally 06/22/2018)  . PAP SMEAR-Modifier  04/03/2021  . TETANUS/TDAP  12/01/2022  . HIV Screening   Completed    The following portions of the patient's history were reviewed and updated as appropriate: allergies, current medications, past family history, past medical history, past social history, past surgical history and problem list.  Review of Systems Pertinent items noted in HPI and remainder of comprehensive ROS otherwise negative.   Objective:    BP 117/69   Pulse 73   Ht 5\' 1"  (1.549 m)   Wt 120 lb (54.4 kg)   BMI 22.67 kg/m  General appearance: alert, cooperative and appears stated age Head: Normocephalic, without obvious abnormality, atraumatic Eyes: conjunctivae/corneas clear. PERRL, EOM's intact. Fundi benign. Ears: normal TM's and external ear canals both ears Nose: Nares normal. Septum midline. Mucosa normal. No drainage or sinus tenderness. Throat: lips, mucosa, and tongue normal; teeth and gums normal Neck: no adenopathy, no carotid bruit, no JVD, supple, symmetrical, trachea midline and thyroid not enlarged, symmetric, no tenderness/mass/nodules Back: symmetric, no curvature. ROM normal. No CVA tenderness. Lungs: clear to auscultation bilaterally Heart: regular rate and rhythm, S1, S2 normal, no murmur, click, rub or gallop Abdomen: soft, non-tender; bowel sounds normal; no masses,  no organomegaly Extremities: extremities normal, atraumatic, no  cyanosis or edema Pulses: 2+ and symmetric Skin: Skin color, texture, turgor normal. No rashes or lesions Lymph nodes: Cervical, supraclavicular, and axillary nodes normal. Neurologic: Alert and oriented X 3, normal strength and tone. Normal symmetric reflexes. Normal coordination and gait    Assessment:    Healthy female exam.      Plan:    Marland KitchenMarland KitchenAmber was seen today for annual exam.  Diagnoses and all orders for this visit:  Routine physical examination -     Lipid Panel w/reflex Direct LDL -     COMPLETE METABOLIC PANEL WITH GFR  Seasonal allergies  Screening for diabetes mellitus -     COMPLETE METABOLIC  PANEL WITH GFR  Screening for lipid disorders -     Lipid Panel w/reflex Direct LDL  Hemorrhoids, unspecified hemorrhoid type -     pramoxine (PROCTOFOAM) 1 % foam; Place 1 application rectally 3 (three) times daily as needed for anal itching or hemorrhoids.   .. Depression screen [facility] 2/9 12/04/2018 07/14/2017  Decreased Interest 0 0  Down, Depressed, Hopeless 0 0  PHQ - 2 Score 0 0   .Marland Kitchen Discussed 150 minutes of exercise a week.  Encouraged vitamin D 1000 units and Calcium 1300mg  or 4 servings of dairy a day.  Fasting labs ordered.  Pt declined mammogram.  Up to date pap smear.   Discussed hemorrhoids and prevention with stool softener. Proctofoam given for prn usage. HO given. Pt has had for 10+ years. Referral placed for Gi.  See After Visit Summary for Counseling Recommendations

## 2018-12-05 LAB — COMPLETE METABOLIC PANEL WITH GFR
AG Ratio: 2.3 (calc) (ref 1.0–2.5)
ALBUMIN MSPROF: 4.4 g/dL (ref 3.6–5.1)
ALT: 13 U/L (ref 6–29)
AST: 13 U/L (ref 10–30)
Alkaline phosphatase (APISO): 25 U/L — ABNORMAL LOW (ref 33–115)
BUN: 15 mg/dL (ref 7–25)
CALCIUM: 9 mg/dL (ref 8.6–10.2)
CO2: 26 mmol/L (ref 20–32)
CREATININE: 0.74 mg/dL (ref 0.50–1.10)
Chloride: 102 mmol/L (ref 98–110)
GFR, EST AFRICAN AMERICAN: 117 mL/min/{1.73_m2} (ref >=60)
GFR, EST NON AFRICAN AMERICAN: 101 mL/min/{1.73_m2} (ref >=60)
Globulin: 1.9 g/dL (calc) (ref 1.9–3.7)
Glucose, Bld: 84 mg/dL (ref 65–99)
Potassium: 4.1 mmol/L (ref 3.5–5.3)
SODIUM: 137 mmol/L (ref 135–146)
TOTAL PROTEIN: 6.3 g/dL (ref 6.1–8.1)
Total Bilirubin: 0.4 mg/dL (ref 0.2–1.2)

## 2018-12-05 LAB — LIPID PANEL W/REFLEX DIRECT LDL
CHOLESTEROL: 227 mg/dL — AB (ref <200)
HDL: 64 mg/dL (ref >50)
LDL Cholesterol (Calc): 150 mg/dL (calc) — ABNORMAL HIGH
Non-HDL Cholesterol (Calc): 163 mg/dL (calc) — ABNORMAL HIGH (ref <130)
Total CHOL/HDL Ratio: 3.5 (calc) (ref <5.0)
Triglycerides: 44 mg/dL (ref <150)

## 2018-12-06 NOTE — Progress Notes (Signed)
Call pt: LDL is up from a few years ago from 137 to 150. HDL is great which is reassuring. Continue to watch sat fats and processed foods. Overall CV risk is still low because your risk factors are low. Recheck in one year. If increases above 160 I would recommend a statin medication.

## 2019-05-18 ENCOUNTER — Other Ambulatory Visit: Payer: Self-pay | Admitting: Neurology

## 2019-05-18 MED ORDER — ACYCLOVIR 800 MG PO TABS: ORAL_TABLET | ORAL | 1 refills | Status: DC

## 2019-05-18 NOTE — Telephone Encounter (Signed)
Patient left vm asking for refill of acyclovir. She hasn't taken in a long while but is in need. Wants sent to [facility]. Please advise.

## 2019-06-25 ENCOUNTER — Ambulatory Visit (INDEPENDENT_AMBULATORY_CARE_PROVIDER_SITE_OTHER): Payer: PRIVATE HEALTH INSURANCE

## 2019-06-25 ENCOUNTER — Ambulatory Visit (INDEPENDENT_AMBULATORY_CARE_PROVIDER_SITE_OTHER): Payer: PRIVATE HEALTH INSURANCE | Admitting: Sports Medicine

## 2019-06-25 ENCOUNTER — Other Ambulatory Visit: Payer: Self-pay

## 2019-06-25 ENCOUNTER — Encounter: Payer: Self-pay | Admitting: Sports Medicine

## 2019-06-25 DIAGNOSIS — M25461 Effusion, right knee: Secondary | ICD-10-CM

## 2019-06-25 DIAGNOSIS — M25561 Pain in right knee: Secondary | ICD-10-CM | POA: Diagnosis not present

## 2019-06-25 MED ORDER — MELOXICAM 15 MG PO TABS: ORAL_TABLET | ORAL | 3 refills | Status: DC

## 2019-06-25 NOTE — Assessment & Plan Note (Signed)
Internal derangement, I am also having difficulty feeling her ACL. She has tenderness at the anteromedial joint line. X-rays today, arthrocentesis, MRI. Meloxicam. Reaction knee brace

## 2019-06-25 NOTE — Progress Notes (Signed)
Subjective:    CC: Right knee pain  HPI: This is a very pleasant 43 year old female with a history of a left ACL reconstruction, she coaches and umpires women's softball, recently she is had pain and swelling in her right knee without injury, mostly at the medial joint line, swelling is moderate to the point where she cannot fully flex or extend the knee.  Moderate, persistent, localized without radiation.  I reviewed the past medical history, family history, social history, surgical history, and allergies today and no changes were needed.  Please see the problem list section below in epic for further details.  Past Medical History: Past Medical History:  Diagnosis Date  . Allergy   . Heart murmur   . Herpes    Past Surgical History: Past Surgical History:  Procedure Laterality Date  . HERNIA REPAIR  1982  . KNEE SURGERY  08/2011  . TONSILECTOMY/ADENOIDECTOMY WITH MYRINGOTOMY  2000   Social History: Social History   Socioeconomic History  . Marital status: Married    Spouse name: Not on file  . Number of children: Not on file  . Years of education: Not on file  . Highest education level: Not on file  Occupational History  . Not on file  Social Needs  . Financial resource strain: Not on file  . Food insecurity    Worry: Not on file    Inability: Not on file  . Transportation needs    Medical: Not on file    Non-medical: Not on file  Tobacco Use  . Smoking status: Never Smoker  . Smokeless tobacco: Never Used  Substance and Sexual Activity  . Alcohol use: Yes    Comment: Socially  . Drug use: Never  . Sexual activity: Yes    Birth control/protection: None  Lifestyle  . Physical activity    Days per week: Not on file    Minutes per session: Not on file  . Stress: Not on file  Relationships  . Social Musicianconnections    Talks on phone: Not on file    Gets together: Not on file    Attends religious service: Not on file    Active member of club or organization: Not  on file    Attends meetings of clubs or organizations: Not on file    Relationship status: Not on file  Other Topics Concern  . Not on file  Social History Narrative  . Not on file   Family History: Family History  Problem Relation Age of Onset  . Hypertension Father   . Hyperlipidemia Father   . Cancer Maternal Grandmother        breast cancer  . Cancer Maternal Grandfather        lung cancer  . Alzheimer's disease Paternal Grandmother   . Cancer Paternal Grandfather        lung cancer   Allergies: No Known Allergies Medications: See med rec.  Review of Systems: No fevers, chills, night sweats, weight loss, chest pain, or shortness of breath.   Objective:    General: Well Developed, well nourished, and in no acute distress.  Neuro: Alert and oriented x3, extra-ocular muscles intact, sensation grossly intact.  HEENT: Normocephalic, atraumatic, pupils equal round reactive to light, neck supple, no masses, no lymphadenopathy, thyroid nonpalpable.  Skin: Warm and dry, no rashes. Cardiac: Regular rate and rhythm, no murmurs rubs or gallops, no lower extremity edema.  Respiratory: Clear to auscultation bilaterally. Not using accessory muscles, speaking in full sentences. Right  knee: Swollen, tender at the medial joint line ROM normal in flexion and extension and lower leg rotation. Ligaments with solid consistent endpoints including PCL, LCL, MCL. I am unable to fully appreciate the endpoint of the ACL with a positive Lachman sign. Negative Mcmurray's and provocative meniscal tests. Non painful patellar compression. Patellar and quadriceps tendons unremarkable. Hamstring and quadriceps strength is normal.  Procedure: Real-time Ultrasound Guided aspiration of right knee Device: GE Logiq E  Verbal informed consent obtained.  Time-out conducted.  Noted no overlying erythema, induration, or other signs of local infection.  Skin prepped in a sterile fashion.  Local  anesthesia: Topical Ethyl chloride.  With sterile technique and under real time ultrasound guidance:  Using 18-gauge needle aspirated 20 cc of clear, straw-colored fluid. Completed without difficulty  Pain immediately resolved suggesting accurate placement of the medication.  Advised to call if fevers/chills, erythema, induration, drainage, or persistent bleeding.  Images permanently stored and available for review in the ultrasound unit.  Impression: Technically successful ultrasound guided injection.  Impression and Recommendations:    Effusion of right knee Internal derangement, I am also having difficulty feeling her ACL. She has tenderness at the anteromedial joint line. X-rays today, arthrocentesis, MRI. Meloxicam. Reaction knee brace    ___________________________________________ Gwen Her. Dianah Field, M.D., ABFM., CAQSM. Primary Care and Sports Medicine [facility]  Adjunct Professor of [employer]

## 2019-06-26 ENCOUNTER — Telehealth: Payer: Self-pay | Admitting: Sports Medicine

## 2019-06-26 NOTE — Telephone Encounter (Signed)
Prior authorization obtained for MRI right knee without contrast 73721. Authorization number is [id number] good from 06/26/2019 to 12/23/2019. KG LPN

## 2019-07-02 ENCOUNTER — Ambulatory Visit (INDEPENDENT_AMBULATORY_CARE_PROVIDER_SITE_OTHER): Payer: PRIVATE HEALTH INSURANCE | Admitting: Obstetrics & Gynecology

## 2019-07-02 ENCOUNTER — Encounter: Payer: Self-pay | Admitting: Obstetrics & Gynecology

## 2019-07-02 ENCOUNTER — Other Ambulatory Visit: Payer: Self-pay

## 2019-07-02 ENCOUNTER — Ambulatory Visit: Payer: PRIVATE HEALTH INSURANCE

## 2019-07-02 VITALS — BP 100/62 | HR 61 | Ht 61.0 in | Wt 122.0 lb

## 2019-07-02 DIAGNOSIS — Z01419 Encounter for gynecological examination (general) (routine) without abnormal findings: Secondary | ICD-10-CM | POA: Diagnosis not present

## 2019-07-02 DIAGNOSIS — Z1151 Encounter for screening for human papillomavirus (HPV): Secondary | ICD-10-CM | POA: Diagnosis not present

## 2019-07-02 DIAGNOSIS — Z124 Encounter for screening for malignant neoplasm of cervix: Secondary | ICD-10-CM

## 2019-07-02 MED ORDER — NORGESTREL-ETHINYL ESTRADIOL 0.3-30 MG-MCG PO TABS: 1.0000 | ORAL_TABLET | Freq: Every day | ORAL | 11 refills | Status: DC

## 2019-07-02 NOTE — Progress Notes (Signed)
Last pap- 04/03/18- negative

## 2019-07-02 NOTE — Progress Notes (Addendum)
Subjective:    Whitney Turner is a 43 y.o. married P3 (18, 48, and 72 yo kids)  female who presents for an annual exam. The patient has no complaints today. The patient is sexually active. GYN screening history: last pap: was normal. The patient wears seatbelts: yes. The patient participates in regular exercise: yes. (running but has knee problems) Has the patient ever been transfused or tattooed?: no. The patient reports that there is not domestic violence in her life.   Menstrual History: OB History    Gravida  5   Para  3   Term  3   Preterm      AB  2   Living  3     SAB  2   TAB      Ectopic      Multiple      Live Births              Menarche age: 63 Patient's last menstrual period was 06/06/2019.    The following portions of the patient's history were reviewed and updated as appropriate: allergies, current medications, past family history, past medical history, past social history, past surgical history and problem list.  Review of Systems Pertinent items are noted in HPI.   Coaches and sports official Married for 19 years, husband had a vasectomy Periods monthly and last 4-5 days, but painful FH- + breast cancer in both GMs, no colon or gyn cancers   Objective:    BP 100/62   Pulse 61   Ht 5\' 1"  (1.549 m)   Wt 122 lb (55.3 kg)   LMP 06/06/2019   BMI 23.05 kg/m   General Appearance:    Alert, cooperative, no distress, appears stated age  Head:    Normocephalic, without obvious abnormality, atraumatic  Eyes:    PERRL, conjunctiva/corneas clear, EOM's intact, fundi    benign, both eyes  Ears:    Normal TM's and external ear canals, both ears  Nose:   Nares normal, septum midline, mucosa normal, no drainage    or sinus tenderness  Throat:   Lips, mucosa, and tongue normal; teeth and gums normal  Neck:   Supple, symmetrical, trachea midline, no adenopathy;    thyroid:  no enlargement/tenderness/nodules; no carotid   bruit or JVD  Back:     Symmetric,  no curvature, ROM normal, no CVA tenderness  Lungs:     Clear to auscultation bilaterally, respirations unlabored  Chest Wall:    No tenderness or deformity   Heart:    Regular rate and rhythm, S1 and S2 normal, no murmur, rub   or gallop  Breast Exam:    No tenderness, masses, or nipple abnormality  Abdomen:     Soft, non-tender, bowel sounds active all four quadrants,    no masses, no organomegaly  Genitalia:    Normal female without lesion, discharge or tenderness, normal size and shape, retroverted, mobile, non-tender, normal adnexal exam      Extremities:   Extremities normal, atraumatic, no cyanosis or edema  Pulses:   2+ and symmetric all extremities  Skin:   Skin color, texture, turgor normal, no rashes or lesions  Lymph nodes:   Cervical, supraclavicular, and axillary nodes normal  Neurologic:   CNII-XII intact, normal strength, sensation and reflexes    throughout  .    Assessment:    Healthy female exam.   painful periods Plan:     Thin prep Pap smear. with cotesting She prefers delays start  for mammograms Start lo ovral with NMP for dysmenorrhea She will check her BP at home and notify me if it is elevated.

## 2019-07-04 LAB — CYTOLOGY - PAP
Diagnosis: NEGATIVE
HPV: NOT DETECTED

## 2019-07-09 ENCOUNTER — Other Ambulatory Visit: Payer: Self-pay

## 2019-07-09 ENCOUNTER — Ambulatory Visit (INDEPENDENT_AMBULATORY_CARE_PROVIDER_SITE_OTHER): Payer: PRIVATE HEALTH INSURANCE

## 2019-07-09 DIAGNOSIS — M25561 Pain in right knee: Secondary | ICD-10-CM | POA: Diagnosis not present

## 2019-07-09 DIAGNOSIS — M25461 Effusion, right knee: Secondary | ICD-10-CM | POA: Diagnosis not present

## 2019-07-11 ENCOUNTER — Ambulatory Visit (INDEPENDENT_AMBULATORY_CARE_PROVIDER_SITE_OTHER): Payer: PRIVATE HEALTH INSURANCE | Admitting: Sports Medicine

## 2019-07-11 ENCOUNTER — Other Ambulatory Visit: Payer: Self-pay

## 2019-07-11 DIAGNOSIS — M25461 Effusion, right knee: Secondary | ICD-10-CM

## 2019-07-11 NOTE — Progress Notes (Signed)
Subjective:    CC: Right knee pain  HPI: This is a pleasant 43 year old female, I saw her for knee swelling at the last visit, we obtained an MRI after performing an arthrocentesis.  The MRI results will be dictated below, pain is moderate, persistent, localized to the medial joint line and under the kneecap without radiation, no mechanical symptoms.  I reviewed the past medical history, family history, social history, surgical history, and allergies today and no changes were needed.  Please see the problem list section below in epic for further details.  Past Medical History: Past Medical History:  Diagnosis Date  . Allergy   . Heart murmur   . Herpes    Past Surgical History: Past Surgical History:  Procedure Laterality Date  . HERNIA REPAIR  1982  . KNEE SURGERY  08/2011  . TONSILECTOMY/ADENOIDECTOMY WITH MYRINGOTOMY  2000   Social History: Social History   Socioeconomic History  . Marital status: Married    Spouse name: Not on file  . Number of children: Not on file  . Years of education: Not on file  . Highest education level: Not on file  Occupational History  . Not on file  Social Needs  . Financial resource strain: Not on file  . Food insecurity    Worry: Not on file    Inability: Not on file  . Transportation needs    Medical: Not on file    Non-medical: Not on file  Tobacco Use  . Smoking status: Never Smoker  . Smokeless tobacco: Never Used  Substance and Sexual Activity  . Alcohol use: Yes    Comment: Socially  . Drug use: Never  . Sexual activity: Yes    Birth control/protection: Condom  Lifestyle  . Physical activity    Days per week: Not on file    Minutes per session: Not on file  . Stress: Not on file  Relationships  . Social Herbalist on phone: Not on file    Gets together: Not on file    Attends religious service: Not on file    Active member of club or organization: Not on file    Attends meetings of clubs or  organizations: Not on file    Relationship status: Not on file  Other Topics Concern  . Not on file  Social History Narrative  . Not on file   Family History: Family History  Problem Relation Age of Onset  . Hypertension Father   . Hyperlipidemia Father   . Cancer Maternal Grandmother        breast cancer  . Cancer Maternal Grandfather        lung cancer  . Alzheimer's disease Paternal Grandmother   . Cancer Paternal Grandfather        lung cancer   Allergies: No Known Allergies Medications: See med rec.  Review of Systems: No fevers, chills, night sweats, weight loss, chest pain, or shortness of breath.   Objective:    General: Well Developed, well nourished, and in no acute distress.  Neuro: Alert and oriented x3, extra-ocular muscles intact, sensation grossly intact.  HEENT: Normocephalic, atraumatic, pupils equal round reactive to light, neck supple, no masses, no lymphadenopathy, thyroid nonpalpable.  Skin: Warm and dry, no rashes. Cardiac: Regular rate and rhythm, no murmurs rubs or gallops, no lower extremity edema.  Respiratory: Clear to auscultation bilaterally. Not using accessory muscles, speaking in full sentences.  Procedure: Real-time Ultrasound Guided injection of the right knee Device:  GE Logiq E  Verbal informed consent obtained.  Time-out conducted.  Noted no overlying erythema, induration, or other signs of local infection.  Skin prepped in a sterile fashion.  Local anesthesia: Topical Ethyl chloride.  With sterile technique and under real time ultrasound guidance:  1 cc Kenalog 40, 2 cc lidocaine, 2 cc bupivacaine injected easily Completed without difficulty  Pain immediately resolved suggesting accurate placement of the medication.  Advised to call if fevers/chills, erythema, induration, drainage, or persistent bleeding.  Images permanently stored and available for review in the ultrasound unit.  Impression: Technically successful ultrasound guided  injection.  Impression and Recommendations:    Effusion of right knee MRI shows a split tear in the meniscus, injection as above. Return to see me in a month, referral for arthroscopy if no better.   ___________________________________________ Ihor Austinhomas J. Benjamin Stainhekkekandam, M.D., ABFM., CAQSM. Primary Care and Sports Medicine [facility]  Adjunct Professor of Family Medicine  [employer]

## 2019-07-11 NOTE — Assessment & Plan Note (Signed)
MRI shows a split tear in the meniscus, injection as above. Return to see me in a month, referral for arthroscopy if no better.

## 2019-08-08 ENCOUNTER — Ambulatory Visit (INDEPENDENT_AMBULATORY_CARE_PROVIDER_SITE_OTHER): Payer: PRIVATE HEALTH INSURANCE | Admitting: Sports Medicine

## 2019-08-08 ENCOUNTER — Other Ambulatory Visit: Payer: Self-pay

## 2019-08-08 DIAGNOSIS — Z9889 Other specified postprocedural states: Secondary | ICD-10-CM | POA: Diagnosis not present

## 2019-08-08 DIAGNOSIS — M25461 Effusion, right knee: Secondary | ICD-10-CM

## 2019-08-08 NOTE — Progress Notes (Signed)
Subjective:    CC: Follow-up  HPI: Whitney Turner returns, she is a pleasant 43 year old female.  She had a right knee effusion, MRI ultimately showed a small meniscal fissure, we injected her knee and she returns today completely pain-free.  She does have a history of a left ACL reconstruction, she has started to have some pain in her left knee, moderate, persistent in the medial joint line without trauma or mechanical symptoms.  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  She desires aggressive interventional treatment today.  I reviewed the past medical history, family history, social history, surgical history, and allergies today and no changes were needed.  Please see the problem list section below in epic for further details.  Past Medical History: Past Medical History:  Diagnosis Date  . Allergy   . Heart murmur   . Herpes    Past Surgical History: Past Surgical History:  Procedure Laterality Date  . HERNIA REPAIR  1982  . KNEE SURGERY  08/2011  . TONSILECTOMY/ADENOIDECTOMY WITH MYRINGOTOMY  2000   Social History: Social History   Socioeconomic History  . Marital status: Married    Spouse name: Not on file  . Number of children: Not on file  . Years of education: Not on file  . Highest education level: Not on file  Occupational History  . Not on file  Social Needs  . Financial resource strain: Not on file  . Food insecurity    Worry: Not on file    Inability: Not on file  . Transportation needs    Medical: Not on file    Non-medical: Not on file  Tobacco Use  . Smoking status: Never Smoker  . Smokeless tobacco: Never Used  Substance and Sexual Activity  . Alcohol use: Yes    Comment: Socially  . Drug use: Never  . Sexual activity: Yes    Birth control/protection: Condom  Lifestyle  . Physical activity    Days per week: Not on file    Minutes per session: Not on file  . Stress: Not on file  Relationships  . Social Herbalist  on phone: Not on file    Gets together: Not on file    Attends religious service: Not on file    Active member of club or organization: Not on file    Attends meetings of clubs or organizations: Not on file    Relationship status: Not on file  Other Topics Concern  . Not on file  Social History Narrative  . Not on file   Family History: Family History  Problem Relation Age of Onset  . Hypertension Father   . Hyperlipidemia Father   . Cancer Maternal Grandmother        breast cancer  . Cancer Maternal Grandfather        lung cancer  . Alzheimer's disease Paternal Grandmother   . Cancer Paternal Grandfather        lung cancer   Allergies: No Known Allergies Medications: See med rec.  Review of Systems: No fevers, chills, night sweats, weight loss, chest pain, or shortness of breath.   Objective:    General: Well Developed, well nourished, and in no acute distress.  Neuro: Alert and oriented x3, extra-ocular muscles intact, sensation grossly intact.  HEENT: Normocephalic, atraumatic, pupils equal round reactive to light, neck supple, no masses, no lymphadenopathy, thyroid nonpalpable.  Skin: Warm and dry, no rashes. Cardiac: Regular rate and rhythm, no murmurs  rubs or gallops, no lower extremity edema.  Respiratory: Clear to auscultation bilaterally. Not using accessory muscles, speaking in full sentences. Left knee: Normal to inspection with no erythema or effusion or obvious bony abnormalities. Palpation normal with no warmth or joint line tenderness or patellar tenderness or condyle tenderness. ROM normal in flexion and extension and lower leg rotation. Ligaments with solid consistent endpoints including PCL, LCL, MCL all intact, she does have a loose ACL. Negative Mcmurray's and provocative meniscal tests. Non painful patellar compression. Patellar and quadriceps tendons unremarkable. Hamstring and quadriceps strength is normal.  Procedure: Real-time Ultrasound Guided  injection of the left knee Device: GE Logiq E  Verbal informed consent obtained.  Time-out conducted.  Noted no overlying erythema, induration, or other signs of local infection.  Skin prepped in a sterile fashion.  Local anesthesia: Topical Ethyl chloride.  With sterile technique and under real time ultrasound guidance:  1 cc Kenalog 40, 2 cc lidocaine, 2 cc bupivacaine injected easily into the suprapatellar recess. Completed without difficulty  Pain immediately resolved suggesting accurate placement of the medication.  Advised to call if fevers/chills, erythema, induration, drainage, or persistent bleeding.  Images permanently stored and available for review in the ultrasound unit.  Impression: Technically successful ultrasound guided injection.  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 Referral to physical therapy for a single episode to teach her to Kinesiotape, this is worked well in the past. Injection today per her request. She does have a bit of arthritis on x-rays. If insufficient improvement in 1 month we will repeat an MRI as she does have a loose exam today.  Effusion of right knee MRI showed a split tear in the meniscus, injection has resolved all symptoms.   ___________________________________________ Ihor Austinhomas J. Benjamin Stainhekkekandam, M.D., ABFM., CAQSM. Primary Care and Sports Medicine [facility]  Adjunct Professor of Family Medicine  [employer]

## 2019-08-08 NOTE — Assessment & Plan Note (Signed)
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 Referral to physical therapy for a single episode to teach her to Kinesiotape, this is worked well in the past. Injection today per her request. She does have a bit of arthritis on x-rays. If insufficient improvement in 1 month we will repeat an MRI as she does have a loose exam today.

## 2019-08-08 NOTE — Assessment & Plan Note (Signed)
MRI showed a split tear in the meniscus, injection has resolved all symptoms.

## 2019-08-10 ENCOUNTER — Other Ambulatory Visit: Payer: Self-pay

## 2019-08-10 ENCOUNTER — Encounter: Payer: Self-pay | Admitting: Physical Therapy

## 2019-08-10 ENCOUNTER — Ambulatory Visit (INDEPENDENT_AMBULATORY_CARE_PROVIDER_SITE_OTHER): Payer: PRIVATE HEALTH INSURANCE | Admitting: Physical Therapy

## 2019-08-10 DIAGNOSIS — M25561 Pain in right knee: Secondary | ICD-10-CM

## 2019-08-10 DIAGNOSIS — M25562 Pain in left knee: Secondary | ICD-10-CM | POA: Diagnosis not present

## 2019-08-10 DIAGNOSIS — M6281 Muscle weakness (generalized): Secondary | ICD-10-CM

## 2019-08-10 NOTE — Patient Instructions (Signed)
Access Code: [email or web address]  URL: [email or web address]  Date: 08/10/2019  Prepared by: Faustino Congress   Exercises  Single Leg Running Balance - 10 reps - 3 sets - 10-20 sec hold - 1x daily - 7x weekly  Single Leg Squat - 10 reps - 3 sets - 1x daily - 7x weekly  Forward T - 10 reps - 3 sets - 1x daily - 7x weekly  Single Leg Deadlift with Kettlebell - 10 reps - 3 sets - 1x daily - 7x weekly  Forward Reach - 10 reps - 3 sets - 1x daily - 7x weekly  Closed Chain Single Leg Hamstring with Resistance - 10 reps - 3 sets - 1x daily - 7x weekly

## 2019-08-10 NOTE — Therapy (Signed)
[facility] [address] [place], [place], [address] Phone: [phone]   Fax:  [phone]  Physical Therapy Evaluation  Patient Details  Name: Whitney Turner MRN: [id number] Date of Birth: 1976-05-01 Referring Provider (PT): Monica Bectonhekkekandam, Thomas J, MD   Encounter Date: 08/10/2019  PT End of Session - 08/10/19 0911    Visit Number  1    Date for PT Re-Evaluation  09/21/19    PT Start Time  0802    PT Stop Time  0840    PT Time Calculation (min)  38 min    Activity Tolerance  Patient tolerated treatment well    Behavior During Therapy  [facility] for tasks assessed/performed       Past Medical History:  Diagnosis Date  . Allergy   . Heart murmur   . Herpes     Past Surgical History:  Procedure Laterality Date  . HERNIA REPAIR  1982  . KNEE SURGERY  08/2011  . TONSILECTOMY/ADENOIDECTOMY WITH MYRINGOTOMY  2000    There were no vitals filed for this visit.   Subjective Assessment - 08/10/19 0807    Subjective  Pt is a 43 y/o female who presents to OPPT for bil knee pain, with hx of Lt ACL repair.  Pt went to MD for Rt knee pain and had injection with MRI (meniscus tear), and now Lt knee is painful (meniscus tear).  Pt with recent injection in Lt knee as well.    Diagnostic tests  MRI Rt knee: meniscus tear    Patient Stated Goals  learn taping for knees         [facility] - 08/10/19 0810      Assessment   Medical Diagnosis  Z98.890 (ICD-10-CM) - History of repair of ACL    Referring Provider (PT)  Monica Bectonhekkekandam, Thomas J, MD    Onset Date/Surgical Date  --   Apr 2020   Hand Dominance  Right    Next MD Visit  09/05/2019    Prior Therapy  at this clinic      Precautions   Precautions  None      Restrictions   Weight Bearing Restrictions  No      Balance Screen   Has the patient fallen in the past 6 months  No    Has the patient had a decrease in activity level because of a fear of falling?   No     Is the patient reluctant to leave their home because of a fear of falling?   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    Leisure  gym 4-5 days/wk; running 2-3 days/wk      Functional Tests   Functional tests  Single Leg Squat      Single Leg Squat   Comments  increased instability with Lt knee internal rotation       ROM / Strength   AROM / PROM / Strength  AROM;Strength      Strength   Strength Assessment Site  Hip;Knee    Right/Left Hip  Right;Left    Right Hip Flexion  5/5    Right Hip Extension  5/5    Right Hip ABduction  5/5    Right  Hip ADduction  5/5    Left Hip Flexion  5/5    Left Hip Extension  4/5    Left Hip ABduction  4/5    Right/Left Knee  Right;Left    Right Knee Flexion  5/5    Right Knee Extension  5/5    Left Knee Flexion  3+/5    Left Knee Extension  5/5                Objective measurements completed on examination: See above findings.      [facility] Adult PT Treatment/Exercise - 08/10/19 0908      Self-Care   Self-Care  Other Self-Care Comments    Other Self-Care Comments   issued gym program to focus on hamstring and glute med strengthening for LLE; educated in tape application for home      Manual Therapy   Manual Therapy  Taping    Kinesiotex  Facilitate Muscle      Kinesiotix   Facilitate Muscle   dynamic tape to bil knees; lateral anchor with 30% stretch to proximal and distal patella; additional strip on 30% stretch lateral to medial across patella             PT Education - 08/10/19 0911    Education Details  HEP, taping    Person(s) Educated  Patient    Methods  Explanation;Demonstration;Handout    Comprehension  Verbalized understanding;Returned demonstration          PT Long Term Goals - 08/10/19 0915      PT  LONG TERM GOAL #1   Title  to be determined if pt returns             Plan - 08/10/19 0911    Clinical Impression Statement  Pt is a 43 y/o female who presents to OPPT for onset of bil knee pain, likely due to inactivity with gym closures and now trying to return to exercise.  Pt demonstrates LLE weakness with SLS instability; and requested tape education today.  Issued HEP to address weakness, discussed appropriate return to physical activity, and educated on proper tape application.  At this time will hold PT and pt to call if needed.    Personal Factors and Comorbidities  Comorbidity 2;Past/Current Experience    Comorbidities  hx ACL repair, OA    Examination-Activity Limitations  Squat;Stairs;Other   running   Stability/Clinical Decision Making  Stable/Uncomplicated    Clinical Decision Making  Low    Rehab Potential  Good    PT Frequency  1x / week   PRN   PT Duration  6 weeks    PT Treatment/Interventions  ADLs/Self Care Home Management;Cryotherapy;Electrical Stimulation;Moist Heat;Therapeutic exercise;Therapeutic activities;Functional mobility training;Stair training;Gait training;Ultrasound;Patient/family education;Manual techniques;Taping;Passive range of motion    PT Next Visit Plan  if pt returns; reassess and tx PRN    PT Home Exercise Plan  Access Code: [email or web address]    Consulted and Agree with Plan of Care  Patient       Patient will benefit from skilled therapeutic intervention in order to improve the following deficits and impairments:  Decreased strength, Pain  Visit Diagnosis: Acute pain of left knee - Plan: PT plan of care cert/re-cert  Acute pain of right knee - Plan: PT plan of care cert/re-cert  Muscle weakness (generalized) - Plan: PT plan of care cert/re-cert     Problem List Patient Active Problem List   Diagnosis Date Noted  . Effusion of right knee 06/25/2019  .  Hemorrhoids 12/04/2018  . CIN I (cervical intraepithelial neoplasia I) 04/03/2018  .  Lateral epicondylitis of left elbow 02/15/2018  . Left hand pain 02/23/2016  . History of repair of ACL of Left knee 01/26/2016  . Plantar fasciitis, bilateral 12/29/2015  . Genital herpes 12/01/2012  . 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 Allergic rhinitis 12/01/2012  . Seasonal allergies 12/01/2012      Laureen Abrahams, PT, DPT 08/10/19 9:17 AM    [facility] [address] [place], [place], [address] Phone: [phone]   Fax:  [phone]  Name: Whitney Turner MRN: [id number] Date of Birth: 03/20/1976

## 2019-09-05 ENCOUNTER — Ambulatory Visit: Payer: PRIVATE HEALTH INSURANCE | Admitting: Sports Medicine

## 2019-09-12 ENCOUNTER — Ambulatory Visit (INDEPENDENT_AMBULATORY_CARE_PROVIDER_SITE_OTHER): Payer: PRIVATE HEALTH INSURANCE | Admitting: Obstetrics & Gynecology

## 2019-09-12 ENCOUNTER — Encounter: Payer: Self-pay | Admitting: Obstetrics & Gynecology

## 2019-09-12 ENCOUNTER — Other Ambulatory Visit: Payer: Self-pay

## 2019-09-12 VITALS — Ht 61.0 in | Wt 122.0 lb

## 2019-09-12 DIAGNOSIS — N938 Other specified abnormal uterine and vaginal bleeding: Secondary | ICD-10-CM

## 2019-09-12 MED ORDER — NORGESTREL-ETHINYL ESTRADIOL 0.3-30 MG-MCG PO TABS: 1.0000 | ORAL_TABLET | Freq: Every day | ORAL | 11 refills | Status: DC

## 2019-09-12 NOTE — Progress Notes (Signed)
   TELEHEALTH VIRTUAL GYNECOLOGY VISIT ENCOUNTER NOTE  I connected with Whitney Turner on 09/12/19 at  9:00 AM EDT by telephone at home and verified that I am speaking with the correct person using two identifiers.   I discussed the limitations, risks, security and privacy concerns of performing an evaluation and management service by telephone and the availability of in person appointments. I also discussed with the patient that there may be a patient responsible charge related to this service. The patient expressed understanding and agreed to proceed.   History:  Whitney Turner is a 43 y.o. [phone] female being evaluated today for follow up on her OCPs prescribed for dysmenorrhea. She did well for 2 months and then started her period a week early, during the active pills. She then bled again a week later. She stopped her OCPs then. She has not had sex since then. Her periods have been lighter and less painful since she tried OCPs.   She is separated and has a boyfriend. She has not been on antibiotics. She has not had STI testing since having sex with boyfriend.    She is uncertain if she wants more kids.   Past Medical History:  Diagnosis Date  . Allergy   . Heart murmur   . Herpes    Past Surgical History:  Procedure Laterality Date  . HERNIA REPAIR  1982  . KNEE SURGERY  08/2011  . TONSILECTOMY/ADENOIDECTOMY WITH MYRINGOTOMY  2000   The following portions of the patient's history were reviewed and updated as appropriate: allergies, current medications, past family history, past medical history, past social history, past surgical history and problem list.     Review of Systems:  Pertinent items noted in HPI and remainder of comprehensive ROS otherwise negative.  Physical Exam:   General:  Alert, oriented and cooperative.   Mental Status: Normal mood and affect perceived. Normal judgment and thought content.  Physical exam deferred due to nature of the encounter  Labs and  Imaging No results found for this or any previous visit (from the past 336 hour(s)). No results found.    Assessment and Plan:     DUB- I rec'd a gyn u/s and a self swab at her convenience.  Her BP will be checked on that day.  She wants to restart OCPs- rec start with NMP first day      I discussed the assessment and treatment plan with the patient. The patient was provided an opportunity to ask questions and all were answered. The patient agreed with the plan and demonstrated an understanding of the instructions.   The patient was advised to call back or seek an in-person evaluation/go to the ED if the symptoms worsen or if the condition fails to improve as anticipated.  I provided 15 minutes of non-face-to-face time during this encounter.   Emily Filbert, MD [facility] [place]

## 2019-10-04 ENCOUNTER — Telehealth: Payer: Self-pay

## 2019-10-04 ENCOUNTER — Other Ambulatory Visit: Payer: Self-pay | Admitting: Sports Medicine

## 2019-10-04 DIAGNOSIS — Z201 Contact with and (suspected) exposure to tuberculosis: Secondary | ICD-10-CM

## 2019-10-04 DIAGNOSIS — M25461 Effusion, right knee: Secondary | ICD-10-CM

## 2019-10-04 NOTE — Telephone Encounter (Signed)
Signed. Ok to go to lab.

## 2019-10-04 NOTE — Telephone Encounter (Signed)
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  Patient wants to know if she can have a blood draw test done to see if she may have it. States that infected person was a-symptomatic.   Please advise

## 2019-10-04 NOTE — Telephone Encounter (Signed)
Pt advised.

## 2019-10-12 LAB — QUANTIFERON-TB GOLD PLUS
Mitogen-NIL: >10 IU/mL
NIL: 0.03 IU/mL
QuantiFERON-TB Gold Plus: NEGATIVE
TB1-NIL: 0 IU/mL
TB2-NIL: 0 IU/mL

## 2019-10-12 NOTE — Telephone Encounter (Signed)
Negative TB screen.

## 2019-10-16 ENCOUNTER — Telehealth: Payer: Self-pay | Admitting: *Deleted

## 2019-10-16 MED ORDER — NORGESTIMATE-ETH ESTRADIOL 0.25-35 MG-MCG PO TABS: 1.0000 | ORAL_TABLET | Freq: Every day | ORAL | 11 refills | Status: DC

## 2019-10-16 NOTE — Telephone Encounter (Signed)
Pt called the office stating that she has decided that she doesn't want a pelvic U/S at this time.  She wants to change her OCP's and see if that changes the irregular bleeding.  OK with Dr Hulan Fray to switch to [place] and see how that does for a couple of months.  If bleeding is still happening then pt knows that she will need to have the U/S to complete the workup.

## 2019-12-10 ENCOUNTER — Ambulatory Visit (INDEPENDENT_AMBULATORY_CARE_PROVIDER_SITE_OTHER): Payer: PRIVATE HEALTH INSURANCE | Admitting: Obstetrics & Gynecology

## 2019-12-10 ENCOUNTER — Encounter: Payer: Self-pay | Admitting: Obstetrics & Gynecology

## 2019-12-10 ENCOUNTER — Other Ambulatory Visit: Payer: Self-pay

## 2019-12-10 VITALS — BP 110/69 | HR 79 | Temp 98.2°F | Resp 16 | Ht 61.0 in | Wt 127.0 lb

## 2019-12-10 DIAGNOSIS — N938 Other specified abnormal uterine and vaginal bleeding: Secondary | ICD-10-CM | POA: Diagnosis not present

## 2019-12-10 DIAGNOSIS — Z3202 Encounter for pregnancy test, result negative: Secondary | ICD-10-CM

## 2019-12-10 LAB — POCT URINE PREGNANCY: Preg Test, Ur: NEGATIVE

## 2019-12-10 NOTE — Progress Notes (Signed)
   Subjective:    Patient ID: Whitney Turner, female    DOB: 1976/10/26, 44 y.o.   MRN: [id number]  HPI  [name] is a 44 y.o. female here for a f/u of bleeding on birth control.  August-October.  Pt took two pills for awhile which helped.  She switched birth control pills in NOvember (thought different formulation would help with bleeding, hair loss, and moodiness).  Pt was given "wrong birth control" at [facility] and they wouldn't refill it with correct pill.  Pt decided to "reset".  LMP 11/29/19--nml for 4 days and on day 5 it was heavier.  Continue to be heavy on Day 6-8.    Review of Systems  Constitutional: Negative.   Respiratory: Negative.   Cardiovascular: Negative.   Gastrointestinal: Negative.   Genitourinary: Negative.        Objective:   Physical Exam Vitals reviewed.  Constitutional:      General: She is not in acute distress.    Appearance: She is well-developed.  HENT:     Head: Normocephalic and atraumatic.  Eyes:     Conjunctiva/sclera: Conjunctivae normal.  Cardiovascular:     Rate and Rhythm: Normal rate.  Pulmonary:     Effort: Pulmonary effort is normal.  Abdominal:     General: Bowel sounds are normal. There is no distension.     Palpations: Abdomen is soft. There is no mass.     Tenderness: There is no abdominal tenderness. There is no guarding or rebound.  Genitourinary:    Comments: Tanner V Vulva--no lesion Cervix--friable ectropion Uterus--retroverted, nml size, nontender Adnexa--no masses, non tender Skin:    General: Skin is warm and dry.  Neurological:     Mental Status: She is alert and oriented to person, place, and time.       Assessment & Plan:  44 yo female with menorrhagia off OCP and dome bleeding on OCPs although has not been on one brand of birth control for longer than 2-3 months  1--Gyn [place] complete with TVUS 2--Start OCPs today 3--RTC after [place] to discuss bleeding and possibility of IUD.    20 minutes spent face-to-face with patient  with greater than 50% counseling.

## 2019-12-18 ENCOUNTER — Ambulatory Visit: Payer: PRIVATE HEALTH INSURANCE

## 2019-12-18 ENCOUNTER — Other Ambulatory Visit: Payer: PRIVATE HEALTH INSURANCE

## 2019-12-18 ENCOUNTER — Ambulatory Visit (INDEPENDENT_AMBULATORY_CARE_PROVIDER_SITE_OTHER): Payer: PRIVATE HEALTH INSURANCE

## 2019-12-18 ENCOUNTER — Other Ambulatory Visit: Payer: Self-pay

## 2019-12-18 DIAGNOSIS — N938 Other specified abnormal uterine and vaginal bleeding: Secondary | ICD-10-CM | POA: Diagnosis not present

## 2019-12-20 ENCOUNTER — Telehealth: Payer: Self-pay | Admitting: Neurology

## 2019-12-20 NOTE — Telephone Encounter (Signed)
Patient left vm stating she needs signed off from [place] stating she is in overall good health so she can attend some conferences for work.  Patient is due for annual physical, has not been seen in a year.   Shanda Bumps - please call to set up visit with [facility] for patient. Thanks!

## 2019-12-20 NOTE — Telephone Encounter (Signed)
Appointment has been made. No further questions.  °

## 2019-12-21 ENCOUNTER — Ambulatory Visit (INDEPENDENT_AMBULATORY_CARE_PROVIDER_SITE_OTHER): Payer: PRIVATE HEALTH INSURANCE | Admitting: Physician Assistant

## 2019-12-21 ENCOUNTER — Other Ambulatory Visit: Payer: Self-pay

## 2019-12-21 VITALS — BP 122/85 | HR 92 | Ht 61.0 in | Wt 127.0 lb

## 2019-12-21 DIAGNOSIS — R1011 Right upper quadrant pain: Secondary | ICD-10-CM

## 2019-12-21 DIAGNOSIS — E7800 Pure hypercholesterolemia, unspecified: Secondary | ICD-10-CM | POA: Diagnosis not present

## 2019-12-21 DIAGNOSIS — Z0000 Encounter for general adult medical examination without abnormal findings: Secondary | ICD-10-CM

## 2019-12-21 DIAGNOSIS — Z131 Encounter for screening for diabetes mellitus: Secondary | ICD-10-CM | POA: Diagnosis not present

## 2019-12-21 DIAGNOSIS — R1031 Right lower quadrant pain: Secondary | ICD-10-CM

## 2019-12-21 DIAGNOSIS — Z1231 Encounter for screening mammogram for malignant neoplasm of breast: Secondary | ICD-10-CM

## 2019-12-21 NOTE — Patient Instructions (Signed)
Health Maintenance, Female Adopting a healthy lifestyle and getting preventive care are important in promoting health and wellness. Ask your health care provider about:  The right schedule for you to have regular tests and exams.  Things you can do on your own to prevent diseases and keep yourself healthy. What should I know about diet, weight, and exercise? Eat a healthy diet   Eat a diet that includes plenty of vegetables, fruits, low-fat dairy products, and lean protein.  Do not eat a lot of foods that are high in solid fats, added sugars, or sodium. Maintain a healthy weight Body mass index (BMI) is used to identify weight problems. It estimates body fat based on height and weight. Your health care provider can help determine your BMI and help you achieve or maintain a healthy weight. Get regular exercise Get regular exercise. This is one of the most important things you can do for your health. Most adults should:  Exercise for at least 150 minutes each week. The exercise should increase your heart rate and make you sweat (moderate-intensity exercise).  Do strengthening exercises at least twice a week. This is in addition to the moderate-intensity exercise.  Spend less time sitting. Even light physical activity can be beneficial. Watch cholesterol and blood lipids Have your blood tested for lipids and cholesterol at 42 years of age, then have this test every 5 years. Have your cholesterol levels checked more often if:  Your lipid or cholesterol levels are high.  You are older than 42 years of age.  You are at high risk for heart disease. What should I know about cancer screening? Depending on your health history and family history, you may need to have cancer screening at various ages. This may include screening for:  Breast cancer.  Cervical cancer.  Colorectal cancer.  Skin cancer.  Lung cancer. What should I know about heart disease, diabetes, and high blood  pressure? Blood pressure and heart disease  High blood pressure causes heart disease and increases the risk of stroke. This is more likely to develop in people who have high blood pressure readings, are of African descent, or are overweight.  Have your blood pressure checked: ? Every 3-5 years if you are 18-39 years of age. ? Every year if you are 40 years old or older. Diabetes Have regular diabetes screenings. This checks your fasting blood sugar level. Have the screening done:  Once every three years after age 40 if you are at a normal weight and have a low risk for diabetes.  More often and at a younger age if you are overweight or have a high risk for diabetes. What should I know about preventing infection? Hepatitis B If you have a higher risk for hepatitis B, you should be screened for this virus. Talk with your health care provider to find out if you are at risk for hepatitis B infection. Hepatitis C Testing is recommended for:  Everyone born from 1945 through 1965.  Anyone with known risk factors for hepatitis C. Sexually transmitted infections (STIs)  Get screened for STIs, including gonorrhea and chlamydia, if: ? You are sexually active and are younger than 42 years of age. ? You are older than 42 years of age and your health care provider tells you that you are at risk for this type of infection. ? Your sexual activity has changed since you were last screened, and you are at increased risk for chlamydia or gonorrhea. Ask your health care provider if   you are at risk.  Ask your health care provider about whether you are at high risk for HIV. Your health care provider may recommend a prescription medicine to help prevent HIV infection. If you choose to take medicine to prevent HIV, you should first get tested for HIV. You should then be tested every 3 months for as long as you are taking the medicine. Pregnancy  If you are about to stop having your period (premenopausal) and  you may become pregnant, seek counseling before you get pregnant.  Take 400 to 800 micrograms (mcg) of folic acid every day if you become pregnant.  Ask for birth control (contraception) if you want to prevent pregnancy. Osteoporosis and menopause Osteoporosis is a disease in which the bones lose minerals and strength with aging. This can result in bone fractures. If you are 65 years old or older, or if you are at risk for osteoporosis and fractures, ask your health care provider if you should:  Be screened for bone loss.  Take a calcium or vitamin D supplement to lower your risk of fractures.  Be given hormone replacement therapy (HRT) to treat symptoms of menopause. Follow these instructions at home: Lifestyle  Do not use any products that contain nicotine or tobacco, such as cigarettes, e-cigarettes, and chewing tobacco. If you need help quitting, ask your health care provider.  Do not use street drugs.  Do not share needles.  Ask your health care provider for help if you need support or information about quitting drugs. Alcohol use  Do not drink alcohol if: ? Your health care provider tells you not to drink. ? You are pregnant, may be pregnant, or are planning to become pregnant.  If you drink alcohol: ? Limit how much you use to 0-1 drink a day. ? Limit intake if you are breastfeeding.  Be aware of how much alcohol is in your drink. In the [place] one drink equals one 12 oz bottle of beer (355 mL), one 5 oz glass of wine (148 mL), or one 1 oz glass of hard liquor (44 mL). General instructions  Schedule regular health, dental, and eye exams.  Stay current with your vaccines.  Tell your health care provider if: ? You often feel depressed. ? You have ever been abused or do not feel safe at home. Summary  Adopting a healthy lifestyle and getting preventive care are important in promoting health and wellness.  Follow your health care provider's instructions about healthy  diet, exercising, and getting tested or screened for diseases.  Follow your health care provider's instructions on monitoring your cholesterol and blood pressure. This information is not intended to replace advice given to you by your health care provider. Make sure you discuss any questions you have with your health care provider. Document Revised: 11/01/2018 Document Reviewed: 11/01/2018 Elsevier Patient Education  2020 Elsevier Inc.  

## 2019-12-24 ENCOUNTER — Ambulatory Visit: Payer: PRIVATE HEALTH INSURANCE

## 2019-12-24 ENCOUNTER — Encounter: Payer: Self-pay | Admitting: Physician Assistant

## 2019-12-24 ENCOUNTER — Encounter: Payer: Self-pay | Admitting: Obstetrics & Gynecology

## 2019-12-24 ENCOUNTER — Ambulatory Visit (INDEPENDENT_AMBULATORY_CARE_PROVIDER_SITE_OTHER): Payer: PRIVATE HEALTH INSURANCE | Admitting: Obstetrics & Gynecology

## 2019-12-24 ENCOUNTER — Other Ambulatory Visit: Payer: Self-pay

## 2019-12-24 VITALS — BP 122/80 | HR 84 | Temp 98.3°F | Resp 16 | Ht 61.0 in | Wt 127.0 lb

## 2019-12-24 DIAGNOSIS — Z113 Encounter for screening for infections with a predominantly sexual mode of transmission: Secondary | ICD-10-CM | POA: Diagnosis not present

## 2019-12-24 DIAGNOSIS — N898 Other specified noninflammatory disorders of vagina: Secondary | ICD-10-CM

## 2019-12-24 DIAGNOSIS — B373 Candidiasis of vulva and vagina: Secondary | ICD-10-CM | POA: Diagnosis not present

## 2019-12-24 DIAGNOSIS — A749 Chlamydial infection, unspecified: Secondary | ICD-10-CM | POA: Diagnosis not present

## 2019-12-24 DIAGNOSIS — N926 Irregular menstruation, unspecified: Secondary | ICD-10-CM | POA: Diagnosis not present

## 2019-12-24 DIAGNOSIS — N9489 Other specified conditions associated with female genital organs and menstrual cycle: Secondary | ICD-10-CM | POA: Diagnosis not present

## 2019-12-24 DIAGNOSIS — R109 Unspecified abdominal pain: Secondary | ICD-10-CM

## 2019-12-24 DIAGNOSIS — N760 Acute vaginitis: Secondary | ICD-10-CM | POA: Diagnosis not present

## 2019-12-24 DIAGNOSIS — E7800 Pure hypercholesterolemia, unspecified: Secondary | ICD-10-CM | POA: Insufficient documentation

## 2019-12-24 DIAGNOSIS — R1031 Right lower quadrant pain: Secondary | ICD-10-CM | POA: Insufficient documentation

## 2019-12-24 DIAGNOSIS — B9689 Other specified bacterial agents as the cause of diseases classified elsewhere: Secondary | ICD-10-CM

## 2019-12-24 DIAGNOSIS — R1011 Right upper quadrant pain: Secondary | ICD-10-CM | POA: Insufficient documentation

## 2019-12-24 MED ORDER — MEGESTROL ACETATE 40 MG PO TABS: ORAL_TABLET | ORAL | 0 refills | Status: DC

## 2019-12-24 NOTE — Progress Notes (Signed)
   Subjective:    Patient ID: Whitney Turner, female    DOB: [birth year], 44 y.o.   MRN: [id number]  HPI  44 yo female presents for bleeding and pain.  Pt has bled every day since last visit.  Pt had side ache which is now much worse.  Pt has history of PID after having a retained tampon.  Pt on OCPs and took up until 3 days ago.  (Took two weeks toal since last visit).  Pts pain is mostly upper abdomen and has been unbearable at times.  It is worse at nihgt when she lays prone and on deep inspiration.  Pt has new onset diarrhea.  No N/V.     Review of Systems  Constitutional: Negative.   Respiratory: Negative.   Cardiovascular: Negative.   Gastrointestinal: Positive for abdominal pain and diarrhea.  Genitourinary: Positive for menstrual problem and vaginal bleeding.  Neurological: Positive for dizziness.  Psychiatric/Behavioral: The patient is nervous/anxious.        Objective:   Physical Exam Vitals reviewed.  Constitutional:      General: She is not in acute distress.    Appearance: She is well-developed.  HENT:     Head: Normocephalic and atraumatic.  Eyes:     Conjunctiva/sclera: Conjunctivae normal.  Cardiovascular:     Rate and Rhythm: Normal rate.     Pulses: Normal pulses.  Pulmonary:     Effort: Pulmonary effort is normal.  Abdominal:     General: Abdomen is flat. There is no distension.     Palpations: Abdomen is soft. There is no mass.     Tenderness: There is no abdominal tenderness. There is no right CVA tenderness, left CVA tenderness, guarding or rebound.  Genitourinary:    Comments: Tanner V Vagina:  Pink nml rugae, small amt of blood Cervix:  Blood at os, friable extropianand blood coming from os. Uterus:  Anteverted, nml size, no CMT Adnexa:  Normal size, no pain on bimanual; both ovaries felt.   Musculoskeletal:     Cervical back: Neck supple.  Skin:    General: Skin is warm and dry.  Neurological:     Mental Status: She is alert and oriented to  person, place, and time.  Psychiatric:        Mood and Affect: Mood normal.    Vitals:   12/24/19 1340  BP: 122/80  Pulse: 84  Resp: 16  Temp: 98.3 F (36.8 C)  Weight: 127 lb (57.6 kg)  Height: 5\' 1"  (1.549 m)    Assessment & Plan:  44 yo female with right adnexal mass, continue bleeding on OCPs, new onset right upper quadrant pain and pain with inspiration in right upper quarant.  1.  No pelvic pain on exam.  Will get MRI pelvic and abd to look for pain from ribs to uterus.  Will be able to assess right adnexa best. 2.  Will draw labs cbc, cmp ,STD testing, beta quant 3.  Megace taper for menomet.   4.  RTC in 2 weeks.   5.  Note sent to [facility] about RUQ pain and pain with inspiration.    30 minutes spent face to face with >50% counseling.

## 2019-12-24 NOTE — Progress Notes (Addendum)
Subjective:     Whitney Turner is a 44 y.o. female and is here for a comprehensive physical exam. The patient reports problems - see below.    Pt is having some right lower quadrant pain and DUB. Seeing gyn. Pelvic US showed tubular structure on the right.   Social History   Socioeconomic History  . Marital status: Married    Spouse name: Not on file  . Number of children: Not on file  . Years of education: Not on file  . Highest education level: Not on file  Occupational History  . Not on file  Tobacco Use  . Smoking status: Never Smoker  . Smokeless tobacco: Never Used  Substance and Sexual Activity  . Alcohol use: Yes    Comment: Socially  . Drug use: Never  . Sexual activity: Yes    Birth control/protection: Condom  Other Topics Concern  . Not on file  Social History Narrative  . Not on file   Social Determinants of Health   Financial Resource Strain:   . Difficulty of Paying Living Expenses: Not on file  Food Insecurity:   . Worried About Programme researcher, broadcasting/film/video in the Last Year: Not on file  . Ran Out of Food in the Last Year: Not on file  Transportation Needs:   . Lack of Transportation (Medical): Not on file  . Lack of Transportation (Non-Medical): Not on file  Physical Activity:   . Days of Exercise per Week: Not on file  . Minutes of Exercise per Session: Not on file  Stress:   . Feeling of Stress : Not on file  Social Connections:   . Frequency of Communication with Friends and Family: Not on file  . Frequency of Social Gatherings with Friends and Family: Not on file  . Attends Religious Services: Not on file  . Active Member of Clubs or Organizations: Not on file  . Attends Banker Meetings: Not on file  . Marital Status: Not on file  Intimate Partner Violence:   . Fear of Current or Ex-Partner: Not on file  . Emotionally Abused: Not on file  . Physically Abused: Not on file  . Sexually Abused: Not on file   Health Maintenance  Topic Date  Due  . INFLUENZA VACCINE  02/20/2020 (Originally 06/23/2019)  . PAP SMEAR-Modifier  07/01/2022  . TETANUS/TDAP  12/01/2022  . HIV Screening  Completed    The following portions of the patient's history were reviewed and updated as appropriate: allergies, current medications, past family history, past medical history, past social history, past surgical history and problem list.  Review of Systems Pertinent items noted in HPI and remainder of comprehensive ROS otherwise negative.   Objective:    BP 122/85   Pulse 92   Ht 5\' 1"  (1.549 m)   Wt 127 lb (57.6 kg)   LMP 12/03/2019   SpO2 100%   BMI 24.00 kg/m  General appearance: alert, cooperative and appears stated age Head: Normocephalic, without obvious abnormality, atraumatic Eyes: conjunctivae/corneas clear. PERRL, EOM's intact. Fundi benign. Ears: normal TM's and external ear canals both ears Nose: Nares normal. Septum midline. Mucosa normal. No drainage or sinus tenderness. Throat: lips, mucosa, and tongue normal; teeth and gums normal Neck: no adenopathy, no carotid bruit, no JVD, supple, symmetrical, trachea midline and thyroid not enlarged, symmetric, no tenderness/mass/nodules Back: symmetric, no curvature. ROM normal. No CVA tenderness. Lungs: clear to auscultation bilaterally Heart: regular rate and rhythm, S1, S2 normal, no murmur,  click, rub or gallop Abdomen: tenderness over right lower quadrant right upper quadrant.  no guarding, no masses, no distention. Extremities: extremities normal, atraumatic, no cyanosis or edema Pulses: 2+ and symmetric Skin: Skin color, texture, turgor normal. No rashes or lesions Lymph nodes: Cervical, supraclavicular, and axillary nodes normal. Neurologic: Alert and oriented X 3, normal strength and tone. Normal symmetric reflexes. Normal coordination and gait    Assessment:    Healthy female exam.      Plan:    Marland KitchenMarland KitchenAmber was seen today for annual exam.  Diagnoses and all orders for  this visit:  Routine physical examination -     Lipid Panel w/reflex Direct LDL -     COMPLETE METABOLIC PANEL WITH GFR -     CBC with Differential/Platelet  Elevated LDL cholesterol level -     Lipid Panel w/reflex Direct LDL  Screening for diabetes mellitus -     COMPLETE METABOLIC PANEL WITH GFR  Right lower quadrant pain  Right upper quadrant pain   .Marland Kitchen Depression screen [facility] 2/9 12/21/2019 12/04/2018 07/14/2017  Decreased Interest 0 0 0  Down, Depressed, Hopeless 0 0 0  PHQ - 2 Score 0 0 0  Altered sleeping 0 - -  Tired, decreased energy 0 - -  Change in appetite 0 - -  Feeling bad or failure about yourself  0 - -  Trouble concentrating 0 - -  Moving slowly or fidgety/restless 0 - -  Suicidal thoughts 0 - -  PHQ-9 Score 0 - -  Difficult doing work/chores Not difficult at all - -   .Marland Kitchen Discussed 150 minutes of exercise a week.  Encouraged vitamin D 1000 units and Calcium 1300mg  or 4 servings of dairy a day.  Fasting labs ordered.  Mammogram ordered.  See GYN. Follow up for pap and discussed of pelvic u/s. You need more imaging and likely the pain is coming from structure.  If pain does not resolve may need to consider more gallbladder work up.  Filled out paperwork.   See After Visit Summary for Counseling Recommendations

## 2019-12-25 ENCOUNTER — Telehealth: Payer: Self-pay | Admitting: Neurology

## 2019-12-25 LAB — CBC
HCT: 37.7 % (ref 35.0–45.0)
Hemoglobin: 12.7 g/dL (ref 11.7–15.5)
MCH: 29.9 pg (ref 27.0–33.0)
MCHC: 33.7 g/dL (ref 32.0–36.0)
MCV: 88.7 fL (ref 80.0–100.0)
MPV: 9.5 fL (ref 7.5–12.5)
Platelets: 536 10*3/uL — ABNORMAL HIGH (ref 140–400)
RBC: 4.25 10*6/uL (ref 3.80–5.10)
RDW: 11.6 % (ref 11.0–15.0)
WBC: 7 10*3/uL (ref 3.8–10.8)

## 2019-12-25 LAB — CERVICOVAGINAL ANCILLARY ONLY
Bacterial Vaginitis (gardnerella): POSITIVE — AB
Candida Glabrata: NEGATIVE
Candida Vaginitis: NEGATIVE
Chlamydia: POSITIVE — AB
Comment: NEGATIVE
Comment: NEGATIVE
Comment: NEGATIVE
Comment: NEGATIVE
Comment: NEGATIVE
Comment: NORMAL
Neisseria Gonorrhea: NEGATIVE
Trichomonas: NEGATIVE

## 2019-12-25 LAB — HEPATITIS C ANTIBODY
Hepatitis C Ab: NONREACTIVE
SIGNAL TO CUT-OFF: 0.13 (ref <1.00)

## 2019-12-25 LAB — COMPREHENSIVE METABOLIC PANEL
AG Ratio: 1.3 (calc) (ref 1.0–2.5)
ALT: 12 U/L (ref 6–29)
AST: 16 U/L (ref 10–30)
Albumin: 4.4 g/dL (ref 3.6–5.1)
Alkaline phosphatase (APISO): 49 U/L (ref 31–125)
BUN: 15 mg/dL (ref 7–25)
CO2: 29 mmol/L (ref 20–32)
Calcium: 10 mg/dL (ref 8.6–10.2)
Chloride: 103 mmol/L (ref 98–110)
Creat: 0.76 mg/dL (ref 0.50–1.10)
Globulin: 3.4 g/dL (calc) (ref 1.9–3.7)
Glucose, Bld: 121 mg/dL — ABNORMAL HIGH (ref 65–99)
Potassium: 4 mmol/L (ref 3.5–5.3)
Sodium: 140 mmol/L (ref 135–146)
Total Bilirubin: 0.2 mg/dL (ref 0.2–1.2)
Total Protein: 7.8 g/dL (ref 6.1–8.1)

## 2019-12-25 LAB — RPR: RPR Ser Ql: NONREACTIVE

## 2019-12-25 LAB — HEPATITIS B SURFACE ANTIGEN: Hepatitis B Surface Ag: NONREACTIVE

## 2019-12-25 LAB — HIV ANTIBODY (ROUTINE TESTING W REFLEX): HIV 1&2 Ab, 4th Generation: NONREACTIVE

## 2019-12-25 LAB — HCG, QUANTITATIVE, PREGNANCY: HCG, Total, QN: <3 m[IU]/mL

## 2019-12-25 NOTE — Telephone Encounter (Signed)
Patient left vm asking about the benefit of an Xray before an MRI. Has recently had physical here and been seen by OBGYN. She wanted your opinion on Xray. She states she has been having pain in right side. Can't lay on that side and hurts when she takes a deep breath or sneezes. Please advise.

## 2019-12-25 NOTE — Telephone Encounter (Signed)
Xray is going to show bones. I did not think you hurt over your ribs. If you feel like you are hurting more over your ribs we could start with xray of chest but xray of abomen does not have much yield unless you are looking for bowel obstruction.

## 2019-12-25 NOTE — Telephone Encounter (Signed)
Patient made aware. Declined chest xray.

## 2019-12-26 ENCOUNTER — Encounter: Payer: Self-pay | Admitting: *Deleted

## 2019-12-26 ENCOUNTER — Ambulatory Visit (INDEPENDENT_AMBULATORY_CARE_PROVIDER_SITE_OTHER): Payer: PRIVATE HEALTH INSURANCE

## 2019-12-26 ENCOUNTER — Other Ambulatory Visit: Payer: Self-pay

## 2019-12-26 ENCOUNTER — Other Ambulatory Visit: Payer: Self-pay | Admitting: Obstetrics & Gynecology

## 2019-12-26 DIAGNOSIS — A749 Chlamydial infection, unspecified: Secondary | ICD-10-CM | POA: Diagnosis not present

## 2019-12-26 MED ORDER — CEFTRIAXONE SODIUM 500 MG IJ SOLR: 500.0000 mg | Freq: Once | INTRAMUSCULAR | Status: DC

## 2019-12-26 MED ORDER — DOXYCYCLINE HYCLATE 100 MG PO CAPS: 100.0000 mg | ORAL_CAPSULE | Freq: Two times a day (BID) | ORAL | 0 refills | Status: AC

## 2019-12-26 MED ORDER — METRONIDAZOLE 500 MG PO TABS: 500.0000 mg | ORAL_TABLET | Freq: Two times a day (BID) | ORAL | 0 refills | Status: AC

## 2019-12-26 MED ORDER — CEFTRIAXONE SODIUM 500 MG IJ SOLR
500.0000 mg | Freq: Once | INTRAMUSCULAR | Status: AC
  Administered 2019-12-26: 500 mg via INTRAMUSCULAR

## 2019-12-26 NOTE — Progress Notes (Signed)
Pt was given 500 mg (two of the 250mg  vials) of Ceftriaxone per Dr.Leggett. Injection given and tolerated well. Pt waited 15 minutes before leaving office and states she feels fine.

## 2019-12-26 NOTE — Progress Notes (Signed)
Pt has +chlamydia and BV long pelvic pain.  Will treat as mild PID as outpatient.

## 2019-12-28 ENCOUNTER — Telehealth: Payer: Self-pay | Admitting: *Deleted

## 2019-12-28 NOTE — Telephone Encounter (Signed)
Returned call from 10:06 AM. Left patient a message to call and schedule 2 week F/U appt from 12/24/2019. Penne Lash will not be back until 01/21/2020, she might have to see another MD.

## 2020-01-01 ENCOUNTER — Other Ambulatory Visit: Payer: Self-pay

## 2020-01-01 ENCOUNTER — Ambulatory Visit (INDEPENDENT_AMBULATORY_CARE_PROVIDER_SITE_OTHER): Payer: PRIVATE HEALTH INSURANCE | Admitting: Sports Medicine

## 2020-01-01 DIAGNOSIS — M25461 Effusion, right knee: Secondary | ICD-10-CM | POA: Diagnosis not present

## 2020-01-01 NOTE — Progress Notes (Signed)
    Procedures performed today:    Procedure: Real-time Ultrasound Guided injection of the right knee Device: Samsung HS60  Verbal informed consent obtained.  Time-out conducted.  Noted no overlying erythema, induration, or other signs of local infection.  Skin prepped in a sterile fashion.  Local anesthesia: Topical Ethyl chloride.  With sterile technique and under real time ultrasound guidance: 1 cc Kenalog 40, 2 cc lidocaine, 2 cc bupivacaine injected easily Completed without difficulty  Pain immediately resolved suggesting accurate placement of the medication.  Advised to call if fevers/chills, erythema, induration, drainage, or persistent bleeding.  Images permanently stored and available for review in the ultrasound unit.  Impression: Technically successful ultrasound guided injection. Independent interpretation of tests performed by another provider:   None.  Impression and Recommendations:    Effusion of right knee This is a pleasant 44 year old female, she has a split tear in her meniscus noted on MRI from a year ago. Back in August we injected her right knee and she did well until recently, now having recurrence of pain, medial joint line, mild swelling, requesting repeat interventional treatment today. I performed an injection, she can return to see me as needed.    ___________________________________________ [name]. Benjamin Stain, M.D., ABFM., CAQSM. Primary Care and Sports Medicine [place] [facility]  Adjunct Instructor of Family Medicine  [employer]

## 2020-01-01 NOTE — Assessment & Plan Note (Signed)
This is a pleasant 44 year old female, she has a split tear in her meniscus noted on MRI from a year ago. Back in August we injected her right knee and she did well until recently, now having recurrence of pain, medial joint line, mild swelling, requesting repeat interventional treatment today. I performed an injection, she can return to see me as needed.

## 2020-01-03 ENCOUNTER — Encounter: Payer: Self-pay | Admitting: Physician Assistant

## 2020-01-03 NOTE — Progress Notes (Signed)
Whitney Turner,   Your fasting glucose was elevated. We need to add A1C to this to evaluate for diabetes.  Your HDL is great.  Your LDL is not optimal but with minimal risk factors ok but with risk factors such as diabetes would not be to goal. We will know more after we get a1c.  Kidney and liver look great.  Your white count is very high as well as your neurtophils. No eosinophils so this is not allergies. I would like to add path smear if we can. I know you were having that pain in right quadrant. I would strongly recommend doing the MRI to evaluate. Have you had that done.   [name] please add a1c and path smear.

## 2020-01-07 ENCOUNTER — Ambulatory Visit (INDEPENDENT_AMBULATORY_CARE_PROVIDER_SITE_OTHER): Payer: PRIVATE HEALTH INSURANCE | Admitting: Obstetrics & Gynecology

## 2020-01-07 ENCOUNTER — Encounter: Payer: Self-pay | Admitting: Obstetrics & Gynecology

## 2020-01-07 ENCOUNTER — Other Ambulatory Visit: Payer: Self-pay

## 2020-01-07 VITALS — BP 108/62 | HR 78 | Temp 97.8°F | Resp 16 | Ht 61.0 in | Wt 128.0 lb

## 2020-01-07 DIAGNOSIS — Z3009 Encounter for other general counseling and advice on contraception: Secondary | ICD-10-CM | POA: Diagnosis not present

## 2020-01-07 DIAGNOSIS — N730 Acute parametritis and pelvic cellulitis: Secondary | ICD-10-CM | POA: Diagnosis not present

## 2020-01-07 DIAGNOSIS — A749 Chlamydial infection, unspecified: Secondary | ICD-10-CM | POA: Diagnosis not present

## 2020-01-07 LAB — CBC WITH DIFFERENTIAL/PLATELET
Absolute Monocytes: 365 cells/uL (ref 200–950)
Basophils Absolute: 15 cells/uL (ref 0–200)
Basophils Relative: 0.1 %
Eosinophils Absolute: 0 cells/uL — ABNORMAL LOW (ref 15–500)
Eosinophils Relative: 0 %
HCT: 36.2 % (ref 35.0–45.0)
Hemoglobin: 12.2 g/dL (ref 11.7–15.5)
Lymphs Abs: 672 cells/uL — ABNORMAL LOW (ref 850–3900)
MCH: 30 pg (ref 27.0–33.0)
MCHC: 33.7 g/dL (ref 32.0–36.0)
MCV: 88.9 fL (ref 80.0–100.0)
MPV: 9.7 fL (ref 7.5–12.5)
Monocytes Relative: 2.5 %
Neutro Abs: 13549 cells/uL — ABNORMAL HIGH (ref 1500–7800)
Neutrophils Relative %: 92.8 %
Platelets: 395 10*3/uL (ref 140–400)
RBC: 4.07 10*6/uL (ref 3.80–5.10)
RDW: 11.8 % (ref 11.0–15.0)
Total Lymphocyte: 4.6 %
WBC: 14.6 10*3/uL — ABNORMAL HIGH (ref 3.8–10.8)

## 2020-01-07 LAB — COMPLETE METABOLIC PANEL WITH GFR
AG Ratio: 1.5 (calc) (ref 1.0–2.5)
ALT: 22 U/L (ref 6–29)
AST: 16 U/L (ref 10–30)
Albumin: 4.4 g/dL (ref 3.6–5.1)
Alkaline phosphatase (APISO): 41 U/L (ref 31–125)
BUN: 20 mg/dL (ref 7–25)
CO2: 23 mmol/L (ref 20–32)
Calcium: 9.6 mg/dL (ref 8.6–10.2)
Chloride: 105 mmol/L (ref 98–110)
Creat: 0.66 mg/dL (ref 0.50–1.10)
GFR, Est African American: 127 mL/min/{1.73_m2} (ref >=60)
GFR, Est Non African American: 110 mL/min/{1.73_m2} (ref >=60)
Globulin: 2.9 g/dL (calc) (ref 1.9–3.7)
Glucose, Bld: 139 mg/dL — ABNORMAL HIGH (ref 65–99)
Potassium: 4.3 mmol/L (ref 3.5–5.3)
Sodium: 135 mmol/L (ref 135–146)
Total Bilirubin: 0.3 mg/dL (ref 0.2–1.2)
Total Protein: 7.3 g/dL (ref 6.1–8.1)

## 2020-01-07 LAB — HEMOGLOBIN A1C W/OUT EAG: Hgb A1c MFr Bld: 5.1 % of total Hgb (ref <5.7)

## 2020-01-07 LAB — LIPID PANEL W/REFLEX DIRECT LDL
Cholesterol: 219 mg/dL — ABNORMAL HIGH (ref <200)
HDL: 67 mg/dL (ref >=50)
LDL Cholesterol (Calc): 143 mg/dL (calc) — ABNORMAL HIGH
Non-HDL Cholesterol (Calc): 152 mg/dL (calc) — ABNORMAL HIGH (ref <130)
Total CHOL/HDL Ratio: 3.3 (calc) (ref <5.0)
Triglycerides: 31 mg/dL (ref <150)

## 2020-01-07 LAB — PATHOLOGIST SMEAR REVIEW

## 2020-01-07 NOTE — Progress Notes (Signed)
Aradhya,   Path smear not back. A1C looks fine and in normal range.   Any update on imaging scheduled. How is right side pain?

## 2020-01-07 NOTE — Progress Notes (Signed)
   Subjective:    Patient ID: Whitney Turner, female    DOB: 07/06/1976, 44 y.o.   MRN: [id number]  HPI  Pt is here for follow up.  Pt s/p Megace.  Bled two days and now nothing (has been off 1 week).  Pt has 4 days of antibiotics left.  She is feeling better.  No discharge, abdominal pain and side pain is better.  Boyfriend is going to [place] to get tested / treated.    Review of Systems  Respiratory: Negative.   Cardiovascular: Negative.   Gastrointestinal: Positive for abdominal pain.  Genitourinary: Negative for menstrual problem, vaginal bleeding and vaginal discharge.  Musculoskeletal: Positive for myalgias.  Psychiatric/Behavioral: Negative.        Objective:   Physical Exam Vitals reviewed.  Constitutional:      General: She is not in acute distress.    Appearance: She is well-developed.  HENT:     Head: Normocephalic and atraumatic.  Eyes:     Conjunctiva/sclera: Conjunctivae normal.  Cardiovascular:     Rate and Rhythm: Normal rate.  Pulmonary:     Effort: Pulmonary effort is normal.  Abdominal:     General: Bowel sounds are normal. There is no distension.     Palpations: Abdomen is soft. There is no mass.     Tenderness: There is no abdominal tenderness. There is no guarding or rebound.  Genitourinary:    General: Normal vulva.     Comments: Tanner V Cervix--no cervical tenderness Uterus--small, non tender Right adnexa--mild pain with deep palpation Left adnexa--normal Skin:    General: Skin is warm and dry.  Neurological:     Mental Status: She is alert and oriented to person, place, and time.    Vitals:   01/07/20 1331  BP: 108/62  Pulse: 78  Resp: 16  Temp: 97.8 F (36.6 C)  Weight: 128 lb (58.1 kg)  Height: 5\' 1"  (1.549 m)      Assessment & Plan:  44 yo female with improving PID  1.  Right adnexal tenderness.  TOC in 3 weeks.  Boyfriend to go for testing/treatment 2.  Start OCPs with next menses 3.  F/U with PCP for increased CBGs  25 mins  spent face to face with >50% counseling.

## 2020-01-28 ENCOUNTER — Ambulatory Visit (INDEPENDENT_AMBULATORY_CARE_PROVIDER_SITE_OTHER): Payer: PRIVATE HEALTH INSURANCE | Admitting: Obstetrics & Gynecology

## 2020-01-28 ENCOUNTER — Other Ambulatory Visit: Payer: Self-pay

## 2020-01-28 ENCOUNTER — Encounter: Payer: Self-pay | Admitting: Obstetrics & Gynecology

## 2020-01-28 VITALS — BP 104/68 | HR 71 | Ht 61.0 in | Wt 130.0 lb

## 2020-01-28 DIAGNOSIS — N898 Other specified noninflammatory disorders of vagina: Secondary | ICD-10-CM | POA: Diagnosis not present

## 2020-01-28 DIAGNOSIS — N760 Acute vaginitis: Secondary | ICD-10-CM | POA: Diagnosis not present

## 2020-01-28 DIAGNOSIS — B9689 Other specified bacterial agents as the cause of diseases classified elsewhere: Secondary | ICD-10-CM | POA: Diagnosis not present

## 2020-01-28 DIAGNOSIS — Z113 Encounter for screening for infections with a predominantly sexual mode of transmission: Secondary | ICD-10-CM | POA: Diagnosis not present

## 2020-01-28 DIAGNOSIS — N730 Acute parametritis and pelvic cellulitis: Secondary | ICD-10-CM

## 2020-01-28 DIAGNOSIS — A749 Chlamydial infection, unspecified: Secondary | ICD-10-CM

## 2020-01-28 NOTE — Patient Instructions (Signed)
Return to clinic for any scheduled appointments or for any gynecologic concerns as needed.   

## 2020-01-28 NOTE — Progress Notes (Signed)
   GYNECOLOGY OFFICE VISIT NOTE  History:   Whitney Turner is a 44 y.o. [phone] here today for follow up and test of cure after recent diagnosis of chlamydia and PID.  Reports mild right sided pain, but overall feels much better.  Last period was a little irregular, but she feels this is improving too. She denies any current abnormal vaginal discharge, bleeding, pelvic pain or other concerns.    Past Medical History:  Diagnosis Date  . Allergy   . Heart murmur   . Herpes     Past Surgical History:  Procedure Laterality Date  . HERNIA REPAIR  1982  . KNEE SURGERY  08/2011  . TONSILECTOMY/ADENOIDECTOMY WITH MYRINGOTOMY  2000    The following portions of the patient's history were reviewed and updated as appropriate: allergies, current medications, past family history, past medical history, past social history, past surgical history and problem list.   Health Maintenance:  Normal pap and negative HRHPV on 07/02/2019.    Review of Systems:  Pertinent items noted in HPI and remainder of comprehensive ROS otherwise negative.  Physical Exam:  BP 104/68   Pulse 71   Ht 5\' 1"  (1.549 m)   Wt 130 lb (59 kg)   BMI 24.56 kg/m  CONSTITUTIONAL: Well-developed, well-nourished female in no acute distress.  NEUROLOGIC: Alert and oriented to person, place, and time. Normal muscle tone coordination. No cranial nerve deficit noted. PSYCHIATRIC: Normal mood and affect. Normal behavior. Normal judgment and thought content. CARDIOVASCULAR: Normal heart rate noted RESPIRATORY: Effort and breath sounds normal, no problems with respiration noted ABDOMEN: Nontender on palpation.  No masses noted. No other overt distention noted.  PELVIC: Normal appearing external genitalia; normal appearing distal vaginal mucosa.  No abnormal discharge noted.  Testing sample obtained.     Assessment and Plan:      1. Chlamydia 2. PID (acute pelvic inflammatory disease) Improving symptoms.  TOC done today, will follow  up results and manage accordingly. Will continue to monitor. Patient advised to contact with any concerning or worsening symptoms. - Cervicovaginal ancillary only( [facility] [place])  Return for any gynecologic concerns.    Total face-to-face time with patient: 10 minutes.  Over 50% of encounter was spent on counseling and coordination of care.   [place], MD, FACOG Obstetrician & Gynecologist, [facility] for [facility]

## 2020-01-29 LAB — CERVICOVAGINAL ANCILLARY ONLY
Bacterial Vaginitis (gardnerella): POSITIVE — AB
Candida Glabrata: NEGATIVE
Candida Vaginitis: NEGATIVE
Chlamydia: NEGATIVE
Comment: NEGATIVE
Comment: NEGATIVE
Comment: NEGATIVE
Comment: NEGATIVE
Comment: NEGATIVE
Comment: NORMAL
Neisseria Gonorrhea: NEGATIVE
Trichomonas: NEGATIVE

## 2020-01-29 MED ORDER — CLINDAMYCIN HCL 300 MG PO CAPS: 300.0000 mg | ORAL_CAPSULE | Freq: Two times a day (BID) | ORAL | 0 refills | Status: DC

## 2020-01-29 NOTE — Addendum Note (Signed)
Addended by: Jaynie Collins A on: 01/29/2020 04:10 PM   Modules accepted: Orders

## 2020-02-04 ENCOUNTER — Ambulatory Visit: Payer: PRIVATE HEALTH INSURANCE | Admitting: Obstetrics & Gynecology

## 2020-02-24 ENCOUNTER — Other Ambulatory Visit: Payer: Self-pay | Admitting: Sports Medicine

## 2020-02-24 DIAGNOSIS — M25461 Effusion, right knee: Secondary | ICD-10-CM

## 2020-03-12 ENCOUNTER — Telehealth: Payer: Self-pay | Admitting: Neurology

## 2020-03-12 NOTE — Telephone Encounter (Signed)
LMOM on pt's vm for her to return my call  to get more info on the knee brace that she wants.

## 2020-03-12 NOTE — Telephone Encounter (Signed)
Patient left a VM asking about how she could get a knee brace for her other knee like the one she received from Dr. Karie Schwalbe in the office. Can you follow up with her?

## 2020-03-17 NOTE — Telephone Encounter (Signed)
Looks like it was just a" reaction knee brace", have her let us know which size and she can do a nurse visit for it.

## 2020-03-24 NOTE — Telephone Encounter (Signed)
Irva,   Do you know if we have this in stock? If so, can we go ahead and set it aside so that we will have it for her NV?  Thanks!

## 2020-04-02 ENCOUNTER — Ambulatory Visit (INDEPENDENT_AMBULATORY_CARE_PROVIDER_SITE_OTHER): Payer: PRIVATE HEALTH INSURANCE | Admitting: Sports Medicine

## 2020-04-02 ENCOUNTER — Other Ambulatory Visit: Payer: Self-pay

## 2020-04-02 VITALS — BP 112/63 | HR 63 | Ht 61.0 in | Wt 130.0 lb

## 2020-04-02 DIAGNOSIS — M25562 Pain in left knee: Secondary | ICD-10-CM

## 2020-04-02 DIAGNOSIS — G8929 Other chronic pain: Secondary | ICD-10-CM

## 2020-04-02 NOTE — Progress Notes (Signed)
Patient comes in for knee brace for left knee. Received reaction knee brace for right knee in August 2020 which has worked well for her. She is having pain in left knee and requesting another brace. XS/S Brace given. Irena Cords forms completed. She will call with any issues.

## 2020-07-02 ENCOUNTER — Other Ambulatory Visit: Payer: Self-pay | Admitting: Sports Medicine

## 2020-07-02 DIAGNOSIS — M25461 Effusion, right knee: Secondary | ICD-10-CM

## 2020-10-22 ENCOUNTER — Telehealth: Payer: Self-pay | Admitting: *Deleted

## 2020-10-22 MED ORDER — NORGESTIMATE-ETH ESTRADIOL 0.25-35 MG-MCG PO TABS: 1.0000 | ORAL_TABLET | Freq: Every day | ORAL | 0 refills | Status: DC

## 2020-10-22 NOTE — Telephone Encounter (Signed)
Pt called requesting a RF on OCP.  She is overdue for her annual so appt made for 10/30/20 and 1 RF sent to her pharmacy.

## 2020-10-30 ENCOUNTER — Ambulatory Visit (INDEPENDENT_AMBULATORY_CARE_PROVIDER_SITE_OTHER): Payer: PRIVATE HEALTH INSURANCE | Admitting: Obstetrics and Gynecology

## 2020-10-30 ENCOUNTER — Encounter: Payer: Self-pay | Admitting: Obstetrics and Gynecology

## 2020-10-30 ENCOUNTER — Other Ambulatory Visit: Payer: Self-pay

## 2020-10-30 VITALS — BP 91/59 | HR 89 | Resp 16 | Ht 61.0 in | Wt 123.0 lb

## 2020-10-30 DIAGNOSIS — Z01419 Encounter for gynecological examination (general) (routine) without abnormal findings: Secondary | ICD-10-CM | POA: Diagnosis not present

## 2020-10-30 DIAGNOSIS — Z1231 Encounter for screening mammogram for malignant neoplasm of breast: Secondary | ICD-10-CM

## 2020-10-30 NOTE — Progress Notes (Signed)
GYNECOLOGY ANNUAL PREVENTATIVE CARE ENCOUNTER NOTE  Subjective:   Whitney Turner is a 44 y.o. [phone] female here for a annual gynecologic exam. Current complaints: none. Takes probiotics for h/o vaginal infection  Denies abnormal vaginal bleeding, discharge, pelvic pain, problems with intercourse or other gynecologic concerns. Declines STI screen.   Gynecologic History Patient's last menstrual period was 10/20/2020. Contraception: OCP (estrogen/progesterone) Last Pap: 2020. Results: normal Last mammogram: never had DEXA: has never had  Obstetric History OB History  Gravida Para Term Preterm AB Living  5 3 3   2 3   SAB IAB Ectopic Multiple Live Births  2            # Outcome Date GA Lbr Len/2nd Weight Sex Delivery Anes PTL Lv  5 SAB           4 SAB           3 Term           2 Term           1 Term             Past Medical History:  Diagnosis Date  . Allergy   . Heart murmur   . Herpes     Past Surgical History:  Procedure Laterality Date  . HERNIA REPAIR  1982  . KNEE SURGERY  08/2011  . TONSILECTOMY/ADENOIDECTOMY WITH MYRINGOTOMY  2000    Current Outpatient Medications on File Prior to Visit  Medication Sig Dispense Refill  . cetirizine (ZYRTEC) 10 MG tablet Take 10 mg by mouth daily.    . norgestimate-ethinyl estradiol (ORTHO-CYCLEN) 0.25-35 MG-MCG tablet Take 1 tablet by mouth daily. (Patient not taking: Reported on 10/30/2020) 28 tablet 0   No current facility-administered medications on file prior to visit.    No Known Allergies  Social History   Socioeconomic History  . Marital status: Married    Spouse name: Not on file  . Number of children: Not on file  . Years of education: Not on file  . Highest education level: Not on file  Occupational History  . Not on file  Tobacco Use  . Smoking status: Never Smoker  . Smokeless tobacco: Never Used  Vaping Use  . Vaping Use: Never used  Substance and Sexual Activity  . Alcohol use: Yes     Comment: Socially  . Drug use: Never  . Sexual activity: Yes    Birth control/protection: Condom, Pill  Other Topics Concern  . Not on file  Social History Narrative  . Not on file   Social Determinants of Health   Financial Resource Strain: Not on file  Food Insecurity: Not on file  Transportation Needs: Not on file  Physical Activity: Not on file  Stress: Not on file  Social Connections: Not on file  Intimate Partner Violence: Not on file    Family History  Problem Relation Age of Onset  . Hypertension Father   . Hyperlipidemia Father   . Cancer Maternal Grandmother        breast cancer  . Cancer Maternal Grandfather        lung cancer  . Alzheimer's disease Paternal Grandmother   . Cancer Paternal Grandfather        lung cancer   The following portions of the patient's history were reviewed and updated as appropriate: allergies, current medications, past family history, past medical history, past social history, past surgical history and problem list.  Review of Systems Pertinent items are  noted in HPI.   Objective:  BP (!) 91/59   Pulse 89   Resp 16   Ht 5\' 1"  (1.549 m)   Wt 123 lb (55.8 kg)   LMP 10/20/2020   BMI 23.24 kg/m  CONSTITUTIONAL: Well-developed, well-nourished female in no acute distress.  HENT:  Normocephalic, atraumatic, External right and left ear normal. Oropharynx is clear and moist EYES: Conjunctivae and EOM are normal. Pupils are equal, round, and reactive to light. No scleral icterus.  NECK: Normal range of motion, supple, no masses.  Normal thyroid.  SKIN: Skin is warm and dry. No rash noted. Not diaphoretic. No erythema. No pallor. NEUROLOGIC: Alert and oriented to person, place, and time. Normal reflexes, muscle tone coordination. No cranial nerve deficit noted. PSYCHIATRIC: Normal mood and affect. Normal behavior. Normal judgment and thought content. CARDIOVASCULAR: Normal heart rate noted RESPIRATORY: Effort normal, no problems with  respiration noted. BREASTS: Symmetric in size. No masses, skin changes, nipple drainage, or lymphadenopathy. ABDOMEN: Soft, no distention noted.  No tenderness, rebound or guarding.  PELVIC: Normal appearing external genitalia; normal appearing vaginal mucosa and cervix.  No abnormal discharge noted.  Normal uterine size, no other palpable masses, no uterine or adnexal tenderness. MUSCULOSKELETAL: Normal range of motion. No tenderness.  No cyanosis, clubbing, or edema.  2+ distal pulses.  Exam done with chaperone present.   Assessment and Plan:   1. Well woman exam Healthy female exam Refill previously sent for OCPs  2. Encounter for screening mammogram for malignant neoplasm of breast - MM 3D SCREEN BREAST BILATERAL; Future   Encouraged improvement in diet and exercise.  COVID vaccine UTD Declines STI screen. Mammogram ordreed today Referral for colonoscopy n/a Flu vaccine declined DEXA not due based on age  Routine preventative health maintenance measures emphasized. Please refer to After Visit Summary for other counseling recommendations.   Total face-to-face time with patient: 22 minutes. Over 50% of encounter was spent on counseling and coordination of care.   10/22/2020, M.D. Attending Center for [facility] [employer])

## 2020-11-13 ENCOUNTER — Other Ambulatory Visit: Payer: Self-pay | Admitting: Obstetrics and Gynecology

## 2020-11-17 ENCOUNTER — Ambulatory Visit (INDEPENDENT_AMBULATORY_CARE_PROVIDER_SITE_OTHER): Payer: PRIVATE HEALTH INSURANCE | Admitting: Sports Medicine

## 2020-11-17 DIAGNOSIS — M7542 Impingement syndrome of left shoulder: Secondary | ICD-10-CM

## 2020-11-17 DIAGNOSIS — G8929 Other chronic pain: Secondary | ICD-10-CM | POA: Insufficient documentation

## 2020-11-17 MED ORDER — MELOXICAM 15 MG PO TABS: ORAL_TABLET | ORAL | 3 refills | Status: DC

## 2020-11-17 NOTE — Assessment & Plan Note (Signed)
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 She has been having significant pain over her deltoid, worse with abduction. No discrete injuries. On exam she has only minimally positive Hawkins and Neer signs, a mildly weak/positive Jobe's test. No labral or bicipital signs. We discussed the force coupling function of the rotator cuff as well as the anatomy, adding x-rays, home rehab exercises with Thera-Band, meloxicam to use as needed, return to see me in 6 weeks, subacromial injection if no better.

## 2020-11-17 NOTE — Progress Notes (Signed)
    Procedures performed today:    None.  Independent interpretation of notes and tests performed by another provider:   None.  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 She has been having significant pain over her deltoid, worse with abduction. No discrete injuries. On exam she has only minimally positive Hawkins and Neer signs, a mildly weak/positive Jobe's test. No labral or bicipital signs. We discussed the force coupling function of the rotator cuff as well as the anatomy, adding x-rays, home rehab exercises with Thera-Band, meloxicam to use as needed, return to see me in 6 weeks, subacromial injection if no better.    ___________________________________________ [name]. Benjamin Stain, M.D., ABFM., CAQSM. Primary Care and Sports Medicine [place] [facility]  Adjunct Instructor of Family Medicine  [employer]

## 2020-12-02 ENCOUNTER — Other Ambulatory Visit: Payer: Self-pay | Admitting: Obstetrics and Gynecology

## 2020-12-26 ENCOUNTER — Ambulatory Visit: Payer: PRIVATE HEALTH INSURANCE

## 2020-12-26 ENCOUNTER — Other Ambulatory Visit: Payer: Self-pay

## 2020-12-26 ENCOUNTER — Other Ambulatory Visit (HOSPITAL_COMMUNITY)
Admission: RE | Admit: 2020-12-26 | Discharge: 2020-12-26 | Disposition: A | Discharge status: Home / Self Care | Payer: PRIVATE HEALTH INSURANCE | Source: Ambulatory Visit | Attending: Obstetrics and Gynecology | Admitting: Obstetrics and Gynecology

## 2020-12-26 DIAGNOSIS — Z113 Encounter for screening for infections with a predominantly sexual mode of transmission: Secondary | ICD-10-CM

## 2020-12-26 NOTE — Progress Notes (Signed)
Pt here for STI testing. Self swab done. Pt sent to lab for blood work.

## 2020-12-28 LAB — CERVICOVAGINAL ANCILLARY ONLY
Chlamydia: NEGATIVE
Comment: NEGATIVE
Comment: NEGATIVE
Comment: NORMAL
Neisseria Gonorrhea: NEGATIVE
Trichomonas: POSITIVE — AB

## 2020-12-29 ENCOUNTER — Other Ambulatory Visit: Payer: Self-pay | Admitting: Obstetrics and Gynecology

## 2020-12-29 ENCOUNTER — Telehealth: Payer: Self-pay

## 2020-12-29 LAB — HEPATITIS B SURFACE ANTIGEN: Hepatitis B Surface Ag: NONREACTIVE

## 2020-12-29 LAB — RPR: RPR Ser Ql: NONREACTIVE

## 2020-12-29 LAB — HEPATITIS C ANTIBODY
Hepatitis C Ab: NONREACTIVE
SIGNAL TO CUT-OFF: 0.04 (ref <1.00)

## 2020-12-29 LAB — HIV ANTIBODY (ROUTINE TESTING W REFLEX): HIV 1&2 Ab, 4th Generation: NONREACTIVE

## 2020-12-29 MED ORDER — METRONIDAZOLE 500 MG PO TABS: ORAL_TABLET | ORAL | 0 refills | Status: DC

## 2020-12-29 NOTE — Telephone Encounter (Addendum)
Spoke with pt and she is aware of positive Trich and that Rx has been sent to pharmacy. Pt is also aware that her partner needs to go to his care provider to be tested and treated.   ----- Message from Conan Bowens, MD sent at 12/29/2020  8:39 AM EST ----- Trich positive, Rx sent to pharmacy Please call and let her know that partner needs to be treated as well

## 2020-12-30 ENCOUNTER — Other Ambulatory Visit: Payer: Self-pay

## 2020-12-30 ENCOUNTER — Ambulatory Visit (INDEPENDENT_AMBULATORY_CARE_PROVIDER_SITE_OTHER): Payer: PRIVATE HEALTH INSURANCE | Admitting: Physician Assistant

## 2020-12-30 ENCOUNTER — Encounter: Payer: Self-pay | Admitting: Physician Assistant

## 2020-12-30 VITALS — BP 113/59 | HR 64 | Wt 126.1 lb

## 2020-12-30 DIAGNOSIS — Z1231 Encounter for screening mammogram for malignant neoplasm of breast: Secondary | ICD-10-CM | POA: Diagnosis not present

## 2020-12-30 DIAGNOSIS — Z0000 Encounter for general adult medical examination without abnormal findings: Secondary | ICD-10-CM | POA: Diagnosis not present

## 2020-12-30 DIAGNOSIS — Z13220 Encounter for screening for lipoid disorders: Secondary | ICD-10-CM

## 2020-12-30 DIAGNOSIS — F909 Attention-deficit hyperactivity disorder, unspecified type: Secondary | ICD-10-CM

## 2020-12-30 DIAGNOSIS — Z131 Encounter for screening for diabetes mellitus: Secondary | ICD-10-CM

## 2020-12-30 DIAGNOSIS — R41840 Attention and concentration deficit: Secondary | ICD-10-CM | POA: Insufficient documentation

## 2020-12-30 NOTE — Patient Instructions (Signed)
Health Maintenance, Female Adopting a healthy lifestyle and getting preventive care are important in promoting health and wellness. Ask your health care provider about:  The right schedule for you to have regular tests and exams.  Things you can do on your own to prevent diseases and keep yourself healthy. What should I know about diet, weight, and exercise? Eat a healthy diet  Eat a diet that includes plenty of vegetables, fruits, low-fat dairy products, and lean protein.  Do not eat a lot of foods that are high in solid fats, added sugars, or sodium.   Maintain a healthy weight Body mass index (BMI) is used to identify weight problems. It estimates body fat based on height and weight. Your health care provider can help determine your BMI and help you achieve or maintain a healthy weight. Get regular exercise Get regular exercise. This is one of the most important things you can do for your health. Most adults should:  Exercise for at least 150 minutes each week. The exercise should increase your heart rate and make you sweat (moderate-intensity exercise).  Do strengthening exercises at least twice a week. This is in addition to the moderate-intensity exercise.  Spend less time sitting. Even light physical activity can be beneficial. Watch cholesterol and blood lipids Have your blood tested for lipids and cholesterol at 43 years of age, then have this test every 5 years. Have your cholesterol levels checked more often if:  Your lipid or cholesterol levels are high.  You are older than 43 years of age.  You are at high risk for heart disease. What should I know about cancer screening? Depending on your health history and family history, you may need to have cancer screening at various ages. This may include screening for:  Breast cancer.  Cervical cancer.  Colorectal cancer.  Skin cancer.  Lung cancer. What should I know about heart disease, diabetes, and high blood  pressure? Blood pressure and heart disease  High blood pressure causes heart disease and increases the risk of stroke. This is more likely to develop in people who have high blood pressure readings, are of African descent, or are overweight.  Have your blood pressure checked: ? Every 3-5 years if you are 18-39 years of age. ? Every year if you are 40 years old or older. Diabetes Have regular diabetes screenings. This checks your fasting blood sugar level. Have the screening done:  Once every three years after age 40 if you are at a normal weight and have a low risk for diabetes.  More often and at a younger age if you are overweight or have a high risk for diabetes. What should I know about preventing infection? Hepatitis B If you have a higher risk for hepatitis B, you should be screened for this virus. Talk with your health care provider to find out if you are at risk for hepatitis B infection. Hepatitis C Testing is recommended for:  Everyone born from 1945 through 1965.  Anyone with known risk factors for hepatitis C. Sexually transmitted infections (STIs)  Get screened for STIs, including gonorrhea and chlamydia, if: ? You are sexually active and are younger than 43 years of age. ? You are older than 43 years of age and your health care provider tells you that you are at risk for this type of infection. ? Your sexual activity has changed since you were last screened, and you are at increased risk for chlamydia or gonorrhea. Ask your health care provider   if you are at risk.  Ask your health care provider about whether you are at high risk for HIV. Your health care provider may recommend a prescription medicine to help prevent HIV infection. If you choose to take medicine to prevent HIV, you should first get tested for HIV. You should then be tested every 3 months for as long as you are taking the medicine. Pregnancy  If you are about to stop having your period (premenopausal) and  you may become pregnant, seek counseling before you get pregnant.  Take 400 to 800 micrograms (mcg) of folic acid every day if you become pregnant.  Ask for birth control (contraception) if you want to prevent pregnancy. Osteoporosis and menopause Osteoporosis is a disease in which the bones lose minerals and strength with aging. This can result in bone fractures. If you are 65 years old or older, or if you are at risk for osteoporosis and fractures, ask your health care provider if you should:  Be screened for bone loss.  Take a calcium or vitamin D supplement to lower your risk of fractures.  Be given hormone replacement therapy (HRT) to treat symptoms of menopause. Follow these instructions at home: Lifestyle  Do not use any products that contain nicotine or tobacco, such as cigarettes, e-cigarettes, and chewing tobacco. If you need help quitting, ask your health care provider.  Do not use street drugs.  Do not share needles.  Ask your health care provider for help if you need support or information about quitting drugs. Alcohol use  Do not drink alcohol if: ? Your health care provider tells you not to drink. ? You are pregnant, may be pregnant, or are planning to become pregnant.  If you drink alcohol: ? Limit how much you use to 0-1 drink a day. ? Limit intake if you are breastfeeding.  Be aware of how much alcohol is in your drink. In the [place] one drink equals one 12 oz bottle of beer (355 mL), one 5 oz glass of wine (148 mL), or one 1 oz glass of hard liquor (44 mL). General instructions  Schedule regular health, dental, and eye exams.  Stay current with your vaccines.  Tell your health care provider if: ? You often feel depressed. ? You have ever been abused or do not feel safe at home. Summary  Adopting a healthy lifestyle and getting preventive care are important in promoting health and wellness.  Follow your health care provider's instructions about healthy  diet, exercising, and getting tested or screened for diseases.  Follow your health care provider's instructions on monitoring your cholesterol and blood pressure. This information is not intended to replace advice given to you by your health care provider. Make sure you discuss any questions you have with your health care provider. Document Revised: 11/01/2018 Document Reviewed: 11/01/2018 Elsevier Patient Education  2021 Elsevier Inc.  

## 2020-12-30 NOTE — Progress Notes (Signed)
Subjective:     Whitney Turner is a 45 y.o. female and is here for a comprehensive physical exam. The patient reports problems - she has had people in her life tell her she has ADHD symptoms and should be evaluated. she admits to frequently interrupting and poor concentration but it has never bothered her.  she feels like it is bother people in her life.   Social History   Socioeconomic History  . Marital status: Married    Spouse name: Not on file  . Number of children: Not on file  . Years of education: Not on file  . Highest education level: Not on file  Occupational History  . Not on file  Tobacco Use  . Smoking status: Never Smoker  . Smokeless tobacco: Never Used  Vaping Use  . Vaping Use: Never used  Substance and Sexual Activity  . Alcohol use: Yes    Comment: Socially  . Drug use: Never  . Sexual activity: Yes    Birth control/protection: Condom, Pill  Other Topics Concern  . Not on file  Social History Narrative  . Not on file   Social Determinants of Health   Financial Resource Strain: Not on file  Food Insecurity: Not on file  Transportation Needs: Not on file  Physical Activity: Not on file  Stress: Not on file  Social Connections: Not on file  Intimate Partner Violence: Not on file   Health Maintenance  Topic Date Due  . MAMMOGRAM  Never done  . INFLUENZA VACCINE  02/19/2021 (Originally 06/22/2020)  . COVID-19 Vaccine (3 - Booster for Pfizer series) 02/11/2021  . PAP SMEAR-Modifier  07/01/2022  . TETANUS/TDAP  12/01/2022  . Hepatitis C Screening  Completed  . HIV Screening  Completed    The following portions of the patient's history were reviewed and updated as appropriate: allergies, current medications, past family history, past medical history, past social history, past surgical history and problem list.  Review of Systems A comprehensive review of systems was negative.   Objective:    BP (!) 113/59   Pulse 64   Wt 126 lb 1.3 oz (57.2 kg)    SpO2 100%   BMI 23.82 kg/m  General appearance: alert, cooperative and appears stated age Head: Normocephalic, without obvious abnormality, atraumatic Eyes: conjunctivae/corneas clear. PERRL, EOM's intact. Fundi benign. Ears: normal TM's and external ear canals both ears Nose: Nares normal. Septum midline. Mucosa normal. No drainage or sinus tenderness. Throat: lips, mucosa, and tongue normal; teeth and gums normal Neck: no adenopathy, no carotid bruit, no JVD, supple, symmetrical, trachea midline and thyroid not enlarged, symmetric, no tenderness/mass/nodules Back: symmetric, no curvature. ROM normal. No CVA tenderness. Lungs: clear to auscultation bilaterally Heart: regular rate and rhythm, S1, S2 normal, no murmur, click, rub or gallop Abdomen: soft, non-tender; bowel sounds normal; no masses,  no organomegaly Extremities: extremities normal, atraumatic, no cyanosis or edema Pulses: 2+ and symmetric Skin: Skin color, texture, turgor normal. No rashes or lesions Lymph nodes: Cervical, supraclavicular, and axillary nodes normal. Neurologic: Alert and oriented X 3, normal strength and tone. Normal symmetric reflexes. Normal coordination and gait    .Marland Kitchen Adult ADHD Self Report Scale (most recent)    Adult ADHD Self-Report Scale (ASRS-v1.1) Symptom Checklist - 12/30/20 1100      Part A   1. How often do you have trouble wrapping up the final details of a project, once the challenging parts have been done? Never  2. How often do you have  difficulty getting things done in order when you have to do a task that requires organization? Rarely    3. How often do you have problems remembering appointments or obligations? Never  4. When you have a task that requires a lot of thought, how often do you avoid or delay getting started? Never    5. How often do you fidget or squirm with your hands or feet when you have to sit down for a long time? Often  6. How often do you feel overly active and compelled  to do things, like you were driven by a motor? Sometimes      Part B   7. How often do you make careless mistakes when you have to work on a boring or difficult project? Sometimes  8. How often do you have difficulty keeping your attention when you are doing boring or repetitive work? Often    9. How often do you have difficulty concentrating on what people say to you, even when they are speaking to you directly? Rarely  10. How often do you misplace or have difficulty finding things at home or at work? Never    11. How often are you distracted by activity or noise around you? Often  12. How often do you leave your seat in meetings or other situations in which you are expected to remain seated? Never    13. How often do you feel restless or fidgety? Rarely  14. How often do you have difficulty unwinding and relaxing when you have time to yourself? Sometimes    15. How often do you find yourself talking too much when you are in social situations? Often  16. When you are in a conversation, how often do you find yourself finishing the sentences of the people you are talking to, before they can finish them themselves? Often    17. How often do you have difficulty waiting your turn in situations when turn taking is required? Never  18. How often do you interrupt others when they are busy? Sometimes          .Marland Kitchen Depression screen [facility] 2/9 12/30/2020 12/21/2019 12/04/2018 07/14/2017  Decreased Interest 0 0 0 0  Down, Depressed, Hopeless 0 0 0 0  PHQ - 2 Score 0 0 0 0  Altered sleeping 0 0 - -  Tired, decreased energy 0 0 - -  Change in appetite 0 0 - -  Feeling bad or failure about yourself  0 0 - -  Trouble concentrating 0 0 - -  Moving slowly or fidgety/restless 0 0 - -  Suicidal thoughts 0 0 - -  PHQ-9 Score 0 0 - -  Difficult doing work/chores Not difficult at all Not difficult at all - -    Assessment:    Healthy female exam.      Plan:    Marland KitchenMarland KitchenDiagnoses and all orders for this  visit:  Routine physical examination -     Lipid Panel w/reflex Direct LDL -     COMPLETE METABOLIC PANEL WITH GFR -     TSH  Screening for diabetes mellitus -     COMPLETE METABOLIC PANEL WITH GFR  Screening for lipid disorders -     Lipid Panel w/reflex Direct LDL  Visit for screening mammogram -     MM 3D SCREEN BREAST BILATERAL  Hyperactive behavior -     Ambulatory referral to Psychology  Poor concentration -     Ambulatory referral to Psychology   .Marland Kitchen  Discussed 150 minutes of exercise a week.  Encouraged vitamin D 1000 units and Calcium 1300mg  or 4 servings of dairy a day.  Fasting labs ordered.  Pap up to date.  Mammogram ordered.  covid vaccine done.  Declined flu.   Mood great.   ADHD screen not overwhelming positive but patient has 2 sons dx with it and she has had so many people tell her it "changes your life" when on medication and need to be.   See After Visit Summary for Counseling Recommendations

## 2020-12-31 ENCOUNTER — Encounter: Payer: PRIVATE HEALTH INSURANCE | Admitting: Physician Assistant

## 2020-12-31 ENCOUNTER — Ambulatory Visit (INDEPENDENT_AMBULATORY_CARE_PROVIDER_SITE_OTHER): Payer: PRIVATE HEALTH INSURANCE

## 2020-12-31 DIAGNOSIS — Z1231 Encounter for screening mammogram for malignant neoplasm of breast: Secondary | ICD-10-CM | POA: Diagnosis not present

## 2021-02-23 ENCOUNTER — Ambulatory Visit: Payer: PRIVATE HEALTH INSURANCE | Admitting: Sports Medicine

## 2021-02-24 ENCOUNTER — Ambulatory Visit (INDEPENDENT_AMBULATORY_CARE_PROVIDER_SITE_OTHER): Payer: PRIVATE HEALTH INSURANCE

## 2021-02-24 ENCOUNTER — Ambulatory Visit (INDEPENDENT_AMBULATORY_CARE_PROVIDER_SITE_OTHER): Payer: PRIVATE HEALTH INSURANCE | Admitting: Sports Medicine

## 2021-02-24 ENCOUNTER — Other Ambulatory Visit: Payer: Self-pay

## 2021-02-24 DIAGNOSIS — M7542 Impingement syndrome of left shoulder: Secondary | ICD-10-CM | POA: Diagnosis not present

## 2021-02-24 NOTE — Progress Notes (Signed)
    Procedures performed today:    Procedure: Real-time Ultrasound Guided injection of the left subacromial bursa Device: Samsung HS60  Verbal informed consent obtained.  Time-out conducted.  Noted no overlying erythema, induration, or other signs of local infection.  Skin prepped in a sterile fashion.  Local anesthesia: Topical Ethyl chloride.  With sterile technique and under real time ultrasound guidance:  Noted intact rotator cuff, 1 cc kenalog 40, 1 cc lidocaine, 1 cc bupivacaine injected easily to the subacromial bursa taking care to avoid intratendinous injection.   Completed without difficulty  Advised to call if fevers/chills, erythema, induration, drainage, or persistent bleeding.  Images permanently stored and available for review in PACS.  Impression: Technically successful ultrasound guided injection.  Independent interpretation of notes and tests performed by another provider:   None.  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 Minimally positive Hawkins, Neer signs, minimally weak/positive Jobe's test on exam, no labral or bicipital signs, negative crossarm sign. Due to failure of conservative treatment we did a subacromial injection today, return to see me in 4 weeks to reevaluate. If insufficient improvement we will consider MRI for surgical planning.    ___________________________________________ [name]. Benjamin Stain, M.D., ABFM., CAQSM. Primary Care and Sports Medicine [facility]  Adjunct Instructor of Family Medicine  [employer]

## 2021-02-24 NOTE — Assessment & Plan Note (Addendum)
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 Minimally positive Hawkins, Neer signs, minimally weak/positive Jobe's test on exam, no labral or bicipital signs, negative crossarm sign. Due to failure of conservative treatment we did a subacromial injection today, return to see me in 4 weeks to reevaluate. If insufficient improvement we will consider MRI for surgical planning.

## 2021-03-24 ENCOUNTER — Ambulatory Visit: Payer: PRIVATE HEALTH INSURANCE | Admitting: Sports Medicine

## 2021-03-30 ENCOUNTER — Other Ambulatory Visit: Payer: Self-pay | Admitting: Sports Medicine

## 2021-03-30 DIAGNOSIS — M7542 Impingement syndrome of left shoulder: Secondary | ICD-10-CM

## 2021-04-27 IMAGING — DX DG SHOULDER 2+V*L*
3 series · 3 of 3 positions shown · non-contrast
Comparison: None.

CLINICAL DATA: Chronic pain

EXAM:
LEFT SHOULDER - 2+ VIEW

[shoulder grashey]
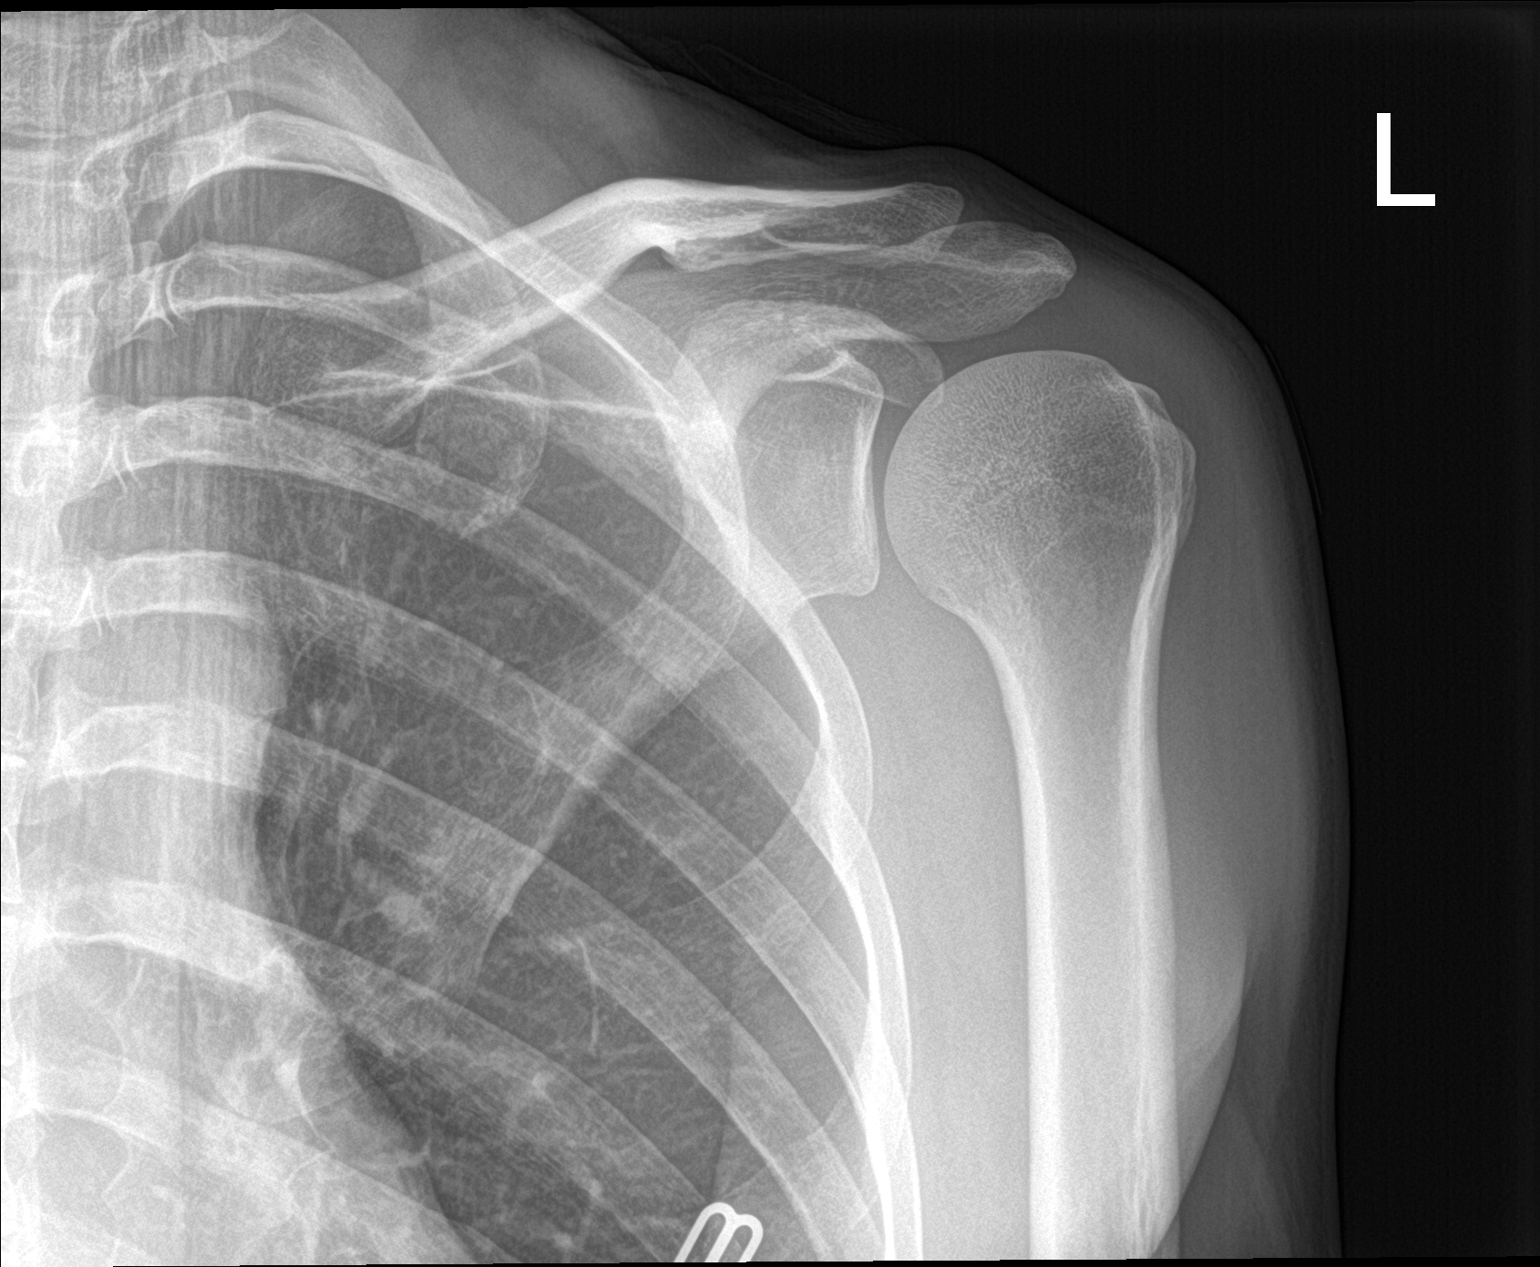

[shoulder y view]
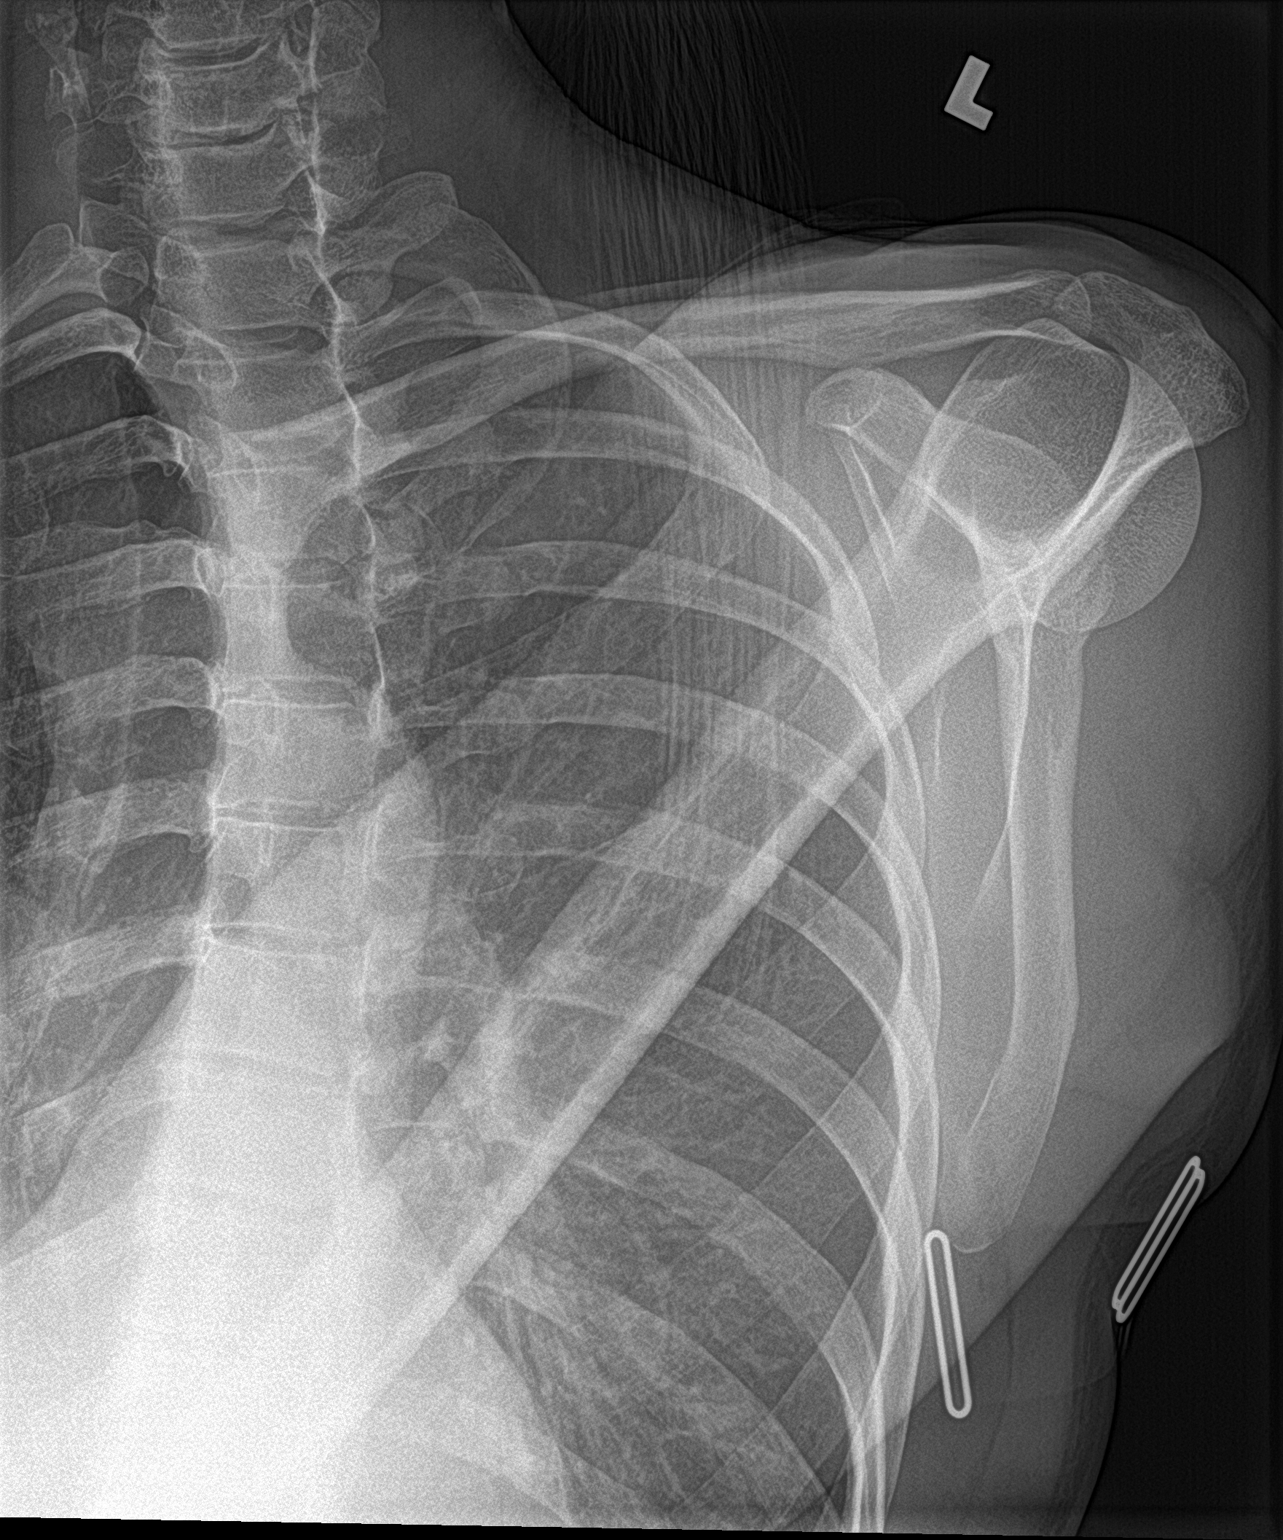

[shoulder axillary]
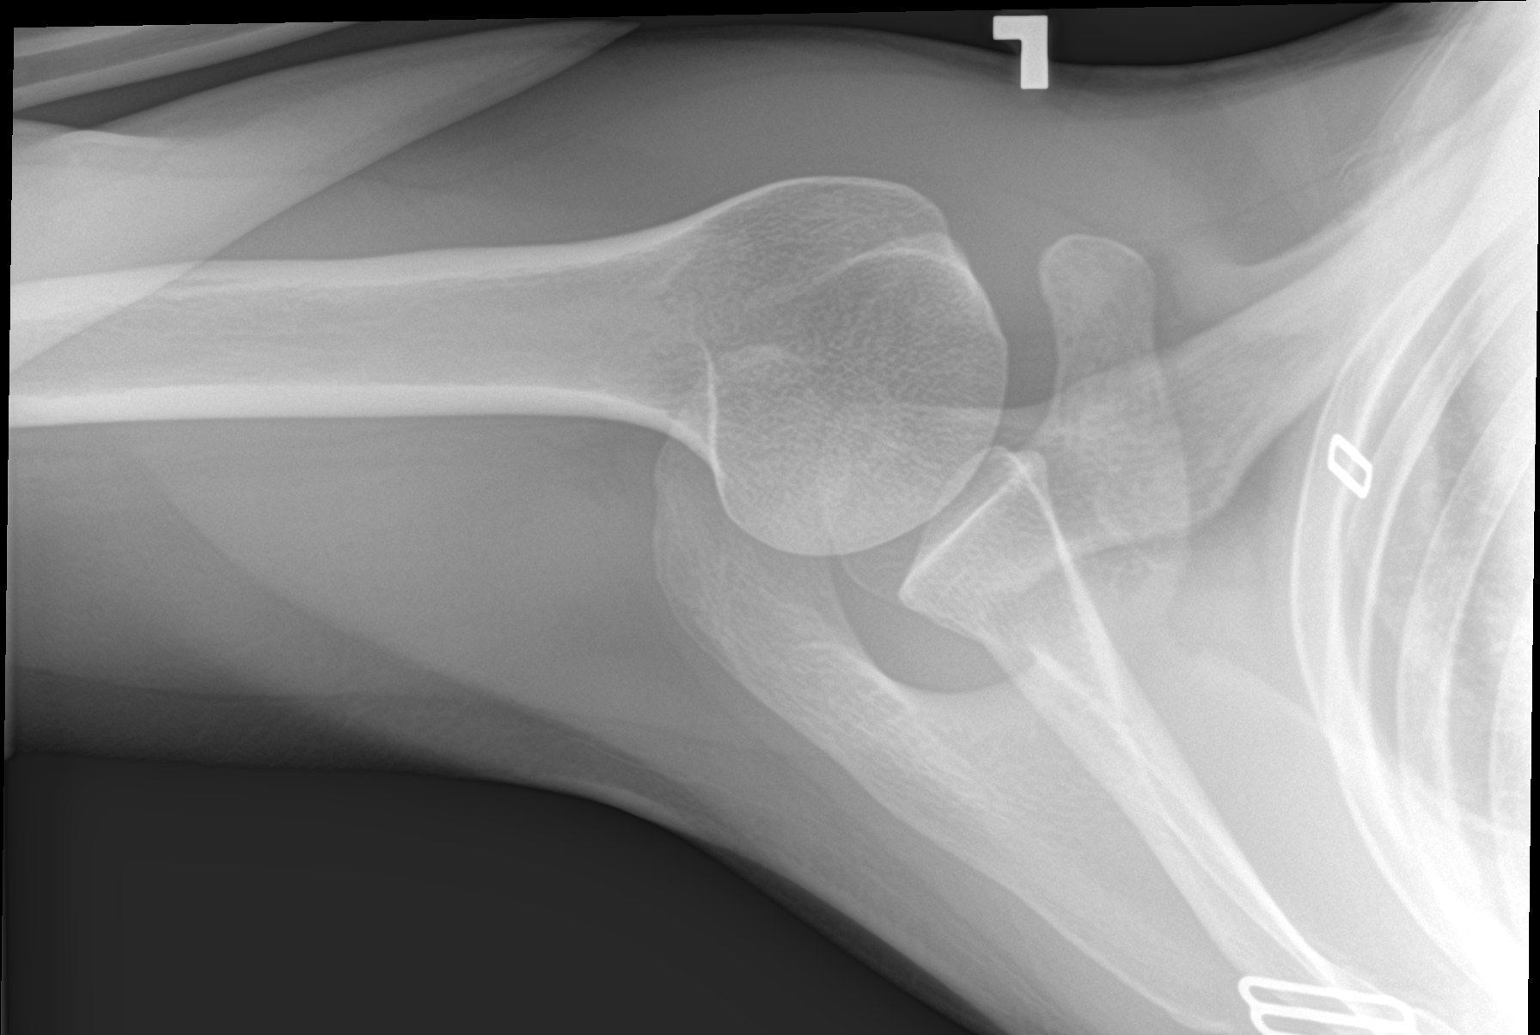

[3 of 3 positions shown; findings below may reference images not displayed]

FINDINGS: Oblique, Y scapular, axillary images were obtained. No fracture or
dislocation. Joint spaces appear normal. No erosive change.
Visualized left lung clear.
IMPRESSION: No fracture or dislocation.  No evident arthropathy.

## 2021-07-25 ENCOUNTER — Other Ambulatory Visit: Payer: Self-pay | Admitting: Sports Medicine

## 2021-07-25 DIAGNOSIS — M7542 Impingement syndrome of left shoulder: Secondary | ICD-10-CM

## 2021-11-03 NOTE — Progress Notes (Signed)
GYNECOLOGY ANNUAL PREVENTATIVE CARE ENCOUNTER NOTE  History:     Whitney Turner is a 45 y.o. [phone] female here for a routine annual gynecologic exam.  Current complaints: None. She would like STI screening. She would like to resume her birth control.      Denies abnormal vaginal bleeding, discharge, pelvic pain, problems with intercourse or other gynecologic concerns.     Gynecologic History Patient's last menstrual period was 11/03/2021. Contraception: OCP (estrogen/progesterone) Last Pap: 06/2019. Result was normal with normal HPV Last Mammogram: 12/2020.  Result was normal   Obstetric History OB History  Gravida Para Term Preterm AB Living  5 3 3   2 3   SAB IAB Ectopic Multiple Live Births  2            # Outcome Date GA Lbr Len/2nd Weight Sex Delivery Anes PTL Lv  5 SAB           4 SAB           3 Term           2 Term           1 Term             Past Medical History:  Diagnosis Date   Allergy    Heart murmur    Herpes     Past Surgical History:  Procedure Laterality Date   HERNIA REPAIR  1982   KNEE SURGERY  08/2011   TONSILECTOMY/ADENOIDECTOMY WITH MYRINGOTOMY  2000    Current Outpatient Medications on File Prior to Visit  Medication Sig Dispense Refill   cetirizine (ZYRTEC) 10 MG tablet Take 10 mg by mouth daily.     meloxicam (MOBIC) 15 MG tablet ONE TAB BY MOUTH EVEY AM WITH A MEAL FOR 2 WEEKS, THEN DAILY AS NEEDED PAIN. 30 tablet 3   norgestimate-ethinyl estradiol (ORTHO-CYCLEN) 0.25-35 MG-MCG tablet TAKE 1 TABLET BY MOUTH EVERY DAY (Patient not taking: Reported on 11/05/2021) 28 tablet 11   No current facility-administered medications on file prior to visit.    No Known Allergies  Social History:  reports that she has never smoked. She has never used smokeless tobacco. She reports current alcohol use. She reports that she does not use drugs.  Family History  Problem Relation Age of Onset   Hypertension Father    Hyperlipidemia Father     Cancer Maternal Grandmother        breast cancer   Cancer Maternal Grandfather        lung cancer   Alzheimer's disease Paternal Grandmother    Cancer Paternal Grandfather        lung cancer    The following portions of the patient's history were reviewed and updated as appropriate: allergies, current medications, past family history, past medical history, past social history, past surgical history and problem list.  Review of Systems Pertinent items noted in HPI and remainder of comprehensive ROS otherwise negative.  Physical Exam:  BP 95/61    Pulse 63    Resp 16    Ht 5\' 1"  (1.549 m)    Wt 120 lb (54.4 kg)    LMP 11/03/2021    BMI 22.67 kg/m  CONSTITUTIONAL: Well-developed, well-nourished female in no acute distress.  HENT:  Normocephalic, atraumatic, External right and left ear normal.  EYES: Conjunctivae and EOM are normal. Pupils are equal, round, and reactive to light. No scleral icterus.  NECK: Normal range of motion, supple, no masses.  Normal thyroid.  SKIN: Skin is warm and dry. No rash noted. Not diaphoretic. No erythema. No pallor. MUSCULOSKELETAL: Normal range of motion. No tenderness.  No cyanosis, clubbing, or edema. NEUROLOGIC: Alert and oriented to person, place, and time. Normal reflexes, muscle tone coordination.  PSYCHIATRIC: Normal mood and affect. Normal behavior. Normal judgment and thought content.  CARDIOVASCULAR: Normal heart rate noted, regular rhythm RESPIRATORY: Clear to auscultation bilaterally. Effort and breath sounds normal, no problems with respiration noted.  BREASTS: Symmetric in size. No masses, tenderness, skin changes, nipple drainage, or lymphadenopathy bilaterally. Performed in the presence of a chaperone. ABDOMEN: Soft, no distention noted.  No tenderness, rebound or guarding.  PELVIC: External genitalia normal, Vagina normal without discharge, Urethra without abnormality or discharge, no bladder tenderness, cervix normal in appearance, no  CMT, uterus normal size, shape, and consistency, no adnexal masses or tenderness. Performed in the presence of a chaperone.  Assessment and Plan:    1. Encounter for annual routine gynecological examination - Cervical cancer screening: Discussed guidelines. Pap with HPV normal in 2019 and 2020. Next pap due after 06/2022.  - STD Testing: accepts - Birth Control: Discussed options and their risks, benefits and common side effects; discussed VTE with estrogen containing options. Desires: OCPs - Breast Health: Encouraged self breast awareness/SBE. Teaching provided. Discussed limits of clinical breast exam for detecting breast cancer. Rx given for MXR - F/U 12 months and prn   2. CIN I (cervical intraepithelial neoplasia I) Was in 2014 - ASCUS/HPV pos. Had colpo - CIN1.  Pap with HPV normal in 2019 and 2020.  Next pap due after 06/2022.    Routine preventative health maintenance measures emphasized. Please refer to After Visit Summary for other counseling recommendations.     Milas Hock, MD, FACOG Obstetrician & Gynecologist, [facility] for [facility], [facility]

## 2021-11-05 ENCOUNTER — Other Ambulatory Visit: Payer: Self-pay

## 2021-11-05 ENCOUNTER — Ambulatory Visit (INDEPENDENT_AMBULATORY_CARE_PROVIDER_SITE_OTHER): Payer: PRIVATE HEALTH INSURANCE | Admitting: Obstetrics and Gynecology

## 2021-11-05 ENCOUNTER — Other Ambulatory Visit (HOSPITAL_COMMUNITY)
Admission: RE | Admit: 2021-11-05 | Discharge: 2021-11-05 | Disposition: A | Discharge status: Home / Self Care | Payer: 59 | Source: Ambulatory Visit | Attending: Obstetrics and Gynecology | Admitting: Obstetrics and Gynecology

## 2021-11-05 ENCOUNTER — Encounter: Payer: Self-pay | Admitting: Obstetrics and Gynecology

## 2021-11-05 VITALS — BP 95/61 | HR 63 | Resp 16 | Ht 61.0 in | Wt 120.0 lb

## 2021-11-05 DIAGNOSIS — Z01419 Encounter for gynecological examination (general) (routine) without abnormal findings: Secondary | ICD-10-CM | POA: Insufficient documentation

## 2021-11-05 DIAGNOSIS — N870 Mild cervical dysplasia: Secondary | ICD-10-CM

## 2021-11-05 MED ORDER — NORGESTIMATE-ETH ESTRADIOL 0.25-35 MG-MCG PO TABS
1.0000 | ORAL_TABLET | Freq: Every day | ORAL | 3 refills | Status: DC
Start: 2021-11-05 — End: 2023-02-07

## 2021-11-06 LAB — CERVICOVAGINAL ANCILLARY ONLY
Chlamydia: NEGATIVE
Comment: NEGATIVE
Comment: NEGATIVE
Comment: NORMAL
Neisseria Gonorrhea: NEGATIVE
Trichomonas: NEGATIVE

## 2021-11-09 LAB — RPR: RPR Ser Ql: REACTIVE — AB

## 2021-11-09 LAB — HIV ANTIBODY (ROUTINE TESTING W REFLEX): HIV 1&2 Ab, 4th Generation: NONREACTIVE

## 2021-11-09 LAB — HEPATITIS C ANTIBODY
Hepatitis C Ab: NONREACTIVE
SIGNAL TO CUT-OFF: 0.04 (ref <1.00)

## 2021-11-09 LAB — TIQ-MISC

## 2021-11-09 LAB — RPR TITER: RPR Titer: 1:1 {titer} — ABNORMAL HIGH

## 2021-11-09 LAB — HEPATITIS B SURFACE ANTIGEN: Hepatitis B Surface Ag: NONREACTIVE

## 2021-11-09 LAB — FLUORESCENT TREPONEMAL AB(FTA)-IGG-BLD: Fluorescent Treponemal ABS: NONREACTIVE

## 2021-11-28 ENCOUNTER — Other Ambulatory Visit: Payer: Self-pay | Admitting: Sports Medicine

## 2021-11-28 DIAGNOSIS — M7542 Impingement syndrome of left shoulder: Secondary | ICD-10-CM

## 2021-12-11 ENCOUNTER — Encounter: Payer: Self-pay | Admitting: Sports Medicine

## 2021-12-20 ENCOUNTER — Encounter: Payer: Self-pay | Admitting: Physician Assistant

## 2021-12-20 ENCOUNTER — Encounter: Payer: Self-pay | Admitting: Sports Medicine

## 2021-12-21 NOTE — Telephone Encounter (Signed)
Same MyChart message.

## 2022-03-23 ENCOUNTER — Ambulatory Visit: Payer: PRIVATE HEALTH INSURANCE | Admitting: Certified Nurse Midwife

## 2022-03-23 ENCOUNTER — Encounter: Payer: Self-pay | Admitting: Certified Nurse Midwife

## 2022-03-23 ENCOUNTER — Other Ambulatory Visit (HOSPITAL_COMMUNITY)
Admission: RE | Admit: 2022-03-23 | Discharge: 2022-03-23 | Disposition: A | Discharge status: Home / Self Care | Payer: 59 | Source: Ambulatory Visit | Attending: Certified Nurse Midwife | Admitting: Certified Nurse Midwife

## 2022-03-23 VITALS — BP 121/79 | HR 61 | Ht 61.0 in | Wt 130.0 lb

## 2022-03-23 DIAGNOSIS — N898 Other specified noninflammatory disorders of vagina: Secondary | ICD-10-CM | POA: Insufficient documentation

## 2022-03-23 NOTE — Progress Notes (Signed)
? ?  GYNECOLOGY OFFICE VISIT NOTE ? ?History:  ?46 y.o. [id number] here today for vaginal discharge. Reports increased clear discharge, worse after her period. Had unprotected IC recently. No concerns for STDs. Denies itching or malodor. Hx of BV in past and has used boric acid tv. She denies any abnormal bleeding, pelvic pain or other concerns.  ? ?Past Medical History:  ?Diagnosis Date  ? Allergy   ? Heart murmur   ? Herpes   ? ? ?Past Surgical History:  ?Procedure Laterality Date  ? HERNIA REPAIR  1982  ? KNEE SURGERY  08/2011  ? TONSILECTOMY/ADENOIDECTOMY WITH MYRINGOTOMY  2000  ? ? ?The following portions of the patient's history were reviewed and updated as appropriate: allergies, current medications, past family history, past medical history, past social history, past surgical history and problem list.  ? ?Health Maintenance:  Normal pap and negative HRHPV on 07/02/2019.   ? ?Review of Systems:  ?Negative except noted in HPI ?Genito-Urinary ROS: positive for - vulvar/vaginal symptoms ? ? ?Objective:  ?Physical Exam ?BP 121/79   Pulse 61   Ht 5\' 1"  (1.549 m)   Wt 130 lb (59 kg)   LMP 03/14/2022   BMI 24.56 kg/m?  ?CONSTITUTIONAL: Well-developed, well-nourished female in no acute distress.  ?HENT:  Normocephalic, atraumatic ?EYES: Conjunctivae and EOM are normal ?NECK: Normal range of motion ?NEUROLOGIC: Alert and oriented to person, place, and time ?PSYCHIATRIC: Normal mood and affect ?CARDIOVASCULAR: Normal heart rate noted ?RESPIRATORY: Effort and rate normal ?MUSCULOSKELETAL: Normal range of motion ? ?Labs and Imaging ?No results found for this or any previous visit (from the past 24 hour(s)). ? ?Assessment & Plan:  ? ?1. Vaginal discharge   ?Aptima and STD screen ? ?Follow up for annual in August or prn ?Recommend probiotics with lactobacillus and acidophilus ? ? ? ? ?September, CNM ?03/23/2022 11:25 AM ? ?

## 2022-03-24 LAB — CERVICOVAGINAL ANCILLARY ONLY
Bacterial Vaginitis (gardnerella): POSITIVE — AB
Candida Glabrata: NEGATIVE
Candida Vaginitis: NEGATIVE
Chlamydia: NEGATIVE
Comment: NEGATIVE
Comment: NEGATIVE
Comment: NEGATIVE
Comment: NEGATIVE
Comment: NEGATIVE
Comment: NORMAL
Neisseria Gonorrhea: NEGATIVE
Trichomonas: NEGATIVE

## 2022-06-28 ENCOUNTER — Ambulatory Visit (INDEPENDENT_AMBULATORY_CARE_PROVIDER_SITE_OTHER): Payer: 59 | Admitting: Sports Medicine

## 2022-06-28 ENCOUNTER — Encounter: Payer: Self-pay | Admitting: Sports Medicine

## 2022-06-28 DIAGNOSIS — M7542 Impingement syndrome of left shoulder: Secondary | ICD-10-CM

## 2022-06-28 MED ORDER — MELOXICAM 15 MG PO TABS: ORAL_TABLET | ORAL | 3 refills | Status: DC

## 2022-06-28 NOTE — Progress Notes (Signed)
   Virtual Visit via Telephone   I connected with  [place]  on 06/28/22 by telephone/telehealth and verified that I am speaking with the correct person using two identifiers.   I discussed the limitations, risks, security and privacy concerns of performing an evaluation and management service by telephone, including the higher likelihood of inaccurate diagnosis and treatment, and the availability of in person appointments.  We also discussed the likely need of an additional face to face encounter for complete and high quality delivery of care.  I also discussed with the patient that there may be a patient responsible charge related to this service. The patient expressed understanding and wishes to proceed.  Provider location is in medical facility. Patient location is at their home, different from provider location. People involved in care of the patient during this telehealth encounter were myself, my nurse/medical assistant, and my front office/scheduling team member.  Review of Systems: No fevers, chills, night sweats, weight loss, chest pain, or shortness of breath.   Objective Findings:    General: Speaking full sentences, no audible heavy breathing.  Sounds alert and appropriately interactive.    Independent interpretation of tests performed by another provider:   None.  Brief History, Exam, Impression, and Recommendations:    Impingement syndrome, shoulder, left Pleasant 46 year old female, persistent left shoulder pain, she is a Therapist, occupational, historically she has had impingement type symptoms, pain over the deltoid, does wake her from sleep, now worse with abduction and external rotation when batting. Today's visit was virtual but she also endorse some pain over the top of the shoulder. Considering failure of conservative treatment for over a year for the plan will be updated x-rays for insurance company purposes and ultimately an MR arthrogram. I will email her additional  advanced home conditioning, and she can touch base with [place] when she is ready to proceed with advanced imaging.   I discussed the above assessment and treatment plan with the patient. The patient was provided an opportunity to ask questions and all were answered. The patient agreed with the plan and demonstrated an understanding of the instructions.   The patient was advised to call back or seek an in-person evaluation if the symptoms worsen or if the condition fails to improve as anticipated.   I provided 30 minutes of verbal and non-verbal time during this encounter date, time was needed to gather information, review chart, records, communicate/coordinate with staff remotely, as well as complete documentation.   ____________________________________________ [name]. Benjamin Stain, M.D., ABFM., CAQSM., AME. Primary Care and Sports Medicine  [facility]  Adjunct Professor of Family Medicine  [employer] [facility] [employer]  Restaurant manager, fast food

## 2022-06-28 NOTE — Assessment & Plan Note (Signed)
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 Today's visit was virtual but she also endorse some pain over the top of the shoulder. Considering failure of conservative treatment for over a year for the plan will be updated x-rays for insurance company purposes and ultimately an MR arthrogram. I will email her additional advanced home conditioning, and she can touch base with [place] when she is ready to proceed with advanced imaging.

## 2022-06-30 ENCOUNTER — Encounter: Payer: Self-pay | Admitting: Physician Assistant

## 2022-07-15 ENCOUNTER — Encounter (INDEPENDENT_AMBULATORY_CARE_PROVIDER_SITE_OTHER): Payer: 59 | Admitting: Sports Medicine

## 2022-07-15 DIAGNOSIS — M7542 Impingement syndrome of left shoulder: Secondary | ICD-10-CM

## 2022-07-16 NOTE — Telephone Encounter (Signed)
I spent 5 total minutes of online digital evaluation and management services in this patient-initiated request for online care. 

## 2022-07-19 ENCOUNTER — Ambulatory Visit (INDEPENDENT_AMBULATORY_CARE_PROVIDER_SITE_OTHER): Payer: 59

## 2022-07-19 DIAGNOSIS — M25512 Pain in left shoulder: Secondary | ICD-10-CM | POA: Diagnosis not present

## 2022-07-19 DIAGNOSIS — G8929 Other chronic pain: Secondary | ICD-10-CM

## 2022-07-19 DIAGNOSIS — M7542 Impingement syndrome of left shoulder: Secondary | ICD-10-CM

## 2022-07-30 ENCOUNTER — Ambulatory Visit: Payer: 59 | Admitting: Physician Assistant

## 2022-08-10 ENCOUNTER — Ambulatory Visit: Payer: 59 | Admitting: Sports Medicine

## 2022-08-17 ENCOUNTER — Ambulatory Visit (INDEPENDENT_AMBULATORY_CARE_PROVIDER_SITE_OTHER): Payer: 59 | Admitting: Sports Medicine

## 2022-08-17 ENCOUNTER — Ambulatory Visit (INDEPENDENT_AMBULATORY_CARE_PROVIDER_SITE_OTHER): Payer: 59

## 2022-08-17 DIAGNOSIS — M7542 Impingement syndrome of left shoulder: Secondary | ICD-10-CM

## 2022-08-17 NOTE — Progress Notes (Signed)
    Procedures performed today:    Procedure: Real-time Ultrasound Guided gadolinium contrast injection of left glenohumeral joint Device: Samsung HS60  Verbal informed consent obtained.  Time-out conducted.  Noted no overlying erythema, induration, or other signs of local infection.  Skin prepped in a sterile fashion.  Local anesthesia: Topical Ethyl chloride.  With sterile technique and under real time ultrasound guidance: Noted trace effusion, I advanced a 22-gauge spinal needle into the glenohumeral joint from a posterior approach, injected 1 cc kenalog 40, 2 cc lidocaine, 2 cc bupivacaine, syringe switched and 0.1 cc gadolinium injected, syringe again switched and 10 cc sterile saline used to fully distend the joint. Joint visualized and capsule seen distending confirming intra-articular placement of contrast material and medication. Completed without difficulty  Advised to call if fevers/chills, erythema, induration, drainage, or persistent bleeding.  Images permanently stored in PACS Impression: Technically successful ultrasound guided gadolinium contrast injection for MR arthrography.  Please see separate MR arthrogram report.  Independent interpretation of notes and tests performed by another provider:   None.  Brief History, Exam, Impression, and Recommendations:    Impingement syndrome, shoulder, left Please see prior notes for further details, MR arthrogram injection performed today, further management based on results.    ____________________________________________ Gwen Her. Dianah Field, M.D., ABFM., CAQSM., AME. Primary Care and Sports Medicine [facility]  Adjunct Professor of [employer] [facility] [employer]  Risk manager

## 2022-08-17 NOTE — Assessment & Plan Note (Signed)
Please see prior notes for further details, MR arthrogram injection performed today, further management based on results.

## 2022-08-19 NOTE — Addendum Note (Signed)
Addended by: Silverio Decamp on: 08/19/2022 12:05 PM   Modules accepted: Orders

## 2022-08-23 NOTE — Therapy (Signed)
OUTPATIENT PHYSICAL THERAPY SHOULDER EVALUATION   Patient Name: Whitney Turner MRN: [id number] DOB:31-Jul-1976, 46 y.o., female Today's Date: 08/24/2022   PT End of Session - 08/24/22 1006     Visit Number 1    Number of Visits 16    Date for PT Re-Evaluation 10/19/22    PT Start Time 0800    PT Stop Time 0854    PT Time Calculation (min) 54 min    Activity Tolerance Patient tolerated treatment well             Past Medical History:  Diagnosis Date   Allergy    Heart murmur    Herpes    Past Surgical History:  Procedure Laterality Date   HERNIA REPAIR  1982   KNEE SURGERY  08/2011   TONSILECTOMY/ADENOIDECTOMY WITH MYRINGOTOMY  2000   Patient Active Problem List   Diagnosis Date Noted   Poor concentration 12/30/2020   Hyperactive behavior 12/30/2020   Impingement syndrome, shoulder, left 11/17/2020   Elevated LDL cholesterol level 12/24/2019   Right lower quadrant pain 12/24/2019   Right upper quadrant pain 12/24/2019   Effusion of right knee 06/25/2019   Hemorrhoids 12/04/2018   CIN I (cervical intraepithelial neoplasia I) 04/03/2018   Lateral epicondylitis of left elbow 02/15/2018   Left hand pain 02/23/2016   History of repair of ACL of Left knee 01/26/2016   Plantar fasciitis, bilateral 12/29/2015   Genital herpes 12/01/2012   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/2012   Allergic rhinitis 12/01/2012   Seasonal allergies 12/01/2012    PCP: Iran Planas, PA-C  REFERRING PROVIDER: Dr Aundria Mems  REFERRING DIAG: Impingement Lt shoulder   THERAPY DIAG:  Shoulder impingement - Plan: PT plan of care cert/re-cert  Chronic left shoulder pain - Plan: PT plan of care cert/re-cert  Muscle weakness (generalized) - Plan: PT plan of care cert/re-cert  Abnormal posture - Plan: PT plan of care cert/re-cert  Other symptoms and signs involving the musculoskeletal system - Plan: PT plan of care cert/re-cert  Rationale for Evaluation and Treatment Rehabilitation  ONSET  DATE: 08/22/20  SUBJECTIVE:                                                                                                                                                                                      SUBJECTIVE STATEMENT: Lt shoulder pain for about 2 years with increase in symptoms in June. 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 Now has difficulty with any other upper body weights. Received injection 08/17/22 with some improvement.   PERTINENT HISTORY:  Lateral epicondylitis; knee pain; Lt knee scope ACL; arthritis  PAIN:  Are you having pain? Yes: NPRS scale: 2/10 Pain location: Lt shoulder Pain description: constant nagging increased with activity Aggravating factors: lifting; throwing; batting  Relieving factors: injection; avoiding activity   PRECAUTIONS: None   FALLS:  Has patient fallen in last 6 months? No  LIVING ENVIRONMENT: Lives with: lives with their family Lives in: House/apartment   OCCUPATION: Work for [employer]; sports official; driving; in the gym 3 days/wk - aerobics and weights - avoiding UE resistive exercises   PLOF: Independent  PATIENT GOALS get the shoulder better; get shoulder stronger  OBJECTIVE:   DIAGNOSTIC FINDINGS:  MRI Lt shoulder 08/17/22 - 1. Mild tendinosis of the supraspinatus and infraspinatus tendon without a tear. 2. Mild arthropathy of the acromioclavicular joint with marrow edema on either side of the joint.  PATIENT SURVEYS:  FOTO 54  COGNITION:  Overall cognitive status: Within functional limits for tasks assessed     SENSATION: WFL  POSTURE: Patient presents with head forward posture with increased thoracic kyphosis; shoulders rounded and elevated; scapulae abducted and rotated along the thoracic spine; head of the humerus anterior in orientation.   UPPER EXTREMITY ROM:   Active ROM Right eval Left eval  Shoulder flexion 161 151  Shoulder extension 54 54   Shoulder abduction 174 153  Shoulder adduction    Shoulder internal rotation Thumb T7 Thumb T4  Shoulder external rotation 107 shoulder 90/elbow 90 90  shoulder 90/elbow 90  Elbow flexion    Elbow extension    Wrist flexion    Wrist extension    Wrist ulnar deviation    Wrist radial deviation    Wrist pronation    Wrist supination    (Blank rows = not tested)  UPPER EXTREMITY MMT:  MMT Right eval Left eval  Shoulder flexion 5/5 4+/5  Shoulder extension 5/5 5/5  Shoulder abduction 5/5 5-/5  Shoulder adduction    Shoulder internal rotation 5/5 5-/5  Shoulder external rotation 5/5 4+/5  Middle trapezius 4/5 4/5  Lower trapezius 4+/5 4/5  Elbow flexion    Elbow extension    Wrist flexion    Wrist extension    Wrist ulnar deviation    Wrist radial deviation    Wrist pronation    Wrist supination    Grip strength (lbs)    (Blank rows = not tested)  PALPATION:  Muscular tightness Lt > Rt pecs; upper trap; leveator; teres; biceps    TODAY'S TREATMENT:  Postural education and correction  Therapeutic exercise:  Chin tuck 10 sec x 5 Scap squeeze 10 sec x 5  L's x 10 W's x 10  Doorway stretch 3 positions 30 sec x 2 each position  Biceps stretch at counter 30 sec x 2 reps  Supine T on noodle ~ 2 min  Supine and sitting coregeous ball T spine    PATIENT EDUCATION: Education details: POC; HEP; postural correction  Person educated: Patient Education method: Explanation, Demonstration, Tactile cues, Verbal cues, and Handouts Education comprehension: verbalized understanding, returned demonstration, verbal cues required, tactile cues required, and needs further education   HOME EXERCISE PROGRAM: Access Code: [email or web address] URL: [email or web address] Date: 08/24/2022 Prepared by: Gillermo Murdoch  Exercises - Seated Cervical Retraction  - 3 x daily - 7 x weekly - 1 sets - 10 reps - Standing Scapular Retraction  - 3 x daily - 7 x weekly - 1 sets - 10 reps - 10  hold - Shoulder External Rotation and Scapular Retraction  - 3 x daily -  7 x weekly - 1 sets - 10 reps -   hold - Shoulder External Rotation in 45 Degrees Abduction  - 2 x daily - 7 x weekly - 1-2 sets - 10 reps - 3 sec  hold - Doorway Pec Stretch at 60 Degrees Abduction  - 3 x daily - 7 x weekly - 1 sets - 3 reps - Doorway Pec Stretch at 90 Degrees Abduction  - 3 x daily - 7 x weekly - 1 sets - 3 reps - 30 seconds  hold - Doorway Pec Stretch at 120 Degrees Abduction  - 3 x daily - 7 x weekly - 1 sets - 3 reps - 30 second hold  hold - Bicep Stretch at Table  - 2 x daily - 7 x weekly - 1 sets - 3 reps - 30 sec  hold - Chest and Bicep Stretch - Arms Behind Back  - 2 x daily - 7 x weekly - 1 sets - 3 reps - 30 sec  hold - Standing Bicep Stretch at Wall  - 2 x daily - 7 x weekly - 1 sets - 3 reps - 30 sec  hold - Standing Pectoral Release with Ball at Wall  - 2 x daily - 7 x weekly - Standing Infraspinatus/Teres Minor Release with Ball at [employer]  - 2 x daily - 7 x weekly - Supine Chest Stretch on Foam Roll  - 2 x daily - 7 x weekly - 1 sets - 1 reps - 2-5 min  sec  hold  Patient Education - Trigger Point Dry Needling - TENS Unit  ASSESSMENT:  CLINICAL IMPRESSION: Patient is a 46 y.o. female who was seen today for physical therapy evaluation and treatment for impingement Lt shoulder.    OBJECTIVE IMPAIRMENTS decreased activity tolerance, decreased mobility, decreased ROM, decreased strength, increased fascial restrictions, impaired flexibility, impaired UE functional use, improper body mechanics, postural dysfunction, and pain.   ACTIVITY LIMITATIONS carrying, lifting, reach over head, and sports activities   PARTICIPATION LIMITATIONS: cleaning, laundry, community activity, and occupation  PERSONAL FACTORS Past/current experiences, Profession, and 1 comorbidity: arthritis are also affecting patient's functional outcome.   REHAB POTENTIAL: Good  CLINICAL DECISION MAKING:  Stable/uncomplicated  EVALUATION COMPLEXITY: Low   GOALS: Goals reviewed with patient? Yes  SHORT TERM GOALS: Target date: 09/21/2022    Independent in initial HEP Baseline: Goal status: INITIAL  2.  Increase awareness of posture and alignment with posterior shoulder girdle engaged  Baseline:  Goal status: INITIAL  LONG TERM GOALS: Target date: 10/19/2022    Decrease Lt shoulder pain by 75-100% allowing patient to return to all normal work and recreational activities  Baseline:  Goal status: INITIAL  2.  Improve posture and alignment with patient to demonstrate upright posture with posterior shoulder girdle engaged - increased strength in middle and lower traps Baseline:  Goal status: INITIAL  3.  Increase Lt shoulder ROM in end range shoulder flexion and scaption by 5-7 degrees with no pain with ROM  Baseline:  Goal status: INITIAL  4.  Increase strength Lt shoulder to 5/5  Baseline:  Goal status: INITIAL  5.  Independent in HEP with return to gym and resistive training  Baseline:  Goal status: INITIAL  6.  Improve functional limitation score to 71 Baseline: 54 Goal status: INITIAL   PLAN: PT FREQUENCY: 2x/week  PT DURATION: 8 weeks  PLANNED INTERVENTIONS: Therapeutic exercises, Therapeutic activity, Neuromuscular re-education, Patient/Family education, Self Care, Joint mobilization, Aquatic Therapy, Dry Needling,  Electrical stimulation, Cryotherapy, Moist heat, Taping, Ultrasound, Ionotophoresis 4mg /ml Dexamethasone, Manual therapy, and Re-evaluation  PLAN FOR NEXT SESSION: Review and progress with exercise program; manual work and/or modalities as indicated. Continue work on posture and alignment, stretching, strengthening, restoring muscular balance in the shoulder girdle.    Everardo All, PT, MPH  08/24/2022, 10:15 AM

## 2022-08-24 ENCOUNTER — Ambulatory Visit: Payer: 59 | Attending: Sports Medicine | Admitting: Rehabilitative and Restorative Service Providers"

## 2022-08-24 ENCOUNTER — Other Ambulatory Visit: Payer: Self-pay

## 2022-08-24 DIAGNOSIS — M6281 Muscle weakness (generalized): Secondary | ICD-10-CM

## 2022-08-24 DIAGNOSIS — M25512 Pain in left shoulder: Secondary | ICD-10-CM | POA: Diagnosis not present

## 2022-08-24 DIAGNOSIS — M7542 Impingement syndrome of left shoulder: Secondary | ICD-10-CM | POA: Insufficient documentation

## 2022-08-24 DIAGNOSIS — R293 Abnormal posture: Secondary | ICD-10-CM | POA: Diagnosis not present

## 2022-08-24 DIAGNOSIS — R29898 Other symptoms and signs involving the musculoskeletal system: Secondary | ICD-10-CM | POA: Diagnosis not present

## 2022-08-24 DIAGNOSIS — G8929 Other chronic pain: Secondary | ICD-10-CM

## 2022-08-24 DIAGNOSIS — M25819 Other specified joint disorders, unspecified shoulder: Secondary | ICD-10-CM

## 2022-08-26 ENCOUNTER — Ambulatory Visit: Payer: 59 | Admitting: Rehabilitative and Restorative Service Providers"

## 2022-08-26 ENCOUNTER — Encounter: Payer: Self-pay | Admitting: Rehabilitative and Restorative Service Providers"

## 2022-08-26 DIAGNOSIS — M25819 Other specified joint disorders, unspecified shoulder: Secondary | ICD-10-CM

## 2022-08-26 DIAGNOSIS — G8929 Other chronic pain: Secondary | ICD-10-CM

## 2022-08-26 DIAGNOSIS — M6281 Muscle weakness (generalized): Secondary | ICD-10-CM

## 2022-08-26 DIAGNOSIS — R29898 Other symptoms and signs involving the musculoskeletal system: Secondary | ICD-10-CM

## 2022-08-26 DIAGNOSIS — M7542 Impingement syndrome of left shoulder: Secondary | ICD-10-CM | POA: Diagnosis not present

## 2022-08-26 DIAGNOSIS — R293 Abnormal posture: Secondary | ICD-10-CM

## 2022-08-26 NOTE — Therapy (Signed)
OUTPATIENT PHYSICAL THERAPY SHOULDER TREATMENT   Patient Name: Whitney Turner MRN: [id number] DOB:Aug 21, 1976, 46 y.o., female Today's Date: 08/26/2022     Past Medical History:  Diagnosis Date   Allergy    Heart murmur    Herpes    Past Surgical History:  Procedure Laterality Date   HERNIA REPAIR  1982   KNEE SURGERY  08/2011   TONSILECTOMY/ADENOIDECTOMY WITH MYRINGOTOMY  2000   Patient Active Problem List   Diagnosis Date Noted   Poor concentration 12/30/2020   Hyperactive behavior 12/30/2020   Impingement syndrome, shoulder, left 11/17/2020   Elevated LDL cholesterol level 12/24/2019   Right lower quadrant pain 12/24/2019   Right upper quadrant pain 12/24/2019   Effusion of right knee 06/25/2019   Hemorrhoids 12/04/2018   CIN I (cervical intraepithelial neoplasia I) 04/03/2018   Lateral epicondylitis of left elbow 02/15/2018   Left hand pain 02/23/2016   History of repair of ACL of Left knee 01/26/2016   Plantar fasciitis, bilateral 12/29/2015   Genital herpes 12/01/2012   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/2012   Allergic rhinitis 12/01/2012   Seasonal allergies 12/01/2012    PCP: Tandy Gaw, PA-C  REFERRING PROVIDER: Dr Rodney Langton  REFERRING DIAG: Impingement Lt shoulder   THERAPY DIAG:  Shoulder impingement  Chronic left shoulder pain  Muscle weakness (generalized)  Abnormal posture  Other symptoms and signs involving the musculoskeletal system  Rationale for Evaluation and Treatment Rehabilitation  ONSET DATE: 08/22/20  SUBJECTIVE:                                                                                                                                                                                      SUBJECTIVE STATEMENT: 08/26/22: Did the exercises once yesterday. No trouble with exercises  Eval: Lt shoulder pain for about 2 years with increase in symptoms in June. 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 Now has difficulty with any other upper body weights. Received injection 08/17/22 with some improvement.   PERTINENT HISTORY:  Lateral epicondylitis; knee pain; Lt knee scope ACL; arthritis   PAIN:  08/26/22: Are you having pain? Yes: NPRS scale: 0/10 Pain location: Lt shoulder Pain description: constant nagging increased with activity Aggravating factors: lifting; throwing; batting  Relieving factors: injection; avoiding activity   PRECAUTIONS: None   FALLS:  Has patient fallen in last 6 months? No  LIVING ENVIRONMENT: Lives with: lives with their family Lives in: House/apartment   OCCUPATION: Work for [employer]; sports official; driving; in the gym 3 days/wk - aerobics and weights - avoiding UE resistive exercises   PLOF: Independent  PATIENT GOALS get  the shoulder better; get shoulder stronger  OBJECTIVE:   DIAGNOSTIC FINDINGS:  MRI Lt shoulder 08/17/22 - 1. Mild tendinosis of the supraspinatus and infraspinatus tendon without a tear. 2. Mild arthropathy of the acromioclavicular joint with marrow edema on either side of the joint.  PATIENT SURVEYS:  FOTO 54  COGNITION:  Overall cognitive status: Within functional limits for tasks assessed     SENSATION: WFL  POSTURE: Patient presents with head forward posture with increased thoracic kyphosis; shoulders rounded and elevated; scapulae abducted and rotated along the thoracic spine; head of the humerus anterior in orientation.   UPPER EXTREMITY ROM:   Active ROM Right eval Left eval  Shoulder flexion 161 151  Shoulder extension 54 54  Shoulder abduction 174 153  Shoulder adduction    Shoulder internal rotation Thumb T7 Thumb T4  Shoulder external rotation 107 shoulder 90/elbow 90 90  shoulder 90/elbow 90  Elbow flexion    Elbow extension    Wrist flexion    Wrist extension    Wrist ulnar deviation    Wrist radial deviation    Wrist pronation    Wrist supination    (Blank rows = not  tested)  UPPER EXTREMITY MMT:  MMT Right eval Left eval  Shoulder flexion 5/5 4+/5  Shoulder extension 5/5 5/5  Shoulder abduction 5/5 5-/5  Shoulder adduction    Shoulder internal rotation 5/5 5-/5  Shoulder external rotation 5/5 4+/5  Middle trapezius 4/5 4/5  Lower trapezius 4+/5 4/5  Elbow flexion    Elbow extension    Wrist flexion    Wrist extension    Wrist ulnar deviation    Wrist radial deviation    Wrist pronation    Wrist supination    Grip strength (lbs)    (Blank rows = not tested)  PALPATION:  Muscular tightness Lt > Rt pecs; upper trap; leveator; teres; biceps    TODAY'S TREATMENT:  Postural education and correction  Therapeutic exercise:  Chin tuck 10 sec x 5 Scap squeeze 10 sec x 5  L's x 10 W's x 10  Doorway stretch 3 positions 30 sec x 2 each position  Biceps stretch at counter 30 sec x 2 reps  Supine T on noodle ~ 2 min  Supine and sitting coregeous ball T spine  Biceps stretch standing 20 sec x 2 reps  Isometric row blue TB 15-20 sec hold x 5 Bow and arrow blue TB x 10 each side 3 sec hold    Trigger Point Dry-Needling  Treatment instructions: Expect mild to moderate muscle soreness. S/S of pneumothorax if dry needled over a lung field, and to seek immediate medical attention should they occur. Patient verbalized understanding of these instructions and education.  Patient Consent Given: Yes Education handout provided: Yes Muscles treated: Lt pecs, biceps  Electrical stimulation performed: Yes Parameters: mAmp x 5 min to pt tolerance Treatment response/outcome: decreased muscular tightness   Modalities:  Moist heat anterior shoulder x 10 min   PATIENT EDUCATION: Education details: POC; HEP; postural correction  Person educated: Patient Education method: Explanation, Demonstration, Tactile cues, Verbal cues, and Handouts Education comprehension: verbalized understanding, returned demonstration, verbal cues required, tactile cues  required, and needs further education   HOME EXERCISE PROGRAM: Access Code: [email or web address] URL: [email or web address] Date: 08/26/2022 Prepared by: Corlis Leak  Exercises - Seated Cervical Retraction  - 3 x daily - 7 x weekly - 1 sets - 10 reps - Standing Scapular Retraction  - 3 x daily - 7 x  weekly - 1 sets - 10 reps - 10 hold - Shoulder External Rotation and Scapular Retraction  - 3 x daily - 7 x weekly - 1 sets - 10 reps -   hold - Shoulder External Rotation in 45 Degrees Abduction  - 2 x daily - 7 x weekly - 1-2 sets - 10 reps - 3 sec  hold - Doorway Pec Stretch at 60 Degrees Abduction  - 3 x daily - 7 x weekly - 1 sets - 3 reps - Doorway Pec Stretch at 90 Degrees Abduction  - 3 x daily - 7 x weekly - 1 sets - 3 reps - 30 seconds  hold - Doorway Pec Stretch at 120 Degrees Abduction  - 3 x daily - 7 x weekly - 1 sets - 3 reps - 30 second hold  hold - Bicep Stretch at Table  - 2 x daily - 7 x weekly - 1 sets - 3 reps - 30 sec  hold - Chest and Bicep Stretch - Arms Behind Back  - 2 x daily - 7 x weekly - 1 sets - 3 reps - 30 sec  hold - Standing Bicep Stretch at Wall  - 2 x daily - 7 x weekly - 1 sets - 3 reps - 30 sec  hold - Standing Pectoral Release with Ball at Wall  - 2 x daily - 7 x weekly - Standing Infraspinatus/Teres Minor Release with Ball at [employer]  - 2 x daily - 7 x weekly - Supine Chest Stretch on Foam Roll  - 2 x daily - 7 x weekly - 1 sets - 1 reps - 2-5 min  sec  hold - Bicep Stretch at Table  - 2 x daily - 7 x weekly - 1 sets - 3 reps - 20-30 sec  hold - Supine Lower Trunk Rotation  - 1 x daily - 7 x weekly - 1 sets - 3-5 reps - 20-30 sec  hold - Standing Shoulder Row Reactive Isometric  - 1 x daily - 7 x weekly - 1 sets - 10 reps - 30-45 sec  hold - Drawing Bow  - 1 x daily - 7 x weekly - 2-3 sets - 10 reps - 3 sec  hold  Patient Education - Trigger Point Dry Needling - TENS Unit  ASSESSMENT:  CLINICAL IMPRESSION: 08/26/22: reviewed exercises. Added  posterior shoulder girdle strengthening. Trial of DN to Lt pecs and biceps tolerated well.   Eval: Patient is a 46 y.o. female who was seen today for physical therapy evaluation and treatment for impingement Lt shoulder.    OBJECTIVE IMPAIRMENTS decreased activity tolerance, decreased mobility, decreased ROM, decreased strength, increased fascial restrictions, impaired flexibility, impaired UE functional use, improper body mechanics, postural dysfunction, and pain.   ACTIVITY LIMITATIONS carrying, lifting, reach over head, and sports activities   PARTICIPATION LIMITATIONS: cleaning, laundry, community activity, and occupation  PERSONAL FACTORS Past/current experiences, Profession, and 1 comorbidity: arthritis are also affecting patient's functional outcome.   REHAB POTENTIAL: Good  CLINICAL DECISION MAKING: Stable/uncomplicated  EVALUATION COMPLEXITY: Low   GOALS: Goals reviewed with patient? Yes  SHORT TERM GOALS: Target date: 09/21/2022    Independent in initial HEP Baseline: Goal status: INITIAL  2.  Increase awareness of posture and alignment with posterior shoulder girdle engaged  Baseline:  Goal status: INITIAL  LONG TERM GOALS: Target date: 10/19/2022    Decrease Lt shoulder pain by 75-100% allowing patient to return to all normal work and recreational activities  Baseline:  Goal status: INITIAL  2.  Improve posture and alignment with patient to demonstrate upright posture with posterior shoulder girdle engaged - increased strength in middle and lower traps Baseline:  Goal status: INITIAL  3.  Increase Lt shoulder ROM in end range shoulder flexion and scaption by 5-7 degrees with no pain with ROM  Baseline:  Goal status: INITIAL  4.  Increase strength Lt shoulder to 5/5  Baseline:  Goal status: INITIAL  5.  Independent in HEP with return to gym and resistive training  Baseline:  Goal status: INITIAL  6.  Improve functional limitation score to  71 Baseline: 54 Goal status: INITIAL   PLAN: PT FREQUENCY: 2x/week  PT DURATION: 8 weeks  PLANNED INTERVENTIONS: Therapeutic exercises, Therapeutic activity, Neuromuscular re-education, Patient/Family education, Self Care, Joint mobilization, Aquatic Therapy, Dry Needling, Electrical stimulation, Cryotherapy, Moist heat, Taping, Ultrasound, Ionotophoresis 4mg /ml Dexamethasone, Manual therapy, and Re-evaluation  PLAN FOR NEXT SESSION: Review and progress with exercise program; manual work and/or modalities as indicated. Continue work on posture and alignment, stretching, strengthening, restoring muscular balance in the shoulder girdle.    Everardo All, PT, MPH  08/26/2022, 8:47 AM

## 2022-08-30 ENCOUNTER — Encounter: Payer: Self-pay | Admitting: Rehabilitative and Restorative Service Providers"

## 2022-08-30 ENCOUNTER — Ambulatory Visit: Payer: 59 | Admitting: Rehabilitative and Restorative Service Providers"

## 2022-08-30 DIAGNOSIS — M7542 Impingement syndrome of left shoulder: Secondary | ICD-10-CM | POA: Diagnosis not present

## 2022-08-30 DIAGNOSIS — R29898 Other symptoms and signs involving the musculoskeletal system: Secondary | ICD-10-CM

## 2022-08-30 DIAGNOSIS — M6281 Muscle weakness (generalized): Secondary | ICD-10-CM

## 2022-08-30 DIAGNOSIS — M25819 Other specified joint disorders, unspecified shoulder: Secondary | ICD-10-CM

## 2022-08-30 DIAGNOSIS — R293 Abnormal posture: Secondary | ICD-10-CM

## 2022-08-30 DIAGNOSIS — G8929 Other chronic pain: Secondary | ICD-10-CM

## 2022-08-30 NOTE — Therapy (Addendum)
OUTPATIENT PHYSICAL THERAPY SHOULDER TREATMENT   Patient Name: Whitney Turner MRN: [id number] DOB:1975-12-11, 46 y.o., female Today's Date: 08/30/2022   PT End of Session - 08/30/22 0805     Visit Number 3    Number of Visits 16    Date for PT Re-Evaluation 10/19/22    PT Start Time 0804    PT Stop Time 0852    PT Time Calculation (min) 48 min    Activity Tolerance Patient tolerated treatment well              Past Medical History:  Diagnosis Date   Allergy    Heart murmur    Herpes    Past Surgical History:  Procedure Laterality Date   HERNIA REPAIR  1982   KNEE SURGERY  08/2011   TONSILECTOMY/ADENOIDECTOMY WITH MYRINGOTOMY  2000   Patient Active Problem List   Diagnosis Date Noted   Poor concentration 12/30/2020   Hyperactive behavior 12/30/2020   Impingement syndrome, shoulder, left 11/17/2020   Elevated LDL cholesterol level 12/24/2019   Right lower quadrant pain 12/24/2019   Right upper quadrant pain 12/24/2019   Effusion of right knee 06/25/2019   Hemorrhoids 12/04/2018   CIN I (cervical intraepithelial neoplasia I) 04/03/2018   Lateral epicondylitis of left elbow 02/15/2018   Left hand pain 02/23/2016   History of repair of ACL of Left knee 01/26/2016   Plantar fasciitis, bilateral 12/29/2015   Genital herpes 12/01/2012   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/2012   Allergic rhinitis 12/01/2012   Seasonal allergies 12/01/2012    PCP: Iran Planas, PA-C  REFERRING PROVIDER: Dr Aundria Mems  REFERRING DIAG: Impingement Lt shoulder   THERAPY DIAG:  Shoulder impingement  Chronic left shoulder pain  Muscle weakness (generalized)  Abnormal posture  Other symptoms and signs involving the musculoskeletal system  Rationale for Evaluation and Treatment Rehabilitation  ONSET DATE: 08/22/20  SUBJECTIVE:                                                                                                                                                                                       SUBJECTIVE STATEMENT: 08/30/22: Did the exercises at home. No problems with the exercises. She has not had pain in her shoulder.  Eval: Lt shoulder pain for about 2 years with increase in symptoms in June. 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 Now has difficulty with any other upper body weights. Received injection 08/17/22 with some improvement.   PERTINENT HISTORY:  Lateral epicondylitis; knee pain; Lt knee scope ACL; arthritis   PAIN:  08/30/22: Are you having pain? Yes: NPRS scale: 0/10 Pain  location: Lt shoulder Pain description: constant nagging increased with activity Aggravating factors: lifting; throwing; batting  Relieving factors: injection; avoiding activity   PRECAUTIONS: None   FALLS:  Has patient fallen in last 6 months? 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 Mild tendinosis of the supraspinatus and infraspinatus tendon without a tear. 2. Mild arthropathy of the acromioclavicular joint with marrow edema on either side of the joint.  PATIENT SURVEYS:  FOTO 54  COGNITION:  Overall cognitive status: Within functional limits for tasks assessed     SENSATION: WFL  POSTURE: Patient presents with head forward posture with increased thoracic kyphosis; shoulders rounded and elevated; scapulae abducted and rotated along the thoracic spine; head of the humerus anterior in orientation.   UPPER EXTREMITY ROM:   Active ROM Right eval Left eval  Shoulder flexion 161 151  Shoulder extension 54 54  Shoulder abduction 174 153  Shoulder adduction    Shoulder  internal rotation Thumb T7 Thumb T4  Shoulder external rotation 107 shoulder 90/elbow 90 90  shoulder 90/elbow 90  Elbow flexion    Elbow extension    Wrist flexion    Wrist extension    Wrist ulnar deviation    Wrist radial deviation    Wrist pronation    Wrist supination    (Blank rows = not tested)  UPPER EXTREMITY MMT:  MMT Right eval Left eval  Shoulder flexion 5/5 4+/5  Shoulder extension 5/5 5/5  Shoulder abduction 5/5 5-/5  Shoulder adduction    Shoulder internal rotation 5/5 5-/5  Shoulder external rotation 5/5 4+/5  Middle trapezius 4/5 4/5  Lower trapezius 4+/5 4/5  Elbow flexion    Elbow extension    Wrist flexion    Wrist extension    Wrist ulnar deviation    Wrist radial deviation    Wrist pronation    Wrist supination    Grip strength (lbs)    (Blank rows = not tested)  PALPATION:  Muscular tightness Lt > Rt pecs; upper trap; leveator; teres; biceps    TODAY'S TREATMENT:  Postural education and correction  08/30/22:  Therapeutic exercise:  UBE L6 x 4 min 2 min fwd/2 min back Doorway stretch 3 positions 30 [place] x 2 each  Biceps stretch standing 20 sec x 2 reps  Prone scap squeeze arms @ side 10 sec x10 Isometric row blue TB 15-20 sec hold x 5 Bow and arrow blue TB x 10 each side 3 sec hold  Isometric shoulder IR blue TB x 10 sec x 10  Isometric shoulder ER blue TB x 10 x 10 sec    08/26/22: Chin tuck 10 sec x 5 Scap squeeze 10 sec x 5  L's x 10 W's x 10  Doorway stretch 3 positions 30 sec x 2 each position  Biceps stretch at counter 30 sec x 2 reps  Supine T on noodle ~ 2 min  Supine and sitting coregeous ball T spine  Biceps stretch standing 20 sec x 2 reps  Isometric row blue TB 15-20 sec hold x 5 Bow and arrow blue TB x 10 each side 3 sec hold    Trigger Point Dry-Needling  08/30/22: Treatment instructions: Expect mild to moderate muscle soreness. S/S of pneumothorax if dry needled over a lung field, and to seek immediate medical  attention should they occur. Patient verbalized understanding of these instructions and education.  Patient Consent Given: Yes Education handout provided: Yes Muscles treated: Lt thoracic paraspinals: infraspinatus; upper traps rhomboids  Electrical stimulation performed: Yes Parameters: mAmp x 5 min to pt tolerance Treatment response/outcome: decreased muscular tightness   Modalities:  Moist heat anterior shoulder x 10 min   PATIENT EDUCATION: Education details: POC; HEP; postural correction  Person educated: Patient Education method: Explanation, Demonstration, Tactile cues, Verbal cues, and Handouts Education comprehension: verbalized understanding, returned demonstration, verbal cues required, tactile cues required, and needs further education   HOME EXERCISE PROGRAM: Access Code: [email or web address] URL: [email or web address] Date: 08/30/2022 Prepared by: Corlis Leak  Exercises - Seated Cervical Retraction  - 3 x daily - 7 x weekly - 1 sets - 10 reps - Standing Scapular Retraction  - 3 x daily - 7 x weekly - 1 sets - 10 reps - 10 hold - Shoulder External Rotation and Scapular Retraction  - 3 x daily - 7 x weekly - 1 sets - 10 reps -   hold - Shoulder External Rotation in 45 Degrees Abduction  - 2 x daily - 7 x weekly - 1-2 sets - 10 reps - 3 sec  hold - Doorway Pec Stretch at 60 Degrees Abduction  - 3 x daily - 7 x weekly - 1 sets - 3 reps - Doorway Pec Stretch at 90 Degrees Abduction  - 3 x daily - 7 x weekly - 1 sets - 3 reps - 30 seconds  hold - Doorway Pec Stretch at 120 Degrees Abduction  - 3 x daily - 7 x weekly - 1 sets - 3 reps - 30 second hold  hold - Bicep Stretch at Table  - 2 x daily - 7 x weekly - 1 sets - 3 reps - 30 sec  hold - Chest and Bicep Stretch - Arms Behind Back  - 2 x daily - 7 x weekly - 1 sets - 3 reps - 30 sec  hold - Standing Bicep Stretch at Wall  - 2 x daily - 7 x weekly - 1 sets - 3 reps - 30 sec  hold - Standing Pectoral Release with Ball at  Wall  - 2 x daily - 7 x weekly - Standing Infraspinatus/Teres Minor Release with Ball at [employer]  - 2 x daily - 7 x weekly - Supine Chest Stretch on Foam Roll  - 2 x daily - 7 x weekly - 1 sets - 1 reps - 2-5 min  sec  hold - Bicep Stretch at Table  - 2 x daily - 7 x weekly - 1 sets - 3 reps - 20-30 sec  hold - Supine Lower Trunk Rotation  - 1 x daily - 7 x weekly - 1 sets - 3-5 reps - 20-30 sec  hold - Standing Shoulder Row Reactive Isometric  - 1 x daily - 7 x weekly - 1 sets - 10 reps - 30-45 sec  hold - Drawing Bow  - 1 x daily - 7 x weekly - 2-3 sets - 10 reps - 3 sec  hold - Shoulder External Rotation Reactive Isometrics  - 1 x daily - 7 x weekly - 1 sets - 5-10 reps - 10-30 sec  hold - Shoulder Internal Rotation Reactive Isometrics  - 2 x daily - 7  x weekly - 1 sets - 10 reps - 3-5 sec  hold - Prone Scapular Retraction  - 2 x daily - 7 x weekly - 1 sets - 5-10 reps - 3-5 sec  hold  Patient Education - Trigger Point Dry Needling - TENS Unit  ASSESSMENT:  CLINICAL IMPRESSION: 08/26/22: Reviewed exercises. Progressed posterior shoulder girdle strengthening. Continue DN to Lt shoulder girdle today. Continued muscular imbalance with weakness in the posterior shoulder girdle and over work in the pecs, biceps, deltoid, upper trap. Needs to strengthening middle and lower trap.   Eval: Patient is a 46 y.o. female who was seen today for physical therapy evaluation and treatment for impingement Lt shoulder.    OBJECTIVE IMPAIRMENTS decreased activity tolerance, decreased mobility, decreased ROM, decreased strength, increased fascial restrictions, impaired flexibility, impaired UE functional use, improper body mechanics, postural dysfunction, and pain.   ACTIVITY LIMITATIONS carrying, lifting, reach over head, and sports activities   PARTICIPATION LIMITATIONS: cleaning, laundry, community activity, and occupation  PERSONAL FACTORS Past/current experiences, Profession, and 1 comorbidity: arthritis  are also affecting patient's functional outcome.   REHAB POTENTIAL: Good  CLINICAL DECISION MAKING: Stable/uncomplicated  EVALUATION COMPLEXITY: Low   GOALS: Goals reviewed with patient? Yes  SHORT TERM GOALS: Target date: 09/21/2022    Independent in initial HEP Baseline: Goal status: INITIAL  2.  Increase awareness of posture and alignment with posterior shoulder girdle engaged  Baseline:  Goal status: INITIAL  LONG TERM GOALS: Target date: 10/19/2022    Decrease Lt shoulder pain by 75-100% allowing patient to return to all normal work and recreational activities  Baseline:  Goal status: INITIAL  2.  Improve posture and alignment with patient to demonstrate upright posture with posterior shoulder girdle engaged - increased strength in middle and lower traps Baseline:  Goal status: INITIAL  3.  Increase Lt shoulder ROM in end range shoulder flexion and scaption by 5-7 degrees with no pain with ROM  Baseline:  Goal status: INITIAL  4.  Increase strength Lt shoulder to 5/5  Baseline:  Goal status: INITIAL  5.  Independent in HEP with return to gym and resistive training  Baseline:  Goal status: INITIAL  6.  Improve functional limitation score to 71 Baseline: 54 Goal status: INITIAL   PLAN: PT FREQUENCY: 2x/week  PT DURATION: 8 weeks  PLANNED INTERVENTIONS: Therapeutic exercises, Therapeutic activity, Neuromuscular re-education, Patient/Family education, Self Care, Joint mobilization, Aquatic Therapy, Dry Needling, Electrical stimulation, Cryotherapy, Moist heat, Taping, Ultrasound, Ionotophoresis 4mg /ml Dexamethasone, Manual therapy, and Re-evaluation  PLAN FOR NEXT SESSION: Review and progress with exercise program; manual work and/or modalities as indicated. Continue work on posture and alignment, stretching, strengthening, restoring muscular balance in the shoulder girdle.    , PT, MPH  08/30/2022, 10:53 AM

## 2022-09-01 ENCOUNTER — Ambulatory Visit: Payer: 59 | Admitting: Physical Therapy

## 2022-09-01 ENCOUNTER — Encounter: Payer: Self-pay | Admitting: Physical Therapy

## 2022-09-01 DIAGNOSIS — M6281 Muscle weakness (generalized): Secondary | ICD-10-CM

## 2022-09-01 DIAGNOSIS — G8929 Other chronic pain: Secondary | ICD-10-CM

## 2022-09-01 DIAGNOSIS — M7542 Impingement syndrome of left shoulder: Secondary | ICD-10-CM | POA: Diagnosis not present

## 2022-09-01 DIAGNOSIS — M25819 Other specified joint disorders, unspecified shoulder: Secondary | ICD-10-CM

## 2022-09-01 DIAGNOSIS — R293 Abnormal posture: Secondary | ICD-10-CM

## 2022-09-01 DIAGNOSIS — R29898 Other symptoms and signs involving the musculoskeletal system: Secondary | ICD-10-CM

## 2022-09-01 NOTE — Therapy (Signed)
OUTPATIENT PHYSICAL THERAPY SHOULDER TREATMENT   Patient Name: Whitney Turner MRN: [id number] DOB:11-22-1976, 46 y.o., female Today's Date: 09/01/2022   PT End of Session - 09/01/22 0847     Visit Number 4    Number of Visits 16    Date for PT Re-Evaluation 10/19/22    PT Start Time 0847    PT Stop Time 0930    PT Time Calculation (min) 43 min    Activity Tolerance Patient tolerated treatment well    Behavior During Therapy [facility] for tasks assessed/performed              Past Medical History:  Diagnosis Date   Allergy    Heart murmur    Herpes    Past Surgical History:  Procedure Laterality Date   [place]  08/2011   TONSILECTOMY/ADENOIDECTOMY WITH MYRINGOTOMY  2000   Patient Active Problem List   Diagnosis Date Noted   Poor concentration 12/30/2020   Hyperactive behavior 12/30/2020   Impingement syndrome, shoulder, left 11/17/2020   Elevated LDL cholesterol level 12/24/2019   Right lower quadrant pain 12/24/2019   Right upper quadrant pain 12/24/2019   Effusion of right knee 06/25/2019   Hemorrhoids 12/04/2018   CIN I (cervical intraepithelial neoplasia I) 04/03/2018   Lateral epicondylitis of left elbow 02/15/2018   Left hand pain 02/23/2016   History of repair of ACL of Left knee 01/26/2016   Plantar fasciitis, bilateral 12/29/2015   Genital herpes 12/01/2012   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/2012   Allergic rhinitis 12/01/2012   Seasonal allergies 12/01/2012    PCP: Iran Planas, PA-C  REFERRING PROVIDER: Dr Aundria Mems  REFERRING DIAG: Impingement Lt shoulder   THERAPY DIAG:  Shoulder impingement  Chronic left shoulder pain  Muscle weakness (generalized)  Abnormal posture  Other symptoms and signs involving the musculoskeletal system  Rationale for Evaluation and Treatment Rehabilitation  ONSET DATE: 08/22/20  SUBJECTIVE:                                                                                                                                                                                       SUBJECTIVE STATEMENT: 09/01/22: Pt reports shoulder has been doing okay.   Eval: Lt shoulder pain for about 2 years with increase in symptoms in June. 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 Now has difficulty with any other upper body weights. Received injection 08/17/22 with some improvement.   PERTINENT HISTORY:  Lateral epicondylitis; knee pain; Lt knee scope ACL; arthritis   PAIN:  08/30/22: Are you having pain? Yes: NPRS scale: 0/10 Pain  location: Lt shoulder Pain description: constant nagging increased with activity Aggravating factors: lifting; throwing; batting  Relieving factors: injection; avoiding activity   PRECAUTIONS: None   FALLS:  Has patient fallen in last 6 months? No  LIVING ENVIRONMENT: Lives with: lives with their family Lives in: House/apartment   OCCUPATION: Work for [employer]; sports official; driving; in the gym 3 days/wk - aerobics and weights - avoiding UE resistive exercises   PLOF: Independent  PATIENT GOALS get the shoulder better; get shoulder stronger  OBJECTIVE:   DIAGNOSTIC FINDINGS:  MRI Lt shoulder 08/17/22 - 1. Mild tendinosis of the supraspinatus and infraspinatus tendon without a tear. 2. Mild arthropathy of the acromioclavicular joint with marrow edema on either side of the joint.  PATIENT SURVEYS:  FOTO 54  COGNITION:  Overall cognitive status: Within functional limits for tasks assessed     SENSATION: WFL  POSTURE: Patient presents with head forward posture with increased thoracic kyphosis; shoulders rounded and elevated; scapulae abducted and rotated along the thoracic spine; head of the humerus anterior in orientation.   UPPER EXTREMITY ROM:   Active ROM Right eval Left eval  Shoulder flexion 161 151  Shoulder extension 54 54  Shoulder abduction 174 153  Shoulder adduction     Shoulder internal rotation Thumb T7 Thumb T4  Shoulder external rotation 107 shoulder 90/elbow 90 90  shoulder 90/elbow 90  Elbow flexion    Elbow extension    Wrist flexion    Wrist extension    Wrist ulnar deviation    Wrist radial deviation    Wrist pronation    Wrist supination    (Blank rows = not tested)  UPPER EXTREMITY MMT:  MMT Right eval Left eval  Shoulder flexion 5/5 4+/5  Shoulder extension 5/5 5/5  Shoulder abduction 5/5 5-/5  Shoulder adduction    Shoulder internal rotation 5/5 5-/5  Shoulder external rotation 5/5 4+/5  Middle trapezius 4/5 4/5  Lower trapezius 4+/5 4/5  Elbow flexion    Elbow extension    Wrist flexion    Wrist extension    Wrist ulnar deviation    Wrist radial deviation    Wrist pronation    Wrist supination    Grip strength (lbs)    (Blank rows = not tested)  PALPATION:  Muscular tightness Lt > Rt pecs; upper trap; leveator; teres; biceps    TODAY'S TREATMENT:  09/01/22: THEREX UBE L4 x 4 min 2 min fwd/2 min back Doorway stretch 3 positions 30 [place] x 2 each Prone "I", "T", "Y" 2x10 (AAROM for T and Y)  MANUAL THERAPY Skilled assessment and palpation for TPDN Trigger Point Dry-Needling  09/01/22: Treatment instructions: Expect mild to moderate muscle soreness. S/S of pneumothorax if dry needled over a lung field, and to seek immediate medical attention should they occur. Patient verbalized understanding of these instructions and education.  Patient Consent Given: Yes Education handout provided: Yes Muscles treated: Lt thoracic paraspinals, lats, subscap Electrical stimulation performed: No Parameters: n/a Treatment response/outcome: decreased muscular tightness   MODALITIES Moist heat x 10 min at end of session   08/30/22:  Therapeutic exercise:  UBE L6 x 4 min 2 min fwd/2 min back Doorway stretch 3 positions 30 [place] x 2 each  Biceps stretch standing 20 sec x 2 reps  Prone scap squeeze arms @ side 10 sec  x10 Isometric row blue TB 15-20 sec hold x 5 Bow and arrow blue TB x 10 each side 3 sec hold  Isometric shoulder  IR blue TB x 10 sec x 10  Isometric shoulder ER blue TB x 10 x 10 sec   Trigger Point Dry-Needling  08/30/22: Treatment instructions: Expect mild to moderate muscle soreness. S/S of pneumothorax if dry needled over a lung field, and to seek immediate medical attention should they occur. Patient verbalized understanding of these instructions and education.  Patient Consent Given: Yes Education handout provided: Yes Muscles treated: Lt thoracic paraspinals: infraspinatus; upper traps rhomboids  Electrical stimulation performed: Yes Parameters: mAmp x 5 min to pt tolerance Treatment response/outcome: decreased muscular tightness   Modalities:  Moist heat anterior shoulder x 10 min    08/26/22: Chin tuck 10 sec x 5 Scap squeeze 10 sec x 5  L's x 10 W's x 10  Doorway stretch 3 positions 30 sec x 2 each position  Biceps stretch at counter 30 sec x 2 reps  Supine T on noodle ~ 2 min  Supine and sitting coregeous ball T spine  Biceps stretch standing 20 sec x 2 reps  Isometric row blue TB 15-20 sec hold x 5 Bow and arrow blue TB x 10 each side 3 sec hold     PATIENT EDUCATION: Education details: POC; HEP; postural correction  Person educated: Patient Education method: Explanation, Demonstration, Tactile cues, Verbal cues, and Handouts Education comprehension: verbalized understanding, returned demonstration, verbal cues required, tactile cues required, and needs further education   HOME EXERCISE PROGRAM: Access Code: [email or web address] URL: [email or web address] Date: 08/30/2022 Prepared by: Gillermo Murdoch  Exercises - Seated Cervical Retraction  - 3 x daily - 7 x weekly - 1 sets - 10 reps - Standing Scapular Retraction  - 3 x daily - 7 x weekly - 1 sets - 10 reps - 10 hold - Shoulder External Rotation and Scapular Retraction  - 3 x daily - 7 x weekly - 1 sets -  10 reps -   hold - Shoulder External Rotation in 45 Degrees Abduction  - 2 x daily - 7 x weekly - 1-2 sets - 10 reps - 3 sec  hold - Doorway Pec Stretch at 60 Degrees Abduction  - 3 x daily - 7 x weekly - 1 sets - 3 reps - Doorway Pec Stretch at 90 Degrees Abduction  - 3 x daily - 7 x weekly - 1 sets - 3 reps - 30 seconds  hold - Doorway Pec Stretch at 120 Degrees Abduction  - 3 x daily - 7 x weekly - 1 sets - 3 reps - 30 second hold  hold - Bicep Stretch at Table  - 2 x daily - 7 x weekly - 1 sets - 3 reps - 30 sec  hold - Chest and Bicep Stretch - Arms Behind Back  - 2 x daily - 7 x weekly - 1 sets - 3 reps - 30 sec  hold - Standing Bicep Stretch at Wall  - 2 x daily - 7 x weekly - 1 sets - 3 reps - 30 sec  hold - Standing Pectoral Release with Ball at Wall  - 2 x daily - 7 x weekly - Standing Infraspinatus/Teres Minor Release with Ball at [employer]  - 2 x daily - 7 x weekly - Supine Chest Stretch on Foam Roll  - 2 x daily - 7 x weekly - 1 sets - 1 reps - 2-5 min  sec  hold - Bicep Stretch at Table  - 2 x daily - 7 x weekly - 1 sets - 3 reps -  20-30 sec  hold - Supine Lower Trunk Rotation  - 1 x daily - 7 x weekly - 1 sets - 3-5 reps - 20-30 sec  hold - Standing Shoulder Row Reactive Isometric  - 1 x daily - 7 x weekly - 1 sets - 10 reps - 30-45 sec  hold - Drawing Bow  - 1 x daily - 7 x weekly - 2-3 sets - 10 reps - 3 sec  hold - Shoulder External Rotation Reactive Isometrics  - 1 x daily - 7 x weekly - 1 sets - 5-10 reps - 10-30 sec  hold - Shoulder Internal Rotation Reactive Isometrics  - 2 x daily - 7 x weekly - 1 sets - 10 reps - 3-5 sec  hold - Prone Scapular Retraction  - 2 x daily - 7 x weekly - 1 sets - 5-10 reps - 3-5 sec  hold  Patient Education - Trigger Point Dry Needling - TENS Unit  ASSESSMENT:  CLINICAL IMPRESSION: 09/01/22: Continued to provide TPDN and manual work for posterior shoulder. Performed trial of needling along teres major/minor and lats and subscap. Difficulty  activating low trap in prone without compensatory movements.   Eval: Patient is a 46 y.o. female who was seen today for physical therapy evaluation and treatment for impingement Lt shoulder.    OBJECTIVE IMPAIRMENTS decreased activity tolerance, decreased mobility, decreased ROM, decreased strength, increased fascial restrictions, impaired flexibility, impaired UE functional use, improper body mechanics, postural dysfunction, and pain.   ACTIVITY LIMITATIONS carrying, lifting, reach over head, and sports activities   PARTICIPATION LIMITATIONS: cleaning, laundry, community activity, and occupation  PERSONAL FACTORS Past/current experiences, Profession, and 1 comorbidity: arthritis are also affecting patient's functional outcome.   REHAB POTENTIAL: Good  CLINICAL DECISION MAKING: Stable/uncomplicated  EVALUATION COMPLEXITY: Low   GOALS: Goals reviewed with patient? Yes  SHORT TERM GOALS: Target date: 09/21/2022    Independent in initial HEP Baseline: Goal status: INITIAL  2.  Increase awareness of posture and alignment with posterior shoulder girdle engaged  Baseline:  Goal status: INITIAL  LONG TERM GOALS: Target date: 10/19/2022    Decrease Lt shoulder pain by 75-100% allowing patient to return to all normal work and recreational activities  Baseline:  Goal status: INITIAL  2.  Improve posture and alignment with patient to demonstrate upright posture with posterior shoulder girdle engaged - increased strength in middle and lower traps Baseline:  Goal status: INITIAL  3.  Increase Lt shoulder ROM in end range shoulder flexion and scaption by 5-7 degrees with no pain with ROM  Baseline:  Goal status: INITIAL  4.  Increase strength Lt shoulder to 5/5  Baseline:  Goal status: INITIAL  5.  Independent in HEP with return to gym and resistive training  Baseline:  Goal status: INITIAL  6.  Improve functional limitation score to 71 Baseline: 54 Goal status:  INITIAL   PLAN: PT FREQUENCY: 2x/week  PT DURATION: 8 weeks  PLANNED INTERVENTIONS: Therapeutic exercises, Therapeutic activity, Neuromuscular re-education, Patient/Family education, Self Care, Joint mobilization, Aquatic Therapy, Dry Needling, Electrical stimulation, Cryotherapy, Moist heat, Taping, Ultrasound, Ionotophoresis 4mg /ml Dexamethasone, Manual therapy, and Re-evaluation  PLAN FOR NEXT SESSION: Review and progress with exercise program; manual work and/or modalities as indicated. Continue work on posture and alignment, stretching, strengthening, restoring muscular balance in the shoulder girdle.    Anaise Sterbenz April Ma L Dainelle Hun, PT, MPH  09/01/2022, 8:47 AM

## 2022-09-06 ENCOUNTER — Encounter: Payer: Self-pay | Admitting: Rehabilitative and Restorative Service Providers"

## 2022-09-06 ENCOUNTER — Telehealth: Payer: Self-pay | Admitting: *Deleted

## 2022-09-06 ENCOUNTER — Ambulatory Visit: Payer: 59 | Admitting: Rehabilitative and Restorative Service Providers"

## 2022-09-06 DIAGNOSIS — G8929 Other chronic pain: Secondary | ICD-10-CM

## 2022-09-06 DIAGNOSIS — M6281 Muscle weakness (generalized): Secondary | ICD-10-CM

## 2022-09-06 DIAGNOSIS — M25819 Other specified joint disorders, unspecified shoulder: Secondary | ICD-10-CM

## 2022-09-06 DIAGNOSIS — M7542 Impingement syndrome of left shoulder: Secondary | ICD-10-CM | POA: Diagnosis not present

## 2022-09-06 DIAGNOSIS — R29898 Other symptoms and signs involving the musculoskeletal system: Secondary | ICD-10-CM

## 2022-09-06 DIAGNOSIS — R293 Abnormal posture: Secondary | ICD-10-CM

## 2022-09-06 NOTE — Telephone Encounter (Signed)
Returned call from 1:51 PM. Left patient a message to call and schedule appointment.

## 2022-09-06 NOTE — Therapy (Signed)
OUTPATIENT PHYSICAL THERAPY SHOULDER TREATMENT   Patient Name: Whitney Turner MRN: [id number] DOB:1976/07/09, 46 y.o., female Today's Date: 09/06/2022   PT End of Session - 09/06/22 0802     Visit Number 5    Number of Visits 16    Date for PT Re-Evaluation 10/19/22    PT Start Time 0800    PT Stop Time 0848    PT Time Calculation (min) 48 min    Activity Tolerance Patient tolerated treatment well              Past Medical History:  Diagnosis Date   Allergy    Heart murmur    Herpes    Past Surgical History:  Procedure Laterality Date   HERNIA REPAIR  1982   KNEE SURGERY  08/2011   TONSILECTOMY/ADENOIDECTOMY WITH MYRINGOTOMY  2000   Patient Active Problem List   Diagnosis Date Noted   Poor concentration 12/30/2020   Hyperactive behavior 12/30/2020   Impingement syndrome, shoulder, left 11/17/2020   Elevated LDL cholesterol level 12/24/2019   Right lower quadrant pain 12/24/2019   Right upper quadrant pain 12/24/2019   Effusion of right knee 06/25/2019   Hemorrhoids 12/04/2018   CIN I (cervical intraepithelial neoplasia I) 04/03/2018   Lateral epicondylitis of left elbow 02/15/2018   Left hand pain 02/23/2016   History of repair of ACL of Left knee 01/26/2016   Plantar fasciitis, bilateral 12/29/2015   Genital herpes 12/01/2012   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/2012   Allergic rhinitis 12/01/2012   Seasonal allergies 12/01/2012    PCP: Tandy Gaw, PA-C  REFERRING PROVIDER: Dr Rodney Langton  REFERRING DIAG: Impingement Lt shoulder   THERAPY DIAG:  Shoulder impingement  Chronic left shoulder pain  Muscle weakness (generalized)  Abnormal posture  Other symptoms and signs involving the musculoskeletal system  Rationale for Evaluation and Treatment Rehabilitation  ONSET DATE: 08/22/20  SUBJECTIVE:                                                                                                                                                                                       SUBJECTIVE STATEMENT: 09/06/22: Pt reports that her shoulder did Okay. She worked out at [employer] Thursday and was sore for a few days. Resolved by Sunday. She was hit by a softball in the front of the Lt shoulder Saturday. That just created a bruise. Noticed tightness in the Rt upper back which improved some with the dry needling.   Eval: Lt shoulder pain for about 2 years with increase in symptoms in June. 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 Now has difficulty  with any other upper body weights. Received injection 08/17/22 with some improvement.   PERTINENT HISTORY:  Lateral epicondylitis; knee pain; Lt knee scope ACL; arthritis   PAIN:  09/06/22: Are you having pain? Yes: NPRS scale: 2-3/10 Pain location: Lt shoulder Pain description: constant nagging increased with activity Aggravating factors: lifting; throwing; batting  Relieving factors: injection; avoiding activity   PRECAUTIONS: None   FALLS:  Has patient fallen in last 6 months? No  LIVING ENVIRONMENT: Lives with: lives with their family Lives in: House/apartment   OCCUPATION: Work for [employer]; sports official; driving; in the gym 3 days/wk - aerobics and weights - avoiding UE resistive exercises   PLOF: Independent  PATIENT GOALS get the shoulder better; get shoulder stronger  OBJECTIVE:    09/06/22: AROM Lt shoulder approximating AROM Rt with some persistent tightness in the upper shoulder.  Palpation: tightness and discomfort Lt biceps tendon and insertioni of lats Lt   DIAGNOSTIC FINDINGS:  MRI Lt shoulder 08/17/22 - 1. Mild tendinosis of the supraspinatus and infraspinatus tendon without a tear. 2. Mild arthropathy of the acromioclavicular joint with marrow edema on either side of the joint.  PATIENT SURVEYS:  FOTO 54  COGNITION:  Overall cognitive status: Within functional limits for tasks  assessed     SENSATION: WFL  POSTURE: Patient presents with head forward posture with increased thoracic kyphosis; shoulders rounded and elevated; scapulae abducted and rotated along the thoracic spine; head of the humerus anterior in orientation.   UPPER EXTREMITY ROM:   Active ROM Right eval Left eval  Shoulder flexion 161 151  Shoulder extension 54 54  Shoulder abduction 174 153  Shoulder adduction    Shoulder internal rotation Thumb T7 Thumb T4  Shoulder external rotation 107 shoulder 90/elbow 90 90  shoulder 90/elbow 90  Elbow flexion    Elbow extension    Wrist flexion    Wrist extension    Wrist ulnar deviation    Wrist radial deviation    Wrist pronation    Wrist supination    (Blank rows = not tested)  UPPER EXTREMITY MMT:  MMT Right eval Left eval  Shoulder flexion 5/5 4+/5  Shoulder extension 5/5 5/5  Shoulder abduction 5/5 5-/5  Shoulder adduction    Shoulder internal rotation 5/5 5-/5  Shoulder external rotation 5/5 4+/5  Middle trapezius 4/5 4/5  Lower trapezius 4+/5 4/5  Elbow flexion    Elbow extension    Wrist flexion    Wrist extension    Wrist ulnar deviation    Wrist radial deviation    Wrist pronation    Wrist supination    Grip strength (lbs)    (Blank rows = not tested)  PALPATION:  Muscular tightness Lt > Rt pecs; upper trap; leveator; teres; biceps    TODAY'S TREATMENT:  09/06/22: THEREX UBE L4 x 5 min 3 min fwd/2 min back Doorway stretch 3 positions 30 [place] x 2 each Prone "W", "I", "T", "Y" 2x10 (AAROM for T and Y) Lat supine with PT assist x 3 30-45 sec hold  Prayer stretch 2 reps 45 sec hold   MANUAL THERAPY Skilled assessment and palpation for TPDN Trigger Point Dry-Needling  09/06/22: Treatment instructions: Expect mild to moderate muscle soreness. S/S of pneumothorax if dry needled over a lung field, and to seek immediate medical attention should they occur. Patient verbalized understanding of these instructions and  education.  Patient Consent Given: Yes Education handout provided: Yes Muscles treated: biceps and biceps tendon Electrical stimulation  performed: yes Parameters: mAmp x 5 to tolerance Treatment response/outcome: decreased muscular tightness   MODALITIES Moist heat x 10 min at end of session   08/30/22:  Therapeutic exercise:  UBE L6 x 4 min 2 min fwd/2 min back Doorway stretch 3 positions 30 [place] x 2 each  Biceps stretch standing 20 sec x 2 reps  Prone scap squeeze arms @ side 10 sec x10 Isometric row blue TB 15-20 sec hold x 5 Bow and arrow blue TB x 10 each side 3 sec hold  Isometric shoulder IR blue TB x 10 sec x 10  Isometric shoulder ER blue TB x 10 x 10 sec   Trigger Point Dry-Needling  08/30/22: Treatment instructions: Expect mild to moderate muscle soreness. S/S of pneumothorax if dry needled over a lung field, and to seek immediate medical attention should they occur. Patient verbalized understanding of these instructions and education.  Patient Consent Given: Yes Education handout provided: Yes Muscles treated: Lt thoracic paraspinals: infraspinatus; upper traps rhomboids  Electrical stimulation performed: Yes Parameters: mAmp x 5 min to pt tolerance Treatment response/outcome: decreased muscular tightness   Modalities:  Moist heat anterior shoulder x 10 min    08/26/22: Chin tuck 10 sec x 5 Scap squeeze 10 sec x 5  L's x 10 W's x 10  Doorway stretch 3 positions 30 sec x 2 each position  Biceps stretch at counter 30 sec x 2 reps  Supine T on noodle ~ 2 min  Supine and sitting coregeous ball T spine  Biceps stretch standing 20 sec x 2 reps  Isometric row blue TB 15-20 sec hold x 5 Bow and arrow blue TB x 10 each side 3 sec hold     PATIENT EDUCATION: Education details: POC; HEP; postural correction  Person educated: Patient Education method: Explanation, Demonstration, Tactile cues, Verbal cues, and Handouts Education comprehension: verbalized  understanding, returned demonstration, verbal cues required, tactile cues required, and needs further education   HOME EXERCISE PROGRAM: Access Code: [email or web address] URL: [email or web address] Date: 08/30/2022 Prepared by: Gillermo Murdoch  Exercises - Seated Cervical Retraction  - 3 x daily - 7 x weekly - 1 sets - 10 reps - Standing Scapular Retraction  - 3 x daily - 7 x weekly - 1 sets - 10 reps - 10 hold - Shoulder External Rotation and Scapular Retraction  - 3 x daily - 7 x weekly - 1 sets - 10 reps -   hold - Shoulder External Rotation in 45 Degrees Abduction  - 2 x daily - 7 x weekly - 1-2 sets - 10 reps - 3 sec  hold - Doorway Pec Stretch at 60 Degrees Abduction  - 3 x daily - 7 x weekly - 1 sets - 3 reps - Doorway Pec Stretch at 90 Degrees Abduction  - 3 x daily - 7 x weekly - 1 sets - 3 reps - 30 seconds  hold - Doorway Pec Stretch at 120 Degrees Abduction  - 3 x daily - 7 x weekly - 1 sets - 3 reps - 30 second hold  hold - Bicep Stretch at Table  - 2 x daily - 7 x weekly - 1 sets - 3 reps - 30 sec  hold - Chest and Bicep Stretch - Arms Behind Back  - 2 x daily - 7 x weekly - 1 sets - 3 reps - 30 sec  hold - Standing Bicep Stretch at Wall  - 2 x daily - 7 x weekly - 1  sets - 3 reps - 30 sec  hold - Standing Pectoral Release with Ball at [employer]  - 2 x daily - 7 x weekly - Standing Infraspinatus/Teres Minor Release with Ball at [employer]  - 2 x daily - 7 x weekly - Supine Chest Stretch on Foam Roll  - 2 x daily - 7 x weekly - 1 sets - 1 reps - 2-5 min  sec  hold - Bicep Stretch at Table  - 2 x daily - 7 x weekly - 1 sets - 3 reps - 20-30 sec  hold - Supine Lower Trunk Rotation  - 1 x daily - 7 x weekly - 1 sets - 3-5 reps - 20-30 sec  hold - Standing Shoulder Row Reactive Isometric  - 1 x daily - 7 x weekly - 1 sets - 10 reps - 30-45 sec  hold - Drawing Bow  - 1 x daily - 7 x weekly - 2-3 sets - 10 reps - 3 sec  hold - Shoulder External Rotation Reactive Isometrics  - 1 x daily - 7 x  weekly - 1 sets - 5-10 reps - 10-30 sec  hold - Shoulder Internal Rotation Reactive Isometrics  - 2 x daily - 7 x weekly - 1 sets - 10 reps - 3-5 sec  hold - Prone Scapular Retraction  - 2 x daily - 7 x weekly - 1 sets - 5-10 reps - 3-5 sec  hold  Patient Education - Trigger Point Dry Needling - TENS Unit  ASSESSMENT:  CLINICAL IMPRESSION: 09/06/22: Tightness in biceps tendon and lats on Lt. DN to biceps tendon and biceps with estim. Working on focus. Continued work in prone to activate middle and lower trap. Difficulty activating low trap in prone without compensatory movements.   Eval: Patient is a 46 y.o. female who was seen today for physical therapy evaluation and treatment for impingement Lt shoulder.    OBJECTIVE IMPAIRMENTS decreased activity tolerance, decreased mobility, decreased ROM, decreased strength, increased fascial restrictions, impaired flexibility, impaired UE functional use, improper body mechanics, postural dysfunction, and pain.   ACTIVITY LIMITATIONS carrying, lifting, reach over head, and sports activities   PARTICIPATION LIMITATIONS: cleaning, laundry, community activity, and occupation  PERSONAL FACTORS Past/current experiences, Profession, and 1 comorbidity: arthritis are also affecting patient's functional outcome.   REHAB POTENTIAL: Good  CLINICAL DECISION MAKING: Stable/uncomplicated  EVALUATION COMPLEXITY: Low   GOALS: Goals reviewed with patient? Yes  SHORT TERM GOALS: Target date: 09/21/2022    Independent in initial HEP Baseline: Goal status: INITIAL  2.  Increase awareness of posture and alignment with posterior shoulder girdle engaged  Baseline:  Goal status: INITIAL  LONG TERM GOALS: Target date: 10/19/2022    Decrease Lt shoulder pain by 75-100% allowing patient to return to all normal work and recreational activities  Baseline:  Goal status: INITIAL  2.  Improve posture and alignment with patient to demonstrate upright posture  with posterior shoulder girdle engaged - increased strength in middle and lower traps Baseline:  Goal status: INITIAL  3.  Increase Lt shoulder ROM in end range shoulder flexion and scaption by 5-7 degrees with no pain with ROM  Baseline:  Goal status: INITIAL  4.  Increase strength Lt shoulder to 5/5  Baseline:  Goal status: INITIAL  5.  Independent in HEP with return to gym and resistive training  Baseline:  Goal status: INITIAL  6.  Improve functional limitation score to 71 Baseline: 54 Goal status: INITIAL   PLAN: PT  FREQUENCY: 2x/week  PT DURATION: 8 weeks  PLANNED INTERVENTIONS: Therapeutic exercises, Therapeutic activity, Neuromuscular re-education, Patient/Family education, Self Care, Joint mobilization, Aquatic Therapy, Dry Needling, Electrical stimulation, Cryotherapy, Moist heat, Taping, Ultrasound, Ionotophoresis 4mg /ml Dexamethasone, Manual therapy, and Re-evaluation  PLAN FOR NEXT SESSION: Review and progress with exercise program; manual work and/or modalities as indicated. Continue work on posture and alignment, stretching, strengthening, restoring muscular balance in the shoulder girdle. Trial of eccentric biceps work to increased strength in biceps    Cordai Rodrigue Rober MinionP Aitanna Haubner, PT, MPH  09/06/2022, 8:51 AM

## 2022-09-08 ENCOUNTER — Ambulatory Visit: Payer: 59 | Admitting: Rehabilitative and Restorative Service Providers"

## 2022-09-08 ENCOUNTER — Encounter: Payer: Self-pay | Admitting: Rehabilitative and Restorative Service Providers"

## 2022-09-08 DIAGNOSIS — R293 Abnormal posture: Secondary | ICD-10-CM

## 2022-09-08 DIAGNOSIS — M25819 Other specified joint disorders, unspecified shoulder: Secondary | ICD-10-CM

## 2022-09-08 DIAGNOSIS — M6281 Muscle weakness (generalized): Secondary | ICD-10-CM

## 2022-09-08 DIAGNOSIS — R29898 Other symptoms and signs involving the musculoskeletal system: Secondary | ICD-10-CM

## 2022-09-08 DIAGNOSIS — M7542 Impingement syndrome of left shoulder: Secondary | ICD-10-CM | POA: Diagnosis not present

## 2022-09-08 DIAGNOSIS — G8929 Other chronic pain: Secondary | ICD-10-CM

## 2022-09-08 NOTE — Therapy (Signed)
OUTPATIENT PHYSICAL THERAPY SHOULDER TREATMENT   Patient Name: Whitney Turner MRN: [id number] DOB:1976/09/08, 46 y.o., female Today's Date: 09/08/2022   PT End of Session - 09/08/22 0805     Visit Number 6    Number of Visits 16    Date for PT Re-Evaluation 10/19/22    PT Start Time 0804    PT Stop Time 0849    PT Time Calculation (min) 45 min              Past Medical History:  Diagnosis Date   Allergy    Heart murmur    Herpes    Past Surgical History:  Procedure Laterality Date   HERNIA REPAIR  1982   KNEE SURGERY  08/2011   TONSILECTOMY/ADENOIDECTOMY WITH MYRINGOTOMY  2000   Patient Active Problem List   Diagnosis Date Noted   Poor concentration 12/30/2020   Hyperactive behavior 12/30/2020   Impingement syndrome, shoulder, left 11/17/2020   Elevated LDL cholesterol level 12/24/2019   Right lower quadrant pain 12/24/2019   Right upper quadrant pain 12/24/2019   Effusion of right knee 06/25/2019   Hemorrhoids 12/04/2018   CIN I (cervical intraepithelial neoplasia I) 04/03/2018   Lateral epicondylitis of left elbow 02/15/2018   Left hand pain 02/23/2016   History of repair of ACL of Left knee 01/26/2016   Plantar fasciitis, bilateral 12/29/2015   Genital herpes 12/01/2012   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/2012   Allergic rhinitis 12/01/2012   Seasonal allergies 12/01/2012    PCP: Tandy Gaw, PA-C  REFERRING PROVIDER: Dr Rodney Langton  REFERRING DIAG: Impingement Lt shoulder   THERAPY DIAG:  Shoulder impingement  Chronic left shoulder pain  Muscle weakness (generalized)  Abnormal posture  Other symptoms and signs involving the musculoskeletal system  Rationale for Evaluation and Treatment Rehabilitation  ONSET DATE: 08/22/20  SUBJECTIVE:                                                                                                                                                                                      SUBJECTIVE  STATEMENT: 09/08/22: Pt reports that her shoulder and arm did okay after DN Monday. She worked out at [employer] She feels some tightness in the mid back now Rt > Lt. Always sore in the upper thoracic spine.   Eval: Lt shoulder pain for about 2 years with increase in symptoms in June. 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 Now has difficulty with any other upper body weights. Received injection 08/17/22 with some improvement.   PERTINENT HISTORY:  Lateral epicondylitis; knee pain; Lt knee scope ACL; arthritis   PAIN:  09/06/22: Are you having pain? Yes: NPRS scale: 0/10 Pain location: Lt shoulder Pain description: intermittent, nagging, increased with activity Aggravating factors: lifting; throwing; batting  Relieving factors: injection; avoiding activity   PRECAUTIONS: None   FALLS:  Has patient fallen in last 6 months? No  LIVING ENVIRONMENT: Lives with: lives with their family Lives in: House/apartment   OCCUPATION: Work for [employer]; sports official; driving; in the gym 3 days/wk - aerobics and weights - avoiding UE resistive exercises   PLOF: Independent  PATIENT GOALS get the shoulder better; get shoulder stronger  OBJECTIVE:    09/06/22: AROM Lt shoulder approximating AROM Rt with some persistent tightness in the upper shoulder.  Palpation: tightness and discomfort Lt biceps tendon and insertioni of lats Lt   DIAGNOSTIC FINDINGS:  MRI Lt shoulder 08/17/22 - 1. Mild tendinosis of the supraspinatus and infraspinatus tendon without a tear. 2. Mild arthropathy of the acromioclavicular joint with marrow edema on either side of the joint.  PATIENT SURVEYS:  FOTO 54  COGNITION:  Overall cognitive status: Within functional limits for tasks assessed     SENSATION: WFL  POSTURE: Patient presents with head forward posture with increased thoracic kyphosis; shoulders rounded and elevated; scapulae abducted and rotated  along the thoracic spine; head of the humerus anterior in orientation.   UPPER EXTREMITY ROM:   Active ROM Right eval Left eval  Shoulder flexion 161 151  Shoulder extension 54 54  Shoulder abduction 174 153  Shoulder adduction    Shoulder internal rotation Thumb T7 Thumb T4  Shoulder external rotation 107 shoulder 90/elbow 90 90  shoulder 90/elbow 90  Elbow flexion    Elbow extension    Wrist flexion    Wrist extension    Wrist ulnar deviation    Wrist radial deviation    Wrist pronation    Wrist supination    (Blank rows = not tested)  UPPER EXTREMITY MMT:  MMT Right eval Left eval  Shoulder flexion 5/5 4+/5  Shoulder extension 5/5 5/5  Shoulder abduction 5/5 5-/5  Shoulder adduction    Shoulder internal rotation 5/5 5-/5  Shoulder external rotation 5/5 4+/5  Middle trapezius 4/5 4/5  Lower trapezius 4+/5 4/5  Elbow flexion    Elbow extension    Wrist flexion    Wrist extension    Wrist ulnar deviation    Wrist radial deviation    Wrist pronation    Wrist supination    Grip strength (lbs)    (Blank rows = not tested)  PALPATION:  Muscular tightness Lt > Rt pecs; upper trap; leveator; teres; biceps    TODAY'S TREATMENT:  09/08/22: THEREX UBE L4 x 4 min 2 min fwd/2 min back Doorway stretch 3 positions 30 [place] x 2 each Biceps stretch standing 30 sec x 2  Prone arms at side, "W", "I", "T", "Y" 5 to 10 reps Lat supine with PT assist x 3 30-45 sec hold    MANUAL THERAPY Thoracic PA and lateral mobs mid thoracic area patient sitting upper body supported on treatment table - Grade II/III mobs T3 to T5    Skilled assessment and palpation for TPDN Trigger Point Dry-Needling  09/08/22: Treatment instructions: Expect mild to moderate muscle soreness. S/S of pneumothorax if dry needled over a lung field, and to seek immediate medical attention should they occur. Patient verbalized understanding of these instructions and education.  Patient Consent Given:  Yes Education handout provided: Yes Muscles treated: thoracic paraspinals T3-5 bilat; Lt International aid/development worker  stimulation performed: yes Parameters: mAmp x 5 to tolerance Treatment response/outcome: decreased muscular tightness   Taping: kinesotape bilat shoulders for anterior instability 1 strop head of humerus to distal medial scapulae; inhibition of upper trap 1 strip upper trap to medial scapulae/thoracic area   MODALITIES Moist heat x 10 min at end of session   08/30/22:  Therapeutic exercise:  UBE L6 x 4 min 2 min fwd/2 min back Doorway stretch 3 positions 30 [place] x 2 each  Biceps stretch standing 20 sec x 2 reps  Prone scap squeeze arms @ side 10 sec x10 Isometric row blue TB 15-20 sec hold x 5 Bow and arrow blue TB x 10 each side 3 sec hold  Isometric shoulder IR blue TB x 10 sec x 10  Isometric shoulder ER blue TB x 10 x 10 sec   Trigger Point Dry-Needling  08/30/22: Treatment instructions: Expect mild to moderate muscle soreness. S/S of pneumothorax if dry needled over a lung field, and to seek immediate medical attention should they occur. Patient verbalized understanding of these instructions and education.  Patient Consent Given: Yes Education handout provided: Yes Muscles treated: Lt thoracic paraspinals: infraspinatus; upper traps rhomboids  Electrical stimulation performed: Yes Parameters: mAmp x 5 min to pt tolerance Treatment response/outcome: decreased muscular tightness   Modalities:  Moist heat anterior shoulder x 10 min     PATIENT EDUCATION: Education details: POC; HEP; postural correction  Person educated: Patient Education method: Explanation, Demonstration, Tactile cues, Verbal cues, and Handouts Education comprehension: verbalized understanding, returned demonstration, verbal cues required, tactile cues required, and needs further education   HOME EXERCISE PROGRAM: Access Code: [email or web address] URL: [email or web address] Date:  08/30/2022 Prepared by: Corlis Leak  Exercises - Seated Cervical Retraction  - 3 x daily - 7 x weekly - 1 sets - 10 reps - Standing Scapular Retraction  - 3 x daily - 7 x weekly - 1 sets - 10 reps - 10 hold - Shoulder External Rotation and Scapular Retraction  - 3 x daily - 7 x weekly - 1 sets - 10 reps -   hold - Shoulder External Rotation in 45 Degrees Abduction  - 2 x daily - 7 x weekly - 1-2 sets - 10 reps - 3 sec  hold - Doorway Pec Stretch at 60 Degrees Abduction  - 3 x daily - 7 x weekly - 1 sets - 3 reps - Doorway Pec Stretch at 90 Degrees Abduction  - 3 x daily - 7 x weekly - 1 sets - 3 reps - 30 seconds  hold - Doorway Pec Stretch at 120 Degrees Abduction  - 3 x daily - 7 x weekly - 1 sets - 3 reps - 30 second hold  hold - Bicep Stretch at Table  - 2 x daily - 7 x weekly - 1 sets - 3 reps - 30 sec  hold - Chest and Bicep Stretch - Arms Behind Back  - 2 x daily - 7 x weekly - 1 sets - 3 reps - 30 sec  hold - Standing Bicep Stretch at Wall  - 2 x daily - 7 x weekly - 1 sets - 3 reps - 30 sec  hold - Standing Pectoral Release with Ball at Wall  - 2 x daily - 7 x weekly - Standing Infraspinatus/Teres Minor Release with Ball at [employer]  - 2 x daily - 7 x weekly - Supine Chest Stretch on Foam Roll  - 2 x daily - 7 x weekly -  1 sets - 1 reps - 2-5 min  sec  hold - Bicep Stretch at Table  - 2 x daily - 7 x weekly - 1 sets - 3 reps - 20-30 sec  hold - Supine Lower Trunk Rotation  - 1 x daily - 7 x weekly - 1 sets - 3-5 reps - 20-30 sec  hold - Standing Shoulder Row Reactive Isometric  - 1 x daily - 7 x weekly - 1 sets - 10 reps - 30-45 sec  hold - Drawing Bow  - 1 x daily - 7 x weekly - 2-3 sets - 10 reps - 3 sec  hold - Shoulder External Rotation Reactive Isometrics  - 1 x daily - 7 x weekly - 1 sets - 5-10 reps - 10-30 sec  hold - Shoulder Internal Rotation Reactive Isometrics  - 2 x daily - 7 x weekly - 1 sets - 10 reps - 3-5 sec  hold - Prone Scapular Retraction  - 2 x daily - 7 x weekly - 1  sets - 5-10 reps - 3-5 sec  hold  Patient Education - Trigger Point Dry Needling - TENS Unit  ASSESSMENT:  CLINICAL IMPRESSION: 09/08/22: Tightness and tenderness in the thoracic spine with PA and lateral mobs. Tightness in biceps tendon and lats on Lt. DN to bilat thoracic paraspinals and middle trap at spine ~T3-T5 area with estim. Working to improve thoracic extension in the mid thoracic area and activating middle and lower trap. Difficulty activating low trap in prone without compensatory movements through upper trap but improving.   Eval: Patient is a 46 y.o. female who was seen today for physical therapy evaluation and treatment for impingement Lt shoulder.    OBJECTIVE IMPAIRMENTS decreased activity tolerance, decreased mobility, decreased ROM, decreased strength, increased fascial restrictions, impaired flexibility, impaired UE functional use, improper body mechanics, postural dysfunction, and pain.   ACTIVITY LIMITATIONS carrying, lifting, reach over head, and sports activities   PARTICIPATION LIMITATIONS: cleaning, laundry, community activity, and occupation  PERSONAL FACTORS Past/current experiences, Profession, and 1 comorbidity: arthritis are also affecting patient's functional outcome.   REHAB POTENTIAL: Good  CLINICAL DECISION MAKING: Stable/uncomplicated  EVALUATION COMPLEXITY: Low   GOALS: Goals reviewed with patient? Yes  SHORT TERM GOALS: Target date: 09/21/2022    Independent in initial HEP Baseline: Goal status: INITIAL  2.  Increase awareness of posture and alignment with posterior shoulder girdle engaged  Baseline:  Goal status: INITIAL  LONG TERM GOALS: Target date: 10/19/2022    Decrease Lt shoulder pain by 75-100% allowing patient to return to all normal work and recreational activities  Baseline:  Goal status: INITIAL  2.  Improve posture and alignment with patient to demonstrate upright posture with posterior shoulder girdle engaged -  increased strength in middle and lower traps Baseline:  Goal status: INITIAL  3.  Increase Lt shoulder ROM in end range shoulder flexion and scaption by 5-7 degrees with no pain with ROM  Baseline:  Goal status: INITIAL  4.  Increase strength Lt shoulder to 5/5  Baseline:  Goal status: INITIAL  5.  Independent in HEP with return to gym and resistive training  Baseline:  Goal status: INITIAL  6.  Improve functional limitation score to 71 Baseline: 54 Goal status: INITIAL   PLAN: PT FREQUENCY: 2x/week  PT DURATION: 8 weeks  PLANNED INTERVENTIONS: Therapeutic exercises, Therapeutic activity, Neuromuscular re-education, Patient/Family education, Self Care, Joint mobilization, Aquatic Therapy, Dry Needling, Electrical stimulation, Cryotherapy, Moist heat, Taping, Ultrasound, Ionotophoresis 4mg /ml  Dexamethasone, Manual therapy, and Re-evaluation  PLAN FOR NEXT SESSION: Review and progress with exercise program; manual work and/or modalities as indicated. Continue work on posture and alignment, stretching, strengthening, restoring muscular balance in the shoulder girdle working o]posterior shoulder girdle - middle and lower traps with inhibition of upper traps. Consider trial of eccentric biceps work to increased strength in biceps    Rashon Rezek Rober MinionP Marquest Gunkel, PT, MPH  09/08/2022, 9:24 AM

## 2022-09-14 ENCOUNTER — Encounter: Payer: Self-pay | Admitting: Physical Therapy

## 2022-09-14 ENCOUNTER — Ambulatory Visit: Payer: 59 | Admitting: Physical Therapy

## 2022-09-14 DIAGNOSIS — M7542 Impingement syndrome of left shoulder: Secondary | ICD-10-CM | POA: Diagnosis not present

## 2022-09-14 DIAGNOSIS — G8929 Other chronic pain: Secondary | ICD-10-CM

## 2022-09-14 DIAGNOSIS — M25819 Other specified joint disorders, unspecified shoulder: Secondary | ICD-10-CM

## 2022-09-14 DIAGNOSIS — R293 Abnormal posture: Secondary | ICD-10-CM

## 2022-09-14 DIAGNOSIS — M6281 Muscle weakness (generalized): Secondary | ICD-10-CM

## 2022-09-14 DIAGNOSIS — R29898 Other symptoms and signs involving the musculoskeletal system: Secondary | ICD-10-CM

## 2022-09-14 NOTE — Therapy (Signed)
OUTPATIENT PHYSICAL THERAPY SHOULDER TREATMENT   Patient Name: Whitney Turner MRN: [id number] DOB:07/24/1976, 46 y.o., female Today's Date: 09/14/2022   PT End of Session - 09/14/22 0803     Visit Number 7    Number of Visits 16    Date for PT Re-Evaluation 10/19/22    PT Start Time 0803    PT Stop Time 0845    PT Time Calculation (min) 42 min    Activity Tolerance Patient tolerated treatment well    Behavior During Therapy [facility] for tasks assessed/performed              Past Medical History:  Diagnosis Date   Allergy    Heart murmur    Herpes    Past Surgical History:  Procedure Laterality Date   HERNIA REPAIR  1982   KNEE SURGERY  08/2011   TONSILECTOMY/ADENOIDECTOMY WITH MYRINGOTOMY  2000   Patient Active Problem List   Diagnosis Date Noted   Poor concentration 12/30/2020   Hyperactive behavior 12/30/2020   Impingement syndrome, shoulder, left 11/17/2020   Elevated LDL cholesterol level 12/24/2019   Right lower quadrant pain 12/24/2019   Right upper quadrant pain 12/24/2019   Effusion of right knee 06/25/2019   Hemorrhoids 12/04/2018   CIN I (cervical intraepithelial neoplasia I) 04/03/2018   Lateral epicondylitis of left elbow 02/15/2018   Left hand pain 02/23/2016   History of repair of ACL of Left knee 01/26/2016   Plantar fasciitis, bilateral 12/29/2015   Genital herpes 12/01/2012   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/2012   Allergic rhinitis 12/01/2012   Seasonal allergies 12/01/2012    PCP: Tandy Gaw, PA-C  REFERRING PROVIDER: Dr Rodney Langton  REFERRING DIAG: Impingement Lt shoulder   THERAPY DIAG:  Shoulder impingement  Chronic left shoulder pain  Muscle weakness (generalized)  Other symptoms and signs involving the musculoskeletal system  Abnormal posture  Rationale for Evaluation and Treatment Rehabilitation  ONSET DATE: 08/22/20  SUBJECTIVE:                                                                                                                                                                                       SUBJECTIVE STATEMENT: Pt states that there was a more intense needling after last session -- feels sore. Pt reports her shoulder was taped and could feel it was changing her posture. Tape stayed on until Sunday night. Pt reports that she can feel the shot is wearing off. Pt reports that she did 1 hour of catching and didn't have any pain.   Eval: Lt shoulder pain for about 2 years with increase in symptoms in June. She plays and coaches softball and notices  that she can not throw or hit the softball. Now has difficulty with any other upper body weights. Received injection 08/17/22 with some improvement.   PERTINENT HISTORY:  Lateral epicondylitis; knee pain; Lt knee scope ACL; arthritis   PAIN:  09/06/22: Are you having pain? Yes: NPRS scale: "just a little sore"  Pain location: Lt shoulder Pain description: intermittent, nagging, increased with activity Aggravating factors: lifting; throwing; batting  Relieving factors: injection; avoiding activity   PRECAUTIONS: None   FALLS:  Has patient fallen in last 6 months? No  LIVING ENVIRONMENT: Lives with: lives with their family Lives in: House/apartment   OCCUPATION: Work for [employer]; sports official; driving; in the gym 3 days/wk - aerobics and weights - avoiding UE resistive exercises   PLOF: Independent  PATIENT GOALS get the shoulder better; get shoulder stronger  OBJECTIVE:    09/06/22: AROM Lt shoulder approximating AROM Rt with some persistent tightness in the upper shoulder.  Palpation: tightness and discomfort Lt biceps tendon and insertioni of lats Lt   DIAGNOSTIC FINDINGS:  MRI Lt shoulder 08/17/22 - 1. Mild tendinosis of the supraspinatus and infraspinatus tendon without a tear. 2. Mild arthropathy of the acromioclavicular joint with marrow edema on either side of the joint.  PATIENT SURVEYS:  FOTO 54  COGNITION:  Overall  cognitive status: Within functional limits for tasks assessed     SENSATION: WFL  POSTURE: Patient presents with head forward posture with increased thoracic kyphosis; shoulders rounded and elevated; scapulae abducted and rotated along the thoracic spine; head of the humerus anterior in orientation.   UPPER EXTREMITY ROM:   Active ROM Right eval Left eval  Shoulder flexion 161 151  Shoulder extension 54 54  Shoulder abduction 174 153  Shoulder adduction    Shoulder internal rotation Thumb T7 Thumb T4  Shoulder external rotation 107 shoulder 90/elbow 90 90  shoulder 90/elbow 90  Elbow flexion    Elbow extension    Wrist flexion    Wrist extension    Wrist ulnar deviation    Wrist radial deviation    Wrist pronation    Wrist supination    (Blank rows = not tested)  UPPER EXTREMITY MMT:  MMT Right eval Left eval  Shoulder flexion 5/5 4+/5  Shoulder extension 5/5 5/5  Shoulder abduction 5/5 5-/5  Shoulder adduction    Shoulder internal rotation 5/5 5-/5  Shoulder external rotation 5/5 4+/5  Middle trapezius 4/5 4/5  Lower trapezius 4+/5 4/5  Elbow flexion    Elbow extension    Wrist flexion    Wrist extension    Wrist ulnar deviation    Wrist radial deviation    Wrist pronation    Wrist supination    Grip strength (lbs)    (Blank rows = not tested)  PALPATION:  Muscular tightness Lt > Rt pecs; upper trap; leveator; teres; biceps    TODAY'S TREATMENT:  09/14/22: THEREX UBE L4 x 4 min 2 min fwd/2 min back Doorway stretch 3 positions 30 [place] x 2 each Biceps stretch standing 30 sec x 2  Prone arms at side, "W", "I", "T", "Y" x10 reps Standing serratus anterior with foam roll x10  MANUAL THERAPY Skilled assessment and palpation for TPDN Trigger Point Dry-Needling  09/08/22: Treatment instructions: Expect mild to moderate muscle soreness. S/S of pneumothorax if dry needled over a lung field, and to seek immediate medical attention should they occur. Patient  verbalized understanding of these instructions and education.  Patient Consent Given: Yes Education  handout provided: Yes Muscles treated: thoracic paraspinals T3-5 bilat; Lt teres/lats; supraspinatus Electrical stimulation performed: yes Parameters: mAmp x 5 to tolerance Treatment response/outcome: decreased muscular tightness   Taping: kinesotape bilat shoulders for anterior instability 1 strop head of humerus to distal medial scapulae; inhibition of upper trap 1 strip upper trap to medial scapulae/thoracic area     09/08/22: THEREX UBE L4 x 4 min 2 min fwd/2 min back Doorway stretch 3 positions 30 [place] x 2 each Biceps stretch standing 30 sec x 2  Prone arms at side, "W", "I", "T", "Y" 5 to 10 reps Lat supine with PT assist x 3 30-45 sec hold    MANUAL THERAPY Thoracic PA and lateral mobs mid thoracic area patient sitting upper body supported on treatment table - Grade II/III mobs T3 to T5    Skilled assessment and palpation for TPDN Trigger Point Dry-Needling  09/08/22: Treatment instructions: Expect mild to moderate muscle soreness. S/S of pneumothorax if dry needled over a lung field, and to seek immediate medical attention should they occur. Patient verbalized understanding of these instructions and education.  Patient Consent Given: Yes Education handout provided: Yes Muscles treated: thoracic paraspinals T3-5 bilat; Lt teres/lats  Electrical stimulation performed: yes Parameters: mAmp x 5 to tolerance Treatment response/outcome: decreased muscular tightness   Taping: kinesotape bilat shoulders for anterior instability 1 strop head of humerus to distal medial scapulae; inhibition of upper trap 1 strip upper trap to medial scapulae/thoracic area   MODALITIES Moist heat x 10 min at end of session   08/30/22:  Therapeutic exercise:  UBE L6 x 4 min 2 min fwd/2 min back Doorway stretch 3 positions 30 [place] x 2 each  Biceps stretch standing 20 sec x 2 reps  Prone scap  squeeze arms @ side 10 sec x10 Isometric row blue TB 15-20 sec hold x 5 Bow and arrow blue TB x 10 each side 3 sec hold  Isometric shoulder IR blue TB x 10 sec x 10  Isometric shoulder ER blue TB x 10 x 10 sec   Trigger Point Dry-Needling  08/30/22: Treatment instructions: Expect mild to moderate muscle soreness. S/S of pneumothorax if dry needled over a lung field, and to seek immediate medical attention should they occur. Patient verbalized understanding of these instructions and education.  Patient Consent Given: Yes Education handout provided: Yes Muscles treated: Lt thoracic paraspinals: infraspinatus; upper traps rhomboids  Electrical stimulation performed: Yes Parameters: mAmp x 5 min to pt tolerance Treatment response/outcome: decreased muscular tightness   Modalities:  Moist heat anterior shoulder x 10 min     PATIENT EDUCATION: Education details: POC; HEP; postural correction  Person educated: Patient Education method: Explanation, Demonstration, Tactile cues, Verbal cues, and Handouts Education comprehension: verbalized understanding, returned demonstration, verbal cues required, tactile cues required, and needs further education   HOME EXERCISE PROGRAM: Access Code: [email or web address] URL: [email or web address] Date: 08/30/2022 Prepared by: Corlis Leak  Exercises - Seated Cervical Retraction  - 3 x daily - 7 x weekly - 1 sets - 10 reps - Standing Scapular Retraction  - 3 x daily - 7 x weekly - 1 sets - 10 reps - 10 hold - Shoulder External Rotation and Scapular Retraction  - 3 x daily - 7 x weekly - 1 sets - 10 reps -   hold - Shoulder External Rotation in 45 Degrees Abduction  - 2 x daily - 7 x weekly - 1-2 sets - 10 reps - 3 sec  hold - Doorway Pec  Stretch at 60 Degrees Abduction  - 3 x daily - 7 x weekly - 1 sets - 3 reps - Doorway Pec Stretch at 90 Degrees Abduction  - 3 x daily - 7 x weekly - 1 sets - 3 reps - 30 seconds  hold - Doorway Pec Stretch at 120  Degrees Abduction  - 3 x daily - 7 x weekly - 1 sets - 3 reps - 30 second hold  hold - Bicep Stretch at Table  - 2 x daily - 7 x weekly - 1 sets - 3 reps - 30 sec  hold - Chest and Bicep Stretch - Arms Behind Back  - 2 x daily - 7 x weekly - 1 sets - 3 reps - 30 sec  hold - Standing Bicep Stretch at Wall  - 2 x daily - 7 x weekly - 1 sets - 3 reps - 30 sec  hold - Standing Pectoral Release with Ball at Wall  - 2 x daily - 7 x weekly - Standing Infraspinatus/Teres Minor Release with Ball at [employer]  - 2 x daily - 7 x weekly - Supine Chest Stretch on Foam Roll  - 2 x daily - 7 x weekly - 1 sets - 1 reps - 2-5 min  sec  hold - Bicep Stretch at Table  - 2 x daily - 7 x weekly - 1 sets - 3 reps - 20-30 sec  hold - Supine Lower Trunk Rotation  - 1 x daily - 7 x weekly - 1 sets - 3-5 reps - 20-30 sec  hold - Standing Shoulder Row Reactive Isometric  - 1 x daily - 7 x weekly - 1 sets - 10 reps - 30-45 sec  hold - Drawing Bow  - 1 x daily - 7 x weekly - 2-3 sets - 10 reps - 3 sec  hold - Shoulder External Rotation Reactive Isometrics  - 1 x daily - 7 x weekly - 1 sets - 5-10 reps - 10-30 sec  hold - Shoulder Internal Rotation Reactive Isometrics  - 2 x daily - 7 x weekly - 1 sets - 10 reps - 3-5 sec  hold - Prone Scapular Retraction  - 2 x daily - 7 x weekly - 1 sets - 5-10 reps - 3-5 sec  hold  Patient Education - Trigger Point Dry Needling - TENS Unit  ASSESSMENT:  CLINICAL IMPRESSION: 09/14/22: Treatment continued to work on improving thoracic paraspinal and shoulder muscle extensibility. Continued to work on strengthening scapula and posterior shoulder girdle.   Eval: Patient is a 46 y.o. female who was seen today for physical therapy evaluation and treatment for impingement Lt shoulder.    OBJECTIVE IMPAIRMENTS decreased activity tolerance, decreased mobility, decreased ROM, decreased strength, increased fascial restrictions, impaired flexibility, impaired UE functional use, improper body  mechanics, postural dysfunction, and pain.   ACTIVITY LIMITATIONS carrying, lifting, reach over head, and sports activities   PARTICIPATION LIMITATIONS: cleaning, laundry, community activity, and occupation  PERSONAL FACTORS Past/current experiences, Profession, and 1 comorbidity: arthritis are also affecting patient's functional outcome.   REHAB POTENTIAL: Good  CLINICAL DECISION MAKING: Stable/uncomplicated  EVALUATION COMPLEXITY: Low   GOALS: Goals reviewed with patient? Yes  SHORT TERM GOALS: Target date: 09/21/2022    Independent in initial HEP Baseline: Goal status: INITIAL  2.  Increase awareness of posture and alignment with posterior shoulder girdle engaged  Baseline:  Goal status: INITIAL  LONG TERM GOALS: Target date: 10/19/2022    Decrease Lt  shoulder pain by 75-100% allowing patient to return to all normal work and recreational activities  Baseline:  Goal status: INITIAL  2.  Improve posture and alignment with patient to demonstrate upright posture with posterior shoulder girdle engaged - increased strength in middle and lower traps Baseline:  Goal status: INITIAL  3.  Increase Lt shoulder ROM in end range shoulder flexion and scaption by 5-7 degrees with no pain with ROM  Baseline:  Goal status: INITIAL  4.  Increase strength Lt shoulder to 5/5  Baseline:  Goal status: INITIAL  5.  Independent in HEP with return to gym and resistive training  Baseline:  Goal status: INITIAL  6.  Improve functional limitation score to 71 Baseline: 54 Goal status: INITIAL   PLAN: PT FREQUENCY: 2x/week  PT DURATION: 8 weeks  PLANNED INTERVENTIONS: Therapeutic exercises, Therapeutic activity, Neuromuscular re-education, Patient/Family education, Self Care, Joint mobilization, Aquatic Therapy, Dry Needling, Electrical stimulation, Cryotherapy, Moist heat, Taping, Ultrasound, Ionotophoresis 4mg /ml Dexamethasone, Manual therapy, and Re-evaluation  PLAN FOR NEXT  SESSION: Review and progress with exercise program; manual work and/or modalities as indicated. Continue work on posture and alignment, stretching, strengthening, restoring muscular balance in the shoulder girdle working o]posterior shoulder girdle - middle and lower traps with inhibition of upper traps. Consider trial of eccentric biceps work to increased strength in biceps    [facility] April May, PT, MPH  09/14/2022, 8:03 AM

## 2022-09-16 ENCOUNTER — Ambulatory Visit: Payer: 59 | Admitting: Physical Therapy

## 2022-09-16 ENCOUNTER — Encounter: Payer: Self-pay | Admitting: Physical Therapy

## 2022-09-16 DIAGNOSIS — G8929 Other chronic pain: Secondary | ICD-10-CM

## 2022-09-16 DIAGNOSIS — M6281 Muscle weakness (generalized): Secondary | ICD-10-CM

## 2022-09-16 DIAGNOSIS — R293 Abnormal posture: Secondary | ICD-10-CM

## 2022-09-16 DIAGNOSIS — R29898 Other symptoms and signs involving the musculoskeletal system: Secondary | ICD-10-CM

## 2022-09-16 DIAGNOSIS — M25819 Other specified joint disorders, unspecified shoulder: Secondary | ICD-10-CM

## 2022-09-16 DIAGNOSIS — M7542 Impingement syndrome of left shoulder: Secondary | ICD-10-CM | POA: Diagnosis not present

## 2022-09-16 NOTE — Therapy (Signed)
OUTPATIENT PHYSICAL THERAPY SHOULDER TREATMENT   Patient Name: Whitney Turner MRN: [id number] DOB:February 16, 1976, 46 y.o., female Today's Date: 09/16/2022   PT End of Session - 09/16/22 0810     Visit Number 8    Number of Visits 16    Date for PT Re-Evaluation 10/19/22    PT Start Time 0810   late arrival   PT Stop Time 0845    PT Time Calculation (min) 35 min    Activity Tolerance Patient tolerated treatment well    Behavior During Therapy [facility] for tasks assessed/performed               Past Medical History:  Diagnosis Date   Allergy    Heart murmur    Herpes    Past Surgical History:  Procedure Laterality Date   [place]  08/2011   TONSILECTOMY/ADENOIDECTOMY WITH MYRINGOTOMY  2000   Patient Active Problem List   Diagnosis Date Noted   Poor concentration 12/30/2020   Hyperactive behavior 12/30/2020   Impingement syndrome, shoulder, left 11/17/2020   Elevated LDL cholesterol level 12/24/2019   Right lower quadrant pain 12/24/2019   Right upper quadrant pain 12/24/2019   Effusion of right knee 06/25/2019   Hemorrhoids 12/04/2018   CIN I (cervical intraepithelial neoplasia I) 04/03/2018   Lateral epicondylitis of left elbow 02/15/2018   Left hand pain 02/23/2016   History of repair of ACL of Left knee 01/26/2016   Plantar fasciitis, bilateral 12/29/2015   Genital herpes 12/01/2012   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/2012   Allergic rhinitis 12/01/2012   Seasonal allergies 12/01/2012    PCP: Iran Planas, PA-C  REFERRING PROVIDER: Dr Aundria Mems  REFERRING DIAG: Impingement Lt shoulder   THERAPY DIAG:  Shoulder impingement  Chronic left shoulder pain  Muscle weakness (generalized)  Other symptoms and signs involving the musculoskeletal system  Abnormal posture  Rationale for Evaluation and Treatment Rehabilitation  ONSET DATE: 08/22/20  SUBJECTIVE:                                                                                                                                                                                       SUBJECTIVE STATEMENT: 09/16/22: Pt reports doing well. Notes she is able to lift her arms overhead without pain. States some soreness in midback  Eval: Lt shoulder pain for about 2 years with increase in symptoms in June. 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 Now has difficulty with any other upper body weights. Received injection 08/17/22 with some improvement.   PERTINENT HISTORY:  Lateral epicondylitis; knee pain; Lt knee scope  ACL; arthritis   PAIN:  09/16/22: Are you having pain? Yes: NPRS scale: 0 Pain location: Lt shoulder Pain description: intermittent, nagging, increased with activity Aggravating factors: lifting; throwing; batting  Relieving factors: injection; avoiding activity   PRECAUTIONS: None   FALLS:  Has patient fallen in last 6 months? No  LIVING ENVIRONMENT: Lives with: lives with their family Lives in: House/apartment   OCCUPATION: Work for [employer]; sports official; driving; in the gym 3 days/wk - aerobics and weights - avoiding UE resistive exercises   PLOF: Independent  PATIENT GOALS get the shoulder better; get shoulder stronger  OBJECTIVE:    09/06/22: AROM Lt shoulder approximating AROM Rt with some persistent tightness in the upper shoulder.  Palpation: tightness and discomfort Lt biceps tendon and insertioni of lats Lt   DIAGNOSTIC FINDINGS:  MRI Lt shoulder 08/17/22 - 1. Mild tendinosis of the supraspinatus and infraspinatus tendon without a tear. 2. Mild arthropathy of the acromioclavicular joint with marrow edema on either side of the joint.  PATIENT SURVEYS:  FOTO 54  COGNITION:  Overall cognitive status: Within functional limits for tasks assessed     SENSATION: WFL  POSTURE: Patient presents with head forward posture with increased thoracic kyphosis; shoulders rounded and  elevated; scapulae abducted and rotated along the thoracic spine; head of the humerus anterior in orientation.   UPPER EXTREMITY ROM:   Active ROM Right eval Left eval  Shoulder flexion 161 151  Shoulder extension 54 54  Shoulder abduction 174 153  Shoulder adduction    Shoulder internal rotation Thumb T7 Thumb T4  Shoulder external rotation 107 shoulder 90/elbow 90 90  shoulder 90/elbow 90  Elbow flexion    Elbow extension    Wrist flexion    Wrist extension    Wrist ulnar deviation    Wrist radial deviation    Wrist pronation    Wrist supination    (Blank rows = not tested)  UPPER EXTREMITY MMT:  MMT Right eval Left eval  Shoulder flexion 5/5 4+/5  Shoulder extension 5/5 5/5  Shoulder abduction 5/5 5-/5  Shoulder adduction    Shoulder internal rotation 5/5 5-/5  Shoulder external rotation 5/5 4+/5  Middle trapezius 4/5 4/5  Lower trapezius 4+/5 4/5  Elbow flexion    Elbow extension    Wrist flexion    Wrist extension    Wrist ulnar deviation    Wrist radial deviation    Wrist pronation    Wrist supination    Grip strength (lbs)    (Blank rows = not tested)  PALPATION:  Muscular tightness Lt > Rt pecs; upper trap; leveator; teres; biceps    TODAY'S TREATMENT:  09/16/22: THEREX UBE L4 x 4 min 2 min fwd/2 min bwd Doorway stretch 3 positions 30 sec x 2 each Biceps stretch standing 30 sec x 2 Foam roller mid thoracic x1 min [place] on foam roller 2x10 Prone arms at side posterior scapular tilting at 60, 90 and then 120 deg x10 each  MANUAL THERAPY Skilled assessment and palpation for TPDN Trigger Point Dry-Needling  Treatment instructions: Expect mild to moderate muscle soreness. S/S of pneumothorax if dry needled over a lung field, and to seek immediate medical attention should they occur. Patient verbalized understanding of these instructions and education.  Patient Consent Given: Yes Education handout provided: Yes Muscles treated: thoracic  paraspinals T3-5 bilat; Lt teres/lats; subscapularis Electrical stimulation performed: yes Parameters: mAmp x 5 to tolerance Treatment response/outcome: decreased muscular tightness   Taping: kinesotape  bilat shoulders for anterior instability 1 strop head of humerus to distal medial scapulae; inhibition of upper trap 1 strip upper trap to medial scapulae/thoracic area    09/14/22: THEREX UBE L4 x 4 min 2 min fwd/2 min back Doorway stretch 3 positions 30 [place] x 2 each Biceps stretch standing 30 sec x 2  Prone arms at side, "W", "I", "T", "Y" x10 reps Standing serratus anterior with foam roll x10  MANUAL THERAPY Skilled assessment and palpation for TPDN Trigger Point Dry-Needling  09/08/22: Treatment instructions: Expect mild to moderate muscle soreness. S/S of pneumothorax if dry needled over a lung field, and to seek immediate medical attention should they occur. Patient verbalized understanding of these instructions and education.  Patient Consent Given: Yes Education handout provided: Yes Muscles treated: thoracic paraspinals T3-5 bilat; Lt teres/lats; supraspinatus Electrical stimulation performed: yes Parameters: mAmp x 5 to tolerance Treatment response/outcome: decreased muscular tightness   Taping: kinesotape bilat shoulders for anterior instability 1 strop head of humerus to distal medial scapulae; inhibition of upper trap 1 strip upper trap to medial scapulae/thoracic area     09/08/22: THEREX UBE L4 x 4 min 2 min fwd/2 min back Doorway stretch 3 positions 30 [place] x 2 each Biceps stretch standing 30 sec x 2  Prone arms at side, "W", "I", "T", "Y" 5 to 10 reps Lat supine with PT assist x 3 30-45 sec hold    MANUAL THERAPY Thoracic PA and lateral mobs mid thoracic area patient sitting upper body supported on treatment table - Grade II/III mobs T3 to T5    Skilled assessment and palpation for TPDN Trigger Point Dry-Needling  09/08/22: Treatment instructions: Expect  mild to moderate muscle soreness. S/S of pneumothorax if dry needled over a lung field, and to seek immediate medical attention should they occur. Patient verbalized understanding of these instructions and education.  Patient Consent Given: Yes Education handout provided: Yes Muscles treated: thoracic paraspinals T3-5 bilat; Lt teres/lats  Electrical stimulation performed: yes Parameters: mAmp x 5 to tolerance Treatment response/outcome: decreased muscular tightness   Taping: kinesotape bilat shoulders for anterior instability 1 strop head of humerus to distal medial scapulae; inhibition of upper trap 1 strip upper trap to medial scapulae/thoracic area   MODALITIES Moist heat x 10 min at end of session     PATIENT EDUCATION: Education details: POC; HEP; postural correction  Person educated: Patient Education method: Explanation, Demonstration, Tactile cues, Verbal cues, and Handouts Education comprehension: verbalized understanding, returned demonstration, verbal cues required, tactile cues required, and needs further education   HOME EXERCISE PROGRAM: Access Code: [email or web address] URL: [email or web address] Date: 08/30/2022 Prepared by: Gillermo Murdoch  Exercises - Seated Cervical Retraction  - 3 x daily - 7 x weekly - 1 sets - 10 reps - Standing Scapular Retraction  - 3 x daily - 7 x weekly - 1 sets - 10 reps - 10 hold - Shoulder External Rotation and Scapular Retraction  - 3 x daily - 7 x weekly - 1 sets - 10 reps -   hold - Shoulder External Rotation in 45 Degrees Abduction  - 2 x daily - 7 x weekly - 1-2 sets - 10 reps - 3 sec  hold - Doorway Pec Stretch at 60 Degrees Abduction  - 3 x daily - 7 x weekly - 1 sets - 3 reps - Doorway Pec Stretch at 90 Degrees Abduction  - 3 x daily - 7 x weekly - 1 sets - 3 reps - 30 seconds  hold - Doorway Pec Stretch at 120 Degrees Abduction  - 3 x daily - 7 x weekly - 1 sets - 3 reps - 30 second hold  hold - Bicep Stretch at Table  - 2 x  daily - 7 x weekly - 1 sets - 3 reps - 30 sec  hold - Chest and Bicep Stretch - Arms Behind Back  - 2 x daily - 7 x weekly - 1 sets - 3 reps - 30 sec  hold - Standing Bicep Stretch at Wall  - 2 x daily - 7 x weekly - 1 sets - 3 reps - 30 sec  hold - Standing Pectoral Release with Ball at Wall  - 2 x daily - 7 x weekly - Standing Infraspinatus/Teres Minor Release with Ball at [employer]  - 2 x daily - 7 x weekly - Supine Chest Stretch on Foam Roll  - 2 x daily - 7 x weekly - 1 sets - 1 reps - 2-5 min  sec  hold - Bicep Stretch at Table  - 2 x daily - 7 x weekly - 1 sets - 3 reps - 20-30 sec  hold - Supine Lower Trunk Rotation  - 1 x daily - 7 x weekly - 1 sets - 3-5 reps - 20-30 sec  hold - Standing Shoulder Row Reactive Isometric  - 1 x daily - 7 x weekly - 1 sets - 10 reps - 30-45 sec  hold - Drawing Bow  - 1 x daily - 7 x weekly - 2-3 sets - 10 reps - 3 sec  hold - Shoulder External Rotation Reactive Isometrics  - 1 x daily - 7 x weekly - 1 sets - 5-10 reps - 10-30 sec  hold - Shoulder Internal Rotation Reactive Isometrics  - 2 x daily - 7 x weekly - 1 sets - 10 reps - 3-5 sec  hold - Prone Scapular Retraction  - 2 x daily - 7 x weekly - 1 sets - 5-10 reps - 3-5 sec  hold  Patient Education - Trigger Point Dry Needling - TENS Unit  ASSESSMENT:  CLINICAL IMPRESSION: 09/16/22: Treatment continued to work on decreasing anterior scapular tilting. Continued TPDN to improved thoracic paraspinals and   Eval: Patient is a 46 y.o. female who was seen today for physical therapy evaluation and treatment for impingement Lt shoulder.    OBJECTIVE IMPAIRMENTS decreased activity tolerance, decreased mobility, decreased ROM, decreased strength, increased fascial restrictions, impaired flexibility, impaired UE functional use, improper body mechanics, postural dysfunction, and pain.   ACTIVITY LIMITATIONS carrying, lifting, reach over head, and sports activities   PARTICIPATION LIMITATIONS: cleaning,  laundry, community activity, and occupation  PERSONAL FACTORS Past/current experiences, Profession, and 1 comorbidity: arthritis are also affecting patient's functional outcome.   REHAB POTENTIAL: Good  CLINICAL DECISION MAKING: Stable/uncomplicated  EVALUATION COMPLEXITY: Low   GOALS: Goals reviewed with patient? Yes  SHORT TERM GOALS: Target date: 09/21/2022    Independent in initial HEP Baseline: Goal status: INITIAL  2.  Increase awareness of posture and alignment with posterior shoulder girdle engaged  Baseline:  Goal status: INITIAL  LONG TERM GOALS: Target date: 10/19/2022    Decrease Lt shoulder pain by 75-100% allowing patient to return to all normal work and recreational activities  Baseline:  Goal status: INITIAL  2.  Improve posture and alignment with patient to demonstrate upright posture with posterior shoulder girdle engaged - increased strength in middle and lower traps Baseline:  Goal status: INITIAL  3.  Increase Lt shoulder ROM in end range shoulder flexion and scaption by 5-7 degrees with no pain with ROM  Baseline:  Goal status: INITIAL  4.  Increase strength Lt shoulder to 5/5  Baseline:  Goal status: INITIAL  5.  Independent in HEP with return to gym and resistive training  Baseline:  Goal status: INITIAL  6.  Improve functional limitation score to 71 Baseline: 54 Goal status: INITIAL   PLAN: PT FREQUENCY: 2x/week  PT DURATION: 8 weeks  PLANNED INTERVENTIONS: Therapeutic exercises, Therapeutic activity, Neuromuscular re-education, Patient/Family education, Self Care, Joint mobilization, Aquatic Therapy, Dry Needling, Electrical stimulation, Cryotherapy, Moist heat, Taping, Ultrasound, Ionotophoresis 4mg /ml Dexamethasone, Manual therapy, and Re-evaluation  PLAN FOR NEXT SESSION: Review and progress with exercise program; manual work and/or modalities as indicated. Continue work on posture and alignment, stretching, strengthening,  restoring muscular balance in the shoulder girdle working o]posterior shoulder girdle - middle and lower traps with inhibition of upper traps. Consider trial of eccentric biceps work to increased strength in biceps    [facility] April Gordy Levan, PT, MPH  09/16/2022, 9:24 AM

## 2022-09-20 ENCOUNTER — Ambulatory Visit: Payer: 59 | Admitting: Rehabilitative and Restorative Service Providers"

## 2022-09-20 ENCOUNTER — Encounter: Payer: Self-pay | Admitting: Rehabilitative and Restorative Service Providers"

## 2022-09-20 DIAGNOSIS — R29898 Other symptoms and signs involving the musculoskeletal system: Secondary | ICD-10-CM

## 2022-09-20 DIAGNOSIS — M6281 Muscle weakness (generalized): Secondary | ICD-10-CM

## 2022-09-20 DIAGNOSIS — M7542 Impingement syndrome of left shoulder: Secondary | ICD-10-CM | POA: Diagnosis not present

## 2022-09-20 DIAGNOSIS — M25819 Other specified joint disorders, unspecified shoulder: Secondary | ICD-10-CM

## 2022-09-20 DIAGNOSIS — R293 Abnormal posture: Secondary | ICD-10-CM

## 2022-09-20 DIAGNOSIS — G8929 Other chronic pain: Secondary | ICD-10-CM

## 2022-09-20 NOTE — Therapy (Signed)
OUTPATIENT PHYSICAL THERAPY SHOULDER TREATMENT   Patient Name: Whitney Turner MRN: [id number] DOB:May 04, 1976, 46 y.o., female Today's Date: 09/20/2022   PT End of Session - 09/20/22 0802     Visit Number 9    Number of Visits 16    Date for PT Re-Evaluation 10/19/22    PT Start Time 0800    PT Stop Time 0845    PT Time Calculation (min) 45 min    Activity Tolerance Patient tolerated treatment well               Past Medical History:  Diagnosis Date   Allergy    Heart murmur    Herpes    Past Surgical History:  Procedure Laterality Date   HERNIA REPAIR  1982   KNEE SURGERY  08/2011   TONSILECTOMY/ADENOIDECTOMY WITH MYRINGOTOMY  2000   Patient Active Problem List   Diagnosis Date Noted   Poor concentration 12/30/2020   Hyperactive behavior 12/30/2020   Impingement syndrome, shoulder, left 11/17/2020   Elevated LDL cholesterol level 12/24/2019   Right lower quadrant pain 12/24/2019   Right upper quadrant pain 12/24/2019   Effusion of right knee 06/25/2019   Hemorrhoids 12/04/2018   CIN I (cervical intraepithelial neoplasia I) 04/03/2018   Lateral epicondylitis of left elbow 02/15/2018   Left hand pain 02/23/2016   History of repair of ACL of Left knee 01/26/2016   Plantar fasciitis, bilateral 12/29/2015   Genital herpes 12/01/2012   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/2012   Allergic rhinitis 12/01/2012   Seasonal allergies 12/01/2012    PCP: Iran Planas, PA-C  REFERRING PROVIDER: Dr Aundria Mems  REFERRING DIAG: Impingement Lt shoulder   THERAPY DIAG:  Shoulder impingement  Chronic left shoulder pain  Muscle weakness (generalized)  Other symptoms and signs involving the musculoskeletal system  Abnormal posture  Rationale for Evaluation and Treatment Rehabilitation  ONSET DATE: 08/22/20  SUBJECTIVE:                                                                                                                                                                                       SUBJECTIVE STATEMENT: 09/20/22: Pt reports that she is having some increased pain in the Lt shoulder in the past few days. Maybe the shot is wearing off. Hard to do the exercises on the weekend when she is working long days and travelling   Eval: Lt shoulder pain for about 2 years with increase in symptoms in June. 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 Now has difficulty with any other upper body weights. Received injection 08/17/22 with some improvement.   PERTINENT HISTORY:  Lateral epicondylitis; knee pain; Lt knee scope ACL; arthritis   PAIN:  09/16/22: Are you having pain? Yes: NPRS scale: 5/10 Pain location: Lt shoulder Pain description: intermittent, nagging, increased with activity Aggravating factors: lifting; throwing; batting  Relieving factors: injection; avoiding activity   PRECAUTIONS: None   FALLS:  Has patient fallen in last 6 months? 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  Palpation: tightness and discomfort Lt biceps tendon and insertioni of lats Lt   DIAGNOSTIC FINDINGS:  MRI Lt shoulder 08/17/22 - 1. Mild tendinosis of the supraspinatus and infraspinatus tendon without a tear. 2. Mild arthropathy of the acromioclavicular joint with marrow edema on either side of the joint.  PATIENT SURVEYS:  FOTO 54  COGNITION:  Overall cognitive status: Within functional limits for tasks assessed     SENSATION: WFL  POSTURE: Patient presents with head forward posture with increased thoracic kyphosis;  shoulders rounded and elevated; scapulae abducted and rotated along the thoracic spine; head of the humerus anterior in orientation.   UPPER EXTREMITY ROM:   Active ROM Right eval Left eval  Shoulder flexion 161 151  Shoulder extension 54 54  Shoulder abduction 174 153  Shoulder adduction    Shoulder internal rotation Thumb T7 Thumb T4  Shoulder external rotation 107 shoulder 90/elbow 90 90  shoulder 90/elbow 90  Elbow flexion    Elbow extension    Wrist flexion    Wrist extension    Wrist ulnar deviation    Wrist radial deviation    Wrist pronation    Wrist supination    (Blank rows = not tested)  UPPER EXTREMITY MMT:  MMT Right eval Left eval  Shoulder flexion 5/5 4+/5  Shoulder extension 5/5 5/5  Shoulder abduction 5/5 5-/5  Shoulder adduction    Shoulder internal rotation 5/5 5-/5  Shoulder external rotation 5/5 4+/5  Middle trapezius 4/5 4/5  Lower trapezius 4+/5 4/5  Elbow flexion    Elbow extension    Wrist flexion    Wrist extension    Wrist ulnar deviation    Wrist radial deviation    Wrist pronation    Wrist supination    Grip strength (lbs)    (Blank rows = not tested)  PALPATION:  Muscular tightness Lt > Rt pecs; upper trap; leveator; teres; biceps    TODAY'S TREATMENT:  09/20/22:                       THEREX UBE L5 x 4 min 2 min fwd/2 min bwd Doorway stretch 3 positions 30 sec x 2 each Biceps stretch standing 30 sec x 2 Snow angels on foam roller 2x10 Prone arms at side posterior scapular tilting at 60, 90 and then 120 deg x10 each  MANUAL THERAPY Skilled assessment and palpation for TPDN Trigger Point Dry-Needling  Treatment instructions: Expect mild to moderate muscle soreness. S/S of pneumothorax if dry needled over a lung field, and to seek immediate medical attention should they occur. Patient verbalized understanding of these instructions and education.  Patient Consent Given: Yes Education handout provided: Yes Muscles treated:  thoracic paraspinals T3-5  bilat; teres/lats; lt biceps Electrical stimulation performed: yes Parameters: mAmp x 5 to tolerance Treatment response/outcome: decreased muscular tightness   Taping: kinesotape bilat shoulders for anterior instability 1 strop head of humerus to distal medial scapulae; inhibition of upper trap 1 strip upper trap to medial scapulae/thoracic area   09/16/22: THEREX UBE L4 x 4 min 2 min fwd/2 min bwd Doorway stretch 3 positions 30 sec x 2 each Biceps stretch standing 30 sec x 2 Foam roller mid thoracic x1 min Snow angels on foam roller 2x10 Prone arms at side posterior scapular tilting at 60, 90 and then 120 deg x10 each Eccentric biceps curl 4 # x 10 noodle along spine  MANUAL THERAPY Skilled assessment and palpation for TPDN Trigger Point Dry-Needling  Treatment instructions: Expect mild to moderate muscle soreness. S/S of pneumothorax if dry needled over a lung field, and to seek immediate medical attention should they occur. Patient verbalized understanding of these instructions and education.  Patient Consent Given: Yes Education handout provided: Yes Muscles treated: thoracic paraspinals T3-5 bilat; Lt teres/lats; subscapularis Electrical stimulation performed: yes Parameters: mAmp x 5 to tolerance Treatment response/outcome: decreased muscular tightness   Taping: kinesotape bilat shoulders for anterior instability 1 strop head of humerus to distal medial scapulae; inhibition of upper trap 1 strip upper trap to medial scapulae/thoracic area    PATIENT EDUCATION: Education details: POC; HEP; postural correction  Person educated: Patient Education method: Explanation, Demonstration, Tactile cues, Verbal cues, and Handouts Education comprehension: verbalized understanding, returned demonstration, verbal cues required, tactile cues required, and needs further education   HOME EXERCISE PROGRAM: Access Code: [email or web address] URL:  [email or web address] Date: 08/30/2022 Prepared by: Corlis Leak  Exercises - Seated Cervical Retraction  - 3 x daily - 7 x weekly - 1 sets - 10 reps - Standing Scapular Retraction  - 3 x daily - 7 x weekly - 1 sets - 10 reps - 10 hold - Shoulder External Rotation and Scapular Retraction  - 3 x daily - 7 x weekly - 1 sets - 10 reps -   hold - Shoulder External Rotation in 45 Degrees Abduction  - 2 x daily - 7 x weekly - 1-2 sets - 10 reps - 3 sec  hold - Doorway Pec Stretch at 60 Degrees Abduction  - 3 x daily - 7 x weekly - 1 sets - 3 reps - Doorway Pec Stretch at 90 Degrees Abduction  - 3 x daily - 7 x weekly - 1 sets - 3 reps - 30 seconds  hold - Doorway Pec Stretch at 120 Degrees Abduction  - 3 x daily - 7 x weekly - 1 sets - 3 reps - 30 second hold  hold - Bicep Stretch at Table  - 2 x daily - 7 x weekly - 1 sets - 3 reps - 30 sec  hold - Chest and Bicep Stretch - Arms Behind Back  - 2 x daily - 7 x weekly - 1 sets - 3 reps - 30 sec  hold - Standing Bicep Stretch at Wall  - 2 x daily - 7 x weekly - 1 sets - 3 reps - 30 sec  hold - Standing Pectoral Release with Ball at Wall  - 2 x daily - 7 x weekly - Standing Infraspinatus/Teres Minor Release with Ball at [employer]  - 2 x daily - 7 x weekly - Supine Chest Stretch on Foam Roll  - 2 x daily - 7 x weekly - 1 sets - 1 reps -  2-5 min  sec  hold - Bicep Stretch at Table  - 2 x daily - 7 x weekly - 1 sets - 3 reps - 20-30 sec  hold - Supine Lower Trunk Rotation  - 1 x daily - 7 x weekly - 1 sets - 3-5 reps - 20-30 sec  hold - Standing Shoulder Row Reactive Isometric  - 1 x daily - 7 x weekly - 1 sets - 10 reps - 30-45 sec  hold - Drawing Bow  - 1 x daily - 7 x weekly - 2-3 sets - 10 reps - 3 sec  hold - Shoulder External Rotation Reactive Isometrics  - 1 x daily - 7 x weekly - 1 sets - 5-10 reps - 10-30 sec  hold - Shoulder Internal Rotation Reactive Isometrics  - 2 x daily - 7 x weekly - 1 sets - 10 reps - 3-5 sec  hold - Prone  Scapular Retraction  - 2 x daily - 7 x weekly - 1 sets - 5-10 reps - 3-5 sec  hold  Patient Education - Trigger Point Dry Needling - TENS Unit  ASSESSMENT:  CLINICAL IMPRESSION: 09/20/22: Treatment focused on scapular alignment and muscular balance through Lt shoulder girdle; work on decreasing anterior scapular tilting. Continued dry needling, manual work including thoracic mobs to improved thoracic paraspinals and muscular balance  Eval: Patient is a 46 y.o. female who was seen today for physical therapy evaluation and treatment for impingement Lt shoulder.    OBJECTIVE IMPAIRMENTS decreased activity tolerance, decreased mobility, decreased ROM, decreased strength, increased fascial restrictions, impaired flexibility, impaired UE functional use, improper body mechanics, postural dysfunction, and pain.   ACTIVITY LIMITATIONS carrying, lifting, reach over head, and sports activities   PARTICIPATION LIMITATIONS: cleaning, laundry, community activity, and occupation  PERSONAL FACTORS Past/current experiences, Profession, and 1 comorbidity: arthritis are also affecting patient's functional outcome.   REHAB POTENTIAL: Good  CLINICAL DECISION MAKING: Stable/uncomplicated  EVALUATION COMPLEXITY: Low   GOALS: Goals reviewed with patient? Yes  SHORT TERM GOALS: Target date: 09/21/2022    Independent in initial HEP Baseline: Goal status: INITIAL  2.  Increase awareness of posture and alignment with posterior shoulder girdle engaged  Baseline:  Goal status: INITIAL  LONG TERM GOALS: Target date: 10/19/2022    Decrease Lt shoulder pain by 75-100% allowing patient to return to all normal work and recreational activities  Baseline:  Goal status: INITIAL  2.  Improve posture and alignment with patient to demonstrate upright posture with posterior shoulder girdle engaged - increased strength in middle and lower traps Baseline:  Goal status: INITIAL  3.  Increase Lt shoulder ROM  in end range shoulder flexion and scaption by 5-7 degrees with no pain with ROM  Baseline:  Goal status: INITIAL  4.  Increase strength Lt shoulder to 5/5  Baseline:  Goal status: INITIAL  5.  Independent in HEP with return to gym and resistive training  Baseline:  Goal status: INITIAL  6.  Improve functional limitation score to 71 Baseline: 54 Goal status: INITIAL   PLAN: PT FREQUENCY: 2x/week  PT DURATION: 8 weeks  PLANNED INTERVENTIONS: Therapeutic exercises, Therapeutic activity, Neuromuscular re-education, Patient/Family education, Self Care, Joint mobilization, Aquatic Therapy, Dry Needling, Electrical stimulation, Cryotherapy, Moist heat, Taping, Ultrasound, Ionotophoresis 4mg /ml Dexamethasone, Manual therapy, and Re-evaluation  PLAN FOR NEXT SESSION: Review and progress with exercise program; manual work and/or modalities as indicated. Continue work on posture and alignment, stretching, strengthening, restoring muscular balance in the shoulder girdle working  o]posterior shoulder girdle - middle and lower traps with inhibition of upper traps. Consider trial of eccentric biceps work to increased strength in biceps    Hiba Garry Rober Minion, PT, MPH  09/20/2022, 8:51 AM

## 2022-09-28 ENCOUNTER — Ambulatory Visit: Payer: 59 | Attending: Sports Medicine | Admitting: Rehabilitative and Restorative Service Providers"

## 2022-09-28 ENCOUNTER — Encounter: Payer: Self-pay | Admitting: Rehabilitative and Restorative Service Providers"

## 2022-09-28 ENCOUNTER — Ambulatory Visit (INDEPENDENT_AMBULATORY_CARE_PROVIDER_SITE_OTHER): Payer: 59

## 2022-09-28 DIAGNOSIS — R399 Unspecified symptoms and signs involving the genitourinary system: Secondary | ICD-10-CM

## 2022-09-28 DIAGNOSIS — M25819 Other specified joint disorders, unspecified shoulder: Secondary | ICD-10-CM | POA: Insufficient documentation

## 2022-09-28 DIAGNOSIS — M25512 Pain in left shoulder: Secondary | ICD-10-CM | POA: Diagnosis present

## 2022-09-28 DIAGNOSIS — R29898 Other symptoms and signs involving the musculoskeletal system: Secondary | ICD-10-CM | POA: Insufficient documentation

## 2022-09-28 DIAGNOSIS — R293 Abnormal posture: Secondary | ICD-10-CM | POA: Insufficient documentation

## 2022-09-28 DIAGNOSIS — M6281 Muscle weakness (generalized): Secondary | ICD-10-CM | POA: Diagnosis present

## 2022-09-28 DIAGNOSIS — G8929 Other chronic pain: Secondary | ICD-10-CM | POA: Insufficient documentation

## 2022-09-28 NOTE — Progress Notes (Signed)
Pt here to do urine sample for UTI symptoms. Pt given lab orders and sent to the lab.

## 2022-09-28 NOTE — Therapy (Signed)
OUTPATIENT PHYSICAL THERAPY SHOULDER TREATMENT   Patient Name: Whitney Turner MRN: [id number] DOB:18-Dec-1975, 46 y.o., female Today's Date: 09/28/2022   PT End of Session - 09/28/22 1158     Visit Number 10    Number of Visits 16    Date for PT Re-Evaluation 10/19/22    PT Start Time 1150    PT Stop Time [id number]    PT Time Calculation (min) 45 min    Activity Tolerance Patient tolerated treatment well               Past Medical History:  Diagnosis Date   Allergy    Heart murmur    Herpes    Past Surgical History:  Procedure Laterality Date   [place]  08/2011   TONSILECTOMY/ADENOIDECTOMY WITH MYRINGOTOMY  2000   Patient Active Problem List   Diagnosis Date Noted   Poor concentration 12/30/2020   Hyperactive behavior 12/30/2020   Impingement syndrome, shoulder, left 11/17/2020   Elevated LDL cholesterol level 12/24/2019   Right lower quadrant pain 12/24/2019   Right upper quadrant pain 12/24/2019   Effusion of right knee 06/25/2019   Hemorrhoids 12/04/2018   CIN I (cervical intraepithelial neoplasia I) 04/03/2018   Lateral epicondylitis of left elbow 02/15/2018   Left hand pain 02/23/2016   History of repair of ACL of Left knee 01/26/2016   Plantar fasciitis, bilateral 12/29/2015   Genital herpes 12/01/2012   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/2012   Allergic rhinitis 12/01/2012   Seasonal allergies 12/01/2012    PCP: Iran Planas, PA-C  REFERRING PROVIDER: Dr Aundria Mems  REFERRING DIAG: Impingement Lt shoulder   THERAPY DIAG:  Shoulder impingement  Chronic left shoulder pain  Muscle weakness (generalized)  Other symptoms and signs involving the musculoskeletal system  Abnormal posture  Rationale for Evaluation and Treatment Rehabilitation  ONSET DATE: 08/22/20  SUBJECTIVE:                                                                                                                                                                                       SUBJECTIVE STATEMENT: 09/28/22: Pt reports that she is having some intermittent pain or discomfort in the Lt shoulder but it is much better than it was before therapy and injection. She still finds it difficult to do the exercises when she is working on the weekends and has long days and travelling. She will try to get into a routine of exercises now that her work schedule is slowing down some.    Eval: Lt shoulder pain for about 2 years with increase in symptoms in June. She plays and coaches softball and  notices that she can not throw or hit the softball. Now has difficulty with any other upper body weights. Received injection 08/17/22 with some improvement.   PERTINENT HISTORY:  Lateral epicondylitis; knee pain; Lt knee scope ACL; arthritis   PAIN:  09/16/22: Are you having pain? Yes: NPRS scale: 2-3/10 Pain location: Lt shoulder Pain description: intermittent, nagging, increased with activity Aggravating factors: lifting; throwing; batting  Relieving factors: injection; avoiding activity   PRECAUTIONS: None   FALLS:  Has patient fallen in last 6 months? No  LIVING ENVIRONMENT: Lives with: lives with their family Lives in: House/apartment   OCCUPATION: Work for [employer]; sports official; driving; in the gym 3 days/wk - aerobics and weights - avoiding UE resistive exercises   PLOF: Independent  PATIENT GOALS get the shoulder better; get shoulder stronger  OBJECTIVE:    09/06/22: AROM Lt shoulder approximating AROM Rt with some persistent tightness in the upper shoulder.  Palpation: tightness and discomfort Lt biceps tendon and insertioni of lats Lt   DIAGNOSTIC FINDINGS:  MRI Lt shoulder 08/17/22 - 1. Mild tendinosis of the supraspinatus and infraspinatus tendon without a tear. 2. Mild arthropathy of the acromioclavicular joint with marrow edema on either side of the joint.  PATIENT SURVEYS:  FOTO 54  COGNITION:  Overall cognitive  status: Within functional limits for tasks assessed     SENSATION: WFL  POSTURE: Patient presents with head forward posture with increased thoracic kyphosis; shoulders rounded and elevated; scapulae abducted and rotated along the thoracic spine; head of the humerus anterior in orientation.   UPPER EXTREMITY ROM:   Active ROM Right eval Left eval  Shoulder flexion 161 151  Shoulder extension 54 54  Shoulder abduction 174 153  Shoulder adduction    Shoulder internal rotation Thumb T7 Thumb T4  Shoulder external rotation 107 shoulder 90/elbow 90 90  shoulder 90/elbow 90  Elbow flexion    Elbow extension    Wrist flexion    Wrist extension    Wrist ulnar deviation    Wrist radial deviation    Wrist pronation    Wrist supination    (Blank rows = not tested)  UPPER EXTREMITY MMT:  MMT Right eval Left eval  Shoulder flexion 5/5 4+/5  Shoulder extension 5/5 5/5  Shoulder abduction 5/5 5-/5  Shoulder adduction    Shoulder internal rotation 5/5 5-/5  Shoulder external rotation 5/5 4+/5  Middle trapezius 4/5 4/5  Lower trapezius 4+/5 4/5  Elbow flexion    Elbow extension    Wrist flexion    Wrist extension    Wrist ulnar deviation    Wrist radial deviation    Wrist pronation    Wrist supination    Grip strength (lbs)    (Blank rows = not tested)  PALPATION:  Muscular tightness Lt > Rt pecs; upper trap; leveator; teres; biceps    TODAY'S TREATMENT:  09/28/22:                       THEREX UBE L6 x 4 min 2 min fwd/2 min bwd Doorway stretch 3 positions 30 sec x 2 each Biceps stretch standing 30 sec x 2 Prone series working on posterior shoulder girdle strengthening and stabilization - arms at side, W, goals post, T, I, Y, superman   MANUAL THERAPY Skilled assessment and palpation for TPDN Trigger Point Dry-Needling  Treatment instructions: Expect mild to moderate muscle soreness. S/S of pneumothorax if dry needled over a lung field, and to  seek immediate medical  attention should they occur. Patient verbalized understanding of these instructions and education.  Patient Consent Given: Yes Education handout provided: Yes Muscles treated: thoracic paraspinals T3-5 bilat; teres/lats; lt biceps;  Electrical stimulation performed: no Parameters: mAmp x 5 to tolerance Treatment response/outcome: decreased muscular tightness   Taping: kinesotape bilat shoulders for anterior instability 1 strip head of humerus to distal medial scapulae; inhibition of upper trap 1 strip upper trap to medial scapulae/thoracic area     09/20/22:                       THEREX UBE L5 x 4 min 2 min fwd/2 min bwd Doorway stretch 3 positions 30 sec x 2 each Biceps stretch standing 30 sec x 2 [place] angels on foam roller 2x10 Prone arms at side posterior scapular tilting at 60, 90 and then 120 deg x10 each  MANUAL THERAPY Skilled assessment and palpation for TPDN Trigger Point Dry-Needling  Treatment instructions: Expect mild to moderate muscle soreness. S/S of pneumothorax if dry needled over a lung field, and to seek immediate medical attention should they occur. Patient verbalized understanding of these instructions and education.  Patient Consent Given: Yes Education handout provided: Yes Muscles treated: thoracic paraspinals T3-5 bilat; teres/lats; lt biceps Electrical stimulation performed: yes Parameters: mAmp x 5 to tolerance Treatment response/outcome: decreased muscular tightness   Taping: kinesotape bilat shoulders for anterior instability 1 strip head of humerus to distal medial scapulae; inhibition of upper trap 1 strip upper trap to medial scapulae/thoracic area     PATIENT EDUCATION: Education details: POC; HEP; postural correction  Person educated: Patient Education method: Explanation, Demonstration, Tactile cues, Verbal cues, and Handouts Education comprehension: verbalized understanding, returned demonstration, verbal cues required, tactile cues  required, and needs further education   HOME EXERCISE PROGRAM: Access Code: [email or web address] URL: [email or web address] Date: 08/30/2022 Prepared by: Gillermo Murdoch  Exercises - Seated Cervical Retraction  - 3 x daily - 7 x weekly - 1 sets - 10 reps - Standing Scapular Retraction  - 3 x daily - 7 x weekly - 1 sets - 10 reps - 10 hold - Shoulder External Rotation and Scapular Retraction  - 3 x daily - 7 x weekly - 1 sets - 10 reps -   hold - Shoulder External Rotation in 45 Degrees Abduction  - 2 x daily - 7 x weekly - 1-2 sets - 10 reps - 3 sec  hold - Doorway Pec Stretch at 60 Degrees Abduction  - 3 x daily - 7 x weekly - 1 sets - 3 reps - Doorway Pec Stretch at 90 Degrees Abduction  - 3 x daily - 7 x weekly - 1 sets - 3 reps - 30 seconds  hold - Doorway Pec Stretch at 120 Degrees Abduction  - 3 x daily - 7 x weekly - 1 sets - 3 reps - 30 second hold  hold - Bicep Stretch at Table  - 2 x daily - 7 x weekly - 1 sets - 3 reps - 30 sec  hold - Chest and Bicep Stretch - Arms Behind Back  - 2 x daily - 7 x weekly - 1 sets - 3 reps - 30 sec  hold - Standing Bicep Stretch at Wall  - 2 x daily - 7 x weekly - 1 sets - 3 reps - 30 sec  hold - Standing Pectoral Release with Ball at [place]  - 2 x daily - 7 x weekly -  Standing Infraspinatus/Teres Minor Release with Ball at [employer]  - 2 x daily - 7 x weekly - Supine Chest Stretch on Foam Roll  - 2 x daily - 7 x weekly - 1 sets - 1 reps - 2-5 min  sec  hold - Bicep Stretch at Table  - 2 x daily - 7 x weekly - 1 sets - 3 reps - 20-30 sec  hold - Supine Lower Trunk Rotation  - 1 x daily - 7 x weekly - 1 sets - 3-5 reps - 20-30 sec  hold - Standing Shoulder Row Reactive Isometric  - 1 x daily - 7 x weekly - 1 sets - 10 reps - 30-45 sec  hold - Drawing Bow  - 1 x daily - 7 x weekly - 2-3 sets - 10 reps - 3 sec  hold - Shoulder External Rotation Reactive Isometrics  - 1 x daily - 7 x weekly - 1 sets - 5-10 reps - 10-30 sec  hold - Shoulder Internal Rotation  Reactive Isometrics  - 2 x daily - 7 x weekly - 1 sets - 10 reps - 3-5 sec  hold - Prone Scapular Retraction  - 2 x daily - 7 x weekly - 1 sets - 5-10 reps - 3-5 sec  hold  Patient Education - Trigger Point Dry Needling - TENS Unit  ASSESSMENT:  CLINICAL IMPRESSION: 09/28/22: Continued work on scapular alignment and muscular balance through Lt shoulder girdle; addressing posterior shoulder girdle strength. Continued dry needling, manual work including thoracic mobs to improved thoracic paraspinals and muscular balance  Eval: Patient is a 46 y.o. female who was seen today for physical therapy evaluation and treatment for impingement Lt shoulder.    OBJECTIVE IMPAIRMENTS decreased activity tolerance, decreased mobility, decreased ROM, decreased strength, increased fascial restrictions, impaired flexibility, impaired UE functional use, improper body mechanics, postural dysfunction, and pain.   ACTIVITY LIMITATIONS carrying, lifting, reach over head, and sports activities   PARTICIPATION LIMITATIONS: cleaning, laundry, community activity, and occupation  PERSONAL FACTORS Past/current experiences, Profession, and 1 comorbidity: arthritis are also affecting patient's functional outcome.   REHAB POTENTIAL: Good  CLINICAL DECISION MAKING: Stable/uncomplicated  EVALUATION COMPLEXITY: Low   GOALS: Goals reviewed with patient? Yes  SHORT TERM GOALS: Target date: 09/21/2022    Independent in initial HEP Baseline: Goal status: INITIAL  2.  Increase awareness of posture and alignment with posterior shoulder girdle engaged  Baseline:  Goal status: INITIAL  LONG TERM GOALS: Target date: 10/19/2022    Decrease Lt shoulder pain by 75-100% allowing patient to return to all normal work and recreational activities  Baseline:  Goal status: INITIAL  2.  Improve posture and alignment with patient to demonstrate upright posture with posterior shoulder girdle engaged - increased strength in  middle and lower traps Baseline:  Goal status: INITIAL  3.  Increase Lt shoulder ROM in end range shoulder flexion and scaption by 5-7 degrees with no pain with ROM  Baseline:  Goal status: INITIAL  4.  Increase strength Lt shoulder to 5/5  Baseline:  Goal status: INITIAL  5.  Independent in HEP with return to gym and resistive training  Baseline:  Goal status: INITIAL  6.  Improve functional limitation score to 71 Baseline: 54 Goal status: INITIAL   PLAN: PT FREQUENCY: 2x/week  PT DURATION: 8 weeks  PLANNED INTERVENTIONS: Therapeutic exercises, Therapeutic activity, Neuromuscular re-education, Patient/Family education, Self Care, Joint mobilization, Aquatic Therapy, Dry Needling, Electrical stimulation, Cryotherapy, Moist heat, Taping, Ultrasound, Ionotophoresis  4mg /ml Dexamethasone, Manual therapy, and Re-evaluation  PLAN FOR NEXT SESSION: Review and progress with exercise program; manual work and/or modalities as indicated. Continue work on posture and alignment, stretching, strengthening, restoring muscular balance in the shoulder girdle working o]posterior shoulder girdle - middle and lower traps with inhibition of upper traps. Consider trial of eccentric biceps work to increased strength in biceps    Morgan, PT, MPH  09/28/2022, 11:59 AM

## 2022-09-29 ENCOUNTER — Encounter: Payer: 59 | Admitting: Rehabilitative and Restorative Service Providers"

## 2022-09-30 ENCOUNTER — Encounter: Payer: Self-pay | Admitting: Rehabilitative and Restorative Service Providers"

## 2022-09-30 ENCOUNTER — Ambulatory Visit: Payer: 59 | Admitting: Rehabilitative and Restorative Service Providers"

## 2022-09-30 DIAGNOSIS — M25819 Other specified joint disorders, unspecified shoulder: Secondary | ICD-10-CM | POA: Diagnosis not present

## 2022-09-30 DIAGNOSIS — G8929 Other chronic pain: Secondary | ICD-10-CM

## 2022-09-30 DIAGNOSIS — R293 Abnormal posture: Secondary | ICD-10-CM

## 2022-09-30 DIAGNOSIS — R29898 Other symptoms and signs involving the musculoskeletal system: Secondary | ICD-10-CM

## 2022-09-30 DIAGNOSIS — M6281 Muscle weakness (generalized): Secondary | ICD-10-CM

## 2022-09-30 LAB — URINE CULTURE
MICRO NUMBER:: 14161497
Result:: NO GROWTH
SPECIMEN QUALITY:: ADEQUATE

## 2022-09-30 NOTE — Therapy (Signed)
OUTPATIENT PHYSICAL THERAPY SHOULDER TREATMENT   Patient Name: Whitney Turner MRN: [id number] DOB:21-Nov-1976, 46 y.o., female Today's Date: 09/30/2022   PT End of Session - 09/30/22 0807     Visit Number 11    Number of Visits 16    Date for PT Re-Evaluation 10/19/22    PT Start Time 0803    PT Stop Time 0845    PT Time Calculation (min) 42 min               Past Medical History:  Diagnosis Date   Allergy    Heart murmur    Herpes    Past Surgical History:  Procedure Laterality Date   HERNIA REPAIR  1982   KNEE SURGERY  08/2011   TONSILECTOMY/ADENOIDECTOMY WITH MYRINGOTOMY  2000   Patient Active Problem List   Diagnosis Date Noted   Poor concentration 12/30/2020   Hyperactive behavior 12/30/2020   Impingement syndrome, shoulder, left 11/17/2020   Elevated LDL cholesterol level 12/24/2019   Right lower quadrant pain 12/24/2019   Right upper quadrant pain 12/24/2019   Effusion of right knee 06/25/2019   Hemorrhoids 12/04/2018   CIN I (cervical intraepithelial neoplasia I) 04/03/2018   Lateral epicondylitis of left elbow 02/15/2018   Left hand pain 02/23/2016   History of repair of ACL of Left knee 01/26/2016   Plantar fasciitis, bilateral 12/29/2015   Genital herpes 12/01/2012   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/2012   Allergic rhinitis 12/01/2012   Seasonal allergies 12/01/2012    PCP: Iran Planas, PA-C  REFERRING PROVIDER: Dr Aundria Mems  REFERRING DIAG: Impingement Lt shoulder   THERAPY DIAG:  Shoulder impingement  Chronic left shoulder pain  Muscle weakness (generalized)  Other symptoms and signs involving the musculoskeletal system  Abnormal posture  Rationale for Evaluation and Treatment Rehabilitation  ONSET DATE: 08/22/20  SUBJECTIVE:                                                                                                                                                                                      SUBJECTIVE  STATEMENT: 09/30/22: Patient reports that she has the nagging pain in the area of the pec on Lt. Pt reports that she is having some intermittent pain or discomfort in the Lt shoulder but it is much better than it was before therapy and injection. She still feels discomfort with certain movements including stretching UE's behind back. Unable to do pushups or planks   Eval: Lt shoulder pain for about 2 years with increase in symptoms in June. 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 Now has difficulty with any other upper  body weights. Received injection 08/17/22 with some improvement.   PERTINENT HISTORY:  Lateral epicondylitis; knee pain; Lt knee scope ACL; arthritis   PAIN:  09/30/22: Are you having pain? Yes: NPRS scale: 3-4/10 Pain location: Lt shoulder Pain description: intermittent, nagging, increased with activity Aggravating factors: lifting; throwing; batting  Relieving factors: injection; avoiding activity   PRECAUTIONS: None   FALLS:  Has patient fallen in last 6 months? No  LIVING ENVIRONMENT: Lives with: lives with their family Lives in: House/apartment   OCCUPATION: Work for [employer]; sports official; driving; in the gym 3 days/wk - aerobics and weights - avoiding UE resistive exercises   PLOF: Independent  PATIENT GOALS get the shoulder better; get shoulder stronger  OBJECTIVE:    09/06/22: AROM Lt shoulder approximating AROM Rt with some persistent tightness in the upper shoulder.  Palpation: tightness and discomfort Lt biceps tendon and insertioni of lats Lt   DIAGNOSTIC FINDINGS:  MRI Lt shoulder 08/17/22 - 1. Mild tendinosis of the supraspinatus and infraspinatus tendon without a tear. 2. Mild arthropathy of the acromioclavicular joint with marrow edema on either side of the joint.  PATIENT SURVEYS:  FOTO 54  COGNITION:  Overall cognitive status: Within functional limits for tasks  assessed     SENSATION: WFL  POSTURE: Patient presents with head forward posture with increased thoracic kyphosis; shoulders rounded and elevated; scapulae abducted and rotated along the thoracic spine; head of the humerus anterior in orientation.   UPPER EXTREMITY ROM:   Active ROM Right eval Left eval  Shoulder flexion 161 151  Shoulder extension 54 54  Shoulder abduction 174 153  Shoulder adduction    Shoulder internal rotation Thumb T7 Thumb T4  Shoulder external rotation 107 shoulder 90/elbow 90 90  shoulder 90/elbow 90  Elbow flexion    Elbow extension    Wrist flexion    Wrist extension    Wrist ulnar deviation    Wrist radial deviation    Wrist pronation    Wrist supination    (Blank rows = not tested)  UPPER EXTREMITY MMT:  MMT Right eval Left eval  Shoulder flexion 5/5 4+/5  Shoulder extension 5/5 5/5  Shoulder abduction 5/5 5-/5  Shoulder adduction    Shoulder internal rotation 5/5 5-/5  Shoulder external rotation 5/5 4+/5  Middle trapezius 4/5 4/5  Lower trapezius 4+/5 4/5  Elbow flexion    Elbow extension    Wrist flexion    Wrist extension    Wrist ulnar deviation    Wrist radial deviation    Wrist pronation    Wrist supination    Grip strength (lbs)    (Blank rows = not tested)  PALPATION:  Muscular tightness Lt > Rt pecs; upper trap; leveator; teres; biceps    TODAY'S TREATMENT:  09/30/22:                         THEREX UBE L6 x 4 min 2 min fwd/2 min bwd Doorway stretch 3 positions 30 sec x 2 each (Added ball at wrist to increase pec stretch) Biceps stretch standing 30 sec x 2 Prone series working on posterior shoulder girdle strengthening and stabilization - arms at side, W, goals post, T, I, Y, superman   MANUAL THERAPY Skilled assessment and palpation for TPDN Trigger Point Dry-Needling  Treatment instructions: Expect mild to moderate muscle soreness. S/S of pneumothorax if dry needled over a lung field, and to seek immediate  medical attention should they  occur. Patient verbalized understanding of these instructions and education.  Patient Consent Given: Yes Education handout provided: Yes Muscles treated: thoracic paraspinals pecs; biceps  Electrical stimulation performed: no Parameters: mAmp x 5 to tolerance Treatment response/outcome: decreased muscular tightness   Taping: kinesotape bilat shoulders for anterior instability 1 strip head of humerus to distal medial scapulae; inhibition of upper trap 1 strip upper trap to medial scapulae/thoracic area       THEREX 09/28/22: UBE L6 x 4 min 2 min fwd/2 min bwd Doorway stretch 3 positions 30 sec x 2 each Biceps stretch standing 30 sec x 2 Prone series working on posterior shoulder girdle strengthening and stabilization - arms at side, W, goals post, T, I, Y, superman   MANUAL THERAPY Skilled assessment and palpation for TPDN Trigger Point Dry-Needling  Treatment instructions: Expect mild to moderate muscle soreness. S/S of pneumothorax if dry needled over a lung field, and to seek immediate medical attention should they occur. Patient verbalized understanding of these instructions and education.  Patient Consent Given: Yes Education handout provided: Yes Muscles treated: thoracic paraspinals T3-5 bilat; teres/lats; lt biceps;  Electrical stimulation performed: no Parameters: mAmp x 5 to tolerance Treatment response/outcome: decreased muscular tightness   Taping: kinesotape bilat shoulders for anterior instability 1 strip head of humerus to distal medial scapulae; inhibition of upper trap 1 strip upper trap to medial scapulae/thoracic area     09/20/22:                       THEREX UBE L5 x 4 min 2 min fwd/2 min bwd Doorway stretch 3 positions 30 sec x 2 each Biceps stretch standing 30 sec x 2 [place] angels on foam roller 2x10 Prone arms at side posterior scapular tilting at 60, 90 and then 120 deg x10 each  MANUAL THERAPY Skilled assessment and  palpation for TPDN Trigger Point Dry-Needling  Treatment instructions: Expect mild to moderate muscle soreness. S/S of pneumothorax if dry needled over a lung field, and to seek immediate medical attention should they occur. Patient verbalized understanding of these instructions and education.  Patient Consent Given: Yes Education handout provided: Yes Muscles treated: thoracic paraspinals T3-5 bilat; teres/lats; lt biceps Electrical stimulation performed: yes Parameters: mAmp x 5 to tolerance Treatment response/outcome: decreased muscular tightness   Taping: kinesotape bilat shoulders for anterior instability 1 strip head of humerus to distal medial scapulae; inhibition of upper trap 1 strip upper trap to medial scapulae/thoracic area     PATIENT EDUCATION: Education details: POC; HEP; postural correction  Person educated: Patient Education method: Explanation, Demonstration, Tactile cues, Verbal cues, and Handouts Education comprehension: verbalized understanding, returned demonstration, verbal cues required, tactile cues required, and needs further education   HOME EXERCISE PROGRAM: Access Code: [email or web address] URL: [email or web address] Date: 08/30/2022 Prepared by: Gillermo Murdoch  Exercises - Seated Cervical Retraction  - 3 x daily - 7 x weekly - 1 sets - 10 reps - Standing Scapular Retraction  - 3 x daily - 7 x weekly - 1 sets - 10 reps - 10 hold - Shoulder External Rotation and Scapular Retraction  - 3 x daily - 7 x weekly - 1 sets - 10 reps -   hold - Shoulder External Rotation in 45 Degrees Abduction  - 2 x daily - 7 x weekly - 1-2 sets - 10 reps - 3 sec  hold - Doorway Pec Stretch at 60 Degrees Abduction  - 3 x daily - 7 x weekly - 1  sets - 3 reps - Doorway Pec Stretch at 90 Degrees Abduction  - 3 x daily - 7 x weekly - 1 sets - 3 reps - 30 seconds  hold - Doorway Pec Stretch at 120 Degrees Abduction  - 3 x daily - 7 x weekly - 1 sets - 3 reps - 30 second hold  hold -  Bicep Stretch at Table  - 2 x daily - 7 x weekly - 1 sets - 3 reps - 30 sec  hold - Chest and Bicep Stretch - Arms Behind Back  - 2 x daily - 7 x weekly - 1 sets - 3 reps - 30 sec  hold - Standing Bicep Stretch at Wall  - 2 x daily - 7 x weekly - 1 sets - 3 reps - 30 sec  hold - Standing Pectoral Release with Ball at Wall  - 2 x daily - 7 x weekly - Standing Infraspinatus/Teres Minor Release with Ball at [employer]  - 2 x daily - 7 x weekly - Supine Chest Stretch on Foam Roll  - 2 x daily - 7 x weekly - 1 sets - 1 reps - 2-5 min  sec  hold - Bicep Stretch at Table  - 2 x daily - 7 x weekly - 1 sets - 3 reps - 20-30 sec  hold - Supine Lower Trunk Rotation  - 1 x daily - 7 x weekly - 1 sets - 3-5 reps - 20-30 sec  hold - Standing Shoulder Row Reactive Isometric  - 1 x daily - 7 x weekly - 1 sets - 10 reps - 30-45 sec  hold - Drawing Bow  - 1 x daily - 7 x weekly - 2-3 sets - 10 reps - 3 sec  hold - Shoulder External Rotation Reactive Isometrics  - 1 x daily - 7 x weekly - 1 sets - 5-10 reps - 10-30 sec  hold - Shoulder Internal Rotation Reactive Isometrics  - 2 x daily - 7 x weekly - 1 sets - 10 reps - 3-5 sec  hold - Prone Scapular Retraction  - 2 x daily - 7 x weekly - 1 sets - 5-10 reps - 3-5 sec  hold  Patient Education - Trigger Point Dry Needling - TENS Unit  ASSESSMENT:  CLINICAL IMPRESSION: 09/30/22: Continued work on scapular alignment and muscular balance through Lt shoulder girdle; addressing posterior shoulder girdle strength. Continued dry needling, manual work including thoracic mobs to improved thoracic paraspinals and muscular balance  Eval: Patient is a 46 y.o. female who was seen today for physical therapy evaluation and treatment for impingement Lt shoulder.    OBJECTIVE IMPAIRMENTS decreased activity tolerance, decreased mobility, decreased ROM, decreased strength, increased fascial restrictions, impaired flexibility, impaired UE functional use, improper body mechanics, postural  dysfunction, and pain.   ACTIVITY LIMITATIONS carrying, lifting, reach over head, and sports activities   PARTICIPATION LIMITATIONS: cleaning, laundry, community activity, and occupation  PERSONAL FACTORS Past/current experiences, Profession, and 1 comorbidity: arthritis are also affecting patient's functional outcome.   REHAB POTENTIAL: Good  CLINICAL DECISION MAKING: Stable/uncomplicated  EVALUATION COMPLEXITY: Low   GOALS: Goals reviewed with patient? Yes  SHORT TERM GOALS: Target date: 09/21/2022    Independent in initial HEP Baseline: Goal status: INITIAL  2.  Increase awareness of posture and alignment with posterior shoulder girdle engaged  Baseline:  Goal status: INITIAL  LONG TERM GOALS: Target date: 10/19/2022    Decrease Lt shoulder pain by 75-100% allowing patient to  return to all normal work and recreational activities  Baseline:  Goal status: INITIAL  2.  Improve posture and alignment with patient to demonstrate upright posture with posterior shoulder girdle engaged - increased strength in middle and lower traps Baseline:  Goal status: INITIAL  3.  Increase Lt shoulder ROM in end range shoulder flexion and scaption by 5-7 degrees with no pain with ROM  Baseline:  Goal status: INITIAL  4.  Increase strength Lt shoulder to 5/5  Baseline:  Goal status: INITIAL  5.  Independent in HEP with return to gym and resistive training  Baseline:  Goal status: INITIAL  6.  Improve functional limitation score to 71 Baseline: 54 Goal status: INITIAL   PLAN: PT FREQUENCY: 2x/week  PT DURATION: 8 weeks  PLANNED INTERVENTIONS: Therapeutic exercises, Therapeutic activity, Neuromuscular re-education, Patient/Family education, Self Care, Joint mobilization, Aquatic Therapy, Dry Needling, Electrical stimulation, Cryotherapy, Moist heat, Taping, Ultrasound, Ionotophoresis 4mg /ml Dexamethasone, Manual therapy, and Re-evaluation  PLAN FOR NEXT SESSION: Review and  progress with exercise program; manual work and/or modalities as indicated. Continue work on posture and alignment, stretching, strengthening, restoring muscular balance in the shoulder girdle working o]posterior shoulder girdle - middle and lower traps with inhibition of upper traps. Consider trial of eccentric biceps work to increased strength in biceps    [place], PT, MPH  09/30/2022, 8:07 AM

## 2022-10-05 ENCOUNTER — Ambulatory Visit (INDEPENDENT_AMBULATORY_CARE_PROVIDER_SITE_OTHER): Payer: 59 | Admitting: Obstetrics and Gynecology

## 2022-10-05 ENCOUNTER — Encounter: Payer: Self-pay | Admitting: Obstetrics and Gynecology

## 2022-10-05 ENCOUNTER — Other Ambulatory Visit (HOSPITAL_COMMUNITY)
Admission: RE | Admit: 2022-10-05 | Discharge: 2022-10-05 | Disposition: A | Discharge status: Home / Self Care | Payer: 59 | Source: Ambulatory Visit | Attending: Obstetrics and Gynecology | Admitting: Obstetrics and Gynecology

## 2022-10-05 VITALS — BP 121/73 | HR 73 | Ht 61.0 in | Wt 128.0 lb

## 2022-10-05 DIAGNOSIS — Z113 Encounter for screening for infections with a predominantly sexual mode of transmission: Secondary | ICD-10-CM

## 2022-10-05 NOTE — Progress Notes (Signed)
    GYNECOLOGY ANNUAL PREVENTATIVE CARE ENCOUNTER NOTE  History:     Whitney Turner is a 46 y.o. [phone] female here for STI testing. Concerned about yeast. Possibly STI concern given that her husband travels a lot. Reports itching and uncomfortable in vaginal area. No odor.    Gynecologic History No LMP recorded. Contraception: OCP (estrogen/progesterone)   Obstetric History OB History  Gravida Para Term Preterm AB Living  5 3 3   2 3   SAB IAB Ectopic Multiple Live Births  2            # Outcome Date GA Lbr Len/2nd Weight Sex Delivery Anes PTL Lv  5 SAB           4 SAB           3 Term           2 Term           1 Term             Past Medical History:  Diagnosis Date   Allergy    Heart murmur    Herpes     Past Surgical History:  Procedure Laterality Date   HERNIA REPAIR  1982   KNEE SURGERY  08/2011   TONSILECTOMY/ADENOIDECTOMY WITH MYRINGOTOMY  2000    Current Outpatient Medications on File Prior to Visit  Medication Sig Dispense Refill   cetirizine (ZYRTEC) 10 MG tablet Take 10 mg by mouth daily.     meloxicam (MOBIC) 15 MG tablet 1 tab p.o. daily with a meal for 2 weeks then daily as needed. 30 tablet 3   norgestimate-ethinyl estradiol (ORTHO-CYCLEN) 0.25-35 MG-MCG tablet Take 1 tablet by mouth daily. (Patient not taking: Reported on 10/05/2022) 90 tablet 3   No current facility-administered medications on file prior to visit.    No Known Allergies  Social History:  reports that she has never smoked. She has never used smokeless tobacco. She reports current alcohol use. She reports that she does not use drugs.  Family History  Problem Relation Age of Onset   Hypertension Father    Hyperlipidemia Father    Cancer Maternal Grandmother        breast cancer   Cancer Maternal Grandfather        lung cancer   Alzheimer's disease Paternal Grandmother    Cancer Paternal Grandfather        lung cancer    The following portions of the patient's history  were reviewed and updated as appropriate: allergies, current medications, past family history, past medical history, past social history, past surgical history and problem list.  Review of Systems Pertinent items noted in HPI and remainder of comprehensive ROS otherwise negative.  Physical Exam:  BP 121/73   Pulse 73   Ht 5\' 1"  (1.549 m)   Wt 128 lb (58.1 kg)   BMI 24.19 kg/m  CONSTITUTIONAL: Well-developed, well-nourished female in no acute distress.  HENT:  Normocephalic EYES: Conjunctivae and EOM are normal.  SKIN: Skin is warm and dry.  MUSCULOSKELETAL: Normal range of motion.  NEUROLOGIC: Alert and oriented to person GU: red, erythematous rash noted around labia folds. Wet prep collected.    Assessment and Plan:   1. Routine screening for STI (sexually transmitted infection)  - Cervicovaginal ancillary only( [place])   Franceska Strahm, 10/07/2022, NP [facility]

## 2022-10-05 NOTE — Progress Notes (Signed)
Pt declines blood work- [facility] only

## 2022-10-06 ENCOUNTER — Encounter: Payer: Self-pay | Admitting: Rehabilitative and Restorative Service Providers"

## 2022-10-06 ENCOUNTER — Ambulatory Visit: Payer: 59 | Admitting: Rehabilitative and Restorative Service Providers"

## 2022-10-06 DIAGNOSIS — R29898 Other symptoms and signs involving the musculoskeletal system: Secondary | ICD-10-CM

## 2022-10-06 DIAGNOSIS — G8929 Other chronic pain: Secondary | ICD-10-CM

## 2022-10-06 DIAGNOSIS — R293 Abnormal posture: Secondary | ICD-10-CM

## 2022-10-06 DIAGNOSIS — M25819 Other specified joint disorders, unspecified shoulder: Secondary | ICD-10-CM | POA: Diagnosis not present

## 2022-10-06 DIAGNOSIS — M6281 Muscle weakness (generalized): Secondary | ICD-10-CM

## 2022-10-06 LAB — CERVICOVAGINAL ANCILLARY ONLY
Bacterial Vaginitis (gardnerella): NEGATIVE
Candida Glabrata: NEGATIVE
Candida Vaginitis: POSITIVE — AB
Chlamydia: NEGATIVE
Comment: NEGATIVE
Comment: NEGATIVE
Comment: NEGATIVE
Comment: NEGATIVE
Comment: NEGATIVE
Comment: NORMAL
Neisseria Gonorrhea: NEGATIVE
Trichomonas: NEGATIVE

## 2022-10-06 NOTE — Therapy (Signed)
OUTPATIENT PHYSICAL THERAPY SHOULDER TREATMENT   Patient Name: Whitney Turner MRN: [id number] DOB:[birth year], 46 y.o., female Today's Date: 10/06/2022   PT End of Session - 10/06/22 0810     Visit Number 12    Number of Visits 16    Date for PT Re-Evaluation 10/19/22    PT Start Time 0804    PT Stop Time 0852    PT Time Calculation (min) 48 min    Activity Tolerance Patient tolerated treatment well               Past Medical History:  Diagnosis Date   Allergy    Heart murmur    Herpes    Past Surgical History:  Procedure Laterality Date   HERNIA REPAIR  1982   KNEE SURGERY  08/2011   TONSILECTOMY/ADENOIDECTOMY WITH MYRINGOTOMY  2000   Patient Active Problem List   Diagnosis Date Noted   Poor concentration 12/30/2020   Hyperactive behavior 12/30/2020   Impingement syndrome, shoulder, left 11/17/2020   Elevated LDL cholesterol level 12/24/2019   Right lower quadrant pain 12/24/2019   Right upper quadrant pain 12/24/2019   Effusion of right knee 06/25/2019   Hemorrhoids 12/04/2018   CIN I (cervical intraepithelial neoplasia I) 04/03/2018   Lateral epicondylitis of left elbow 02/15/2018   Left hand pain 02/23/2016   History of repair of ACL of Left knee 01/26/2016   Plantar fasciitis, bilateral 12/29/2015   Genital herpes 12/01/2012   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/2012   Allergic rhinitis 12/01/2012   Seasonal allergies 12/01/2012    PCP: Tandy Gaw, PA-C  REFERRING PROVIDER: Dr Rodney Langton  REFERRING DIAG: Impingement Lt shoulder   THERAPY DIAG:  Shoulder impingement  Chronic left shoulder pain  Muscle weakness (generalized)  Other symptoms and signs involving the musculoskeletal system  Abnormal posture  Rationale for Evaluation and Treatment Rehabilitation  ONSET DATE: 08/22/20  SUBJECTIVE:                                                                                                                                                                                       SUBJECTIVE STATEMENT: 10/06/22: Patient reports that she has the nagging pain in the area of the upper Lt shoulder and mid back - now Rt > Lt. Pt reports that she is having some intermittent pain or discomfort in the Lt shoulder but it is much better than it was before therapy and injection. She feels about 90% better overall.  Eval: Lt shoulder pain for about 2 years with increase in symptoms in June. 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 Now has difficulty  with any other upper body weights. Received injection 08/17/22 with some improvement.   PERTINENT HISTORY:  Lateral epicondylitis; knee pain; Lt knee scope ACL; arthritis   PAIN:  10/06/22: Are you having pain? Yes: NPRS scale: 3-4/10 Pain location: Lt shoulder - top of the shoulder Pain description: intermittent, nagging, increased with activity Aggravating factors: lifting; throwing; batting  Relieving factors: injection; avoiding activity   PRECAUTIONS: None   FALLS:  Has patient fallen in last 6 months? 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  Palpation: tightness and discomfort Lt biceps tendon and insertioni of lats Lt   DIAGNOSTIC FINDINGS:  MRI Lt shoulder 08/17/22 - 1. Mild tendinosis of the supraspinatus and infraspinatus tendon without a tear. 2. Mild arthropathy of the acromioclavicular joint with marrow edema on either side of the joint.  PATIENT SURVEYS:  FOTO 54  COGNITION:  Overall cognitive status: Within functional limits for tasks  assessed     SENSATION: WFL  POSTURE: Patient presents with head forward posture with increased thoracic kyphosis; shoulders rounded and elevated; scapulae abducted and rotated along the thoracic spine; head of the humerus anterior in orientation.   UPPER EXTREMITY ROM:   Active ROM Right eval Left eval  Shoulder flexion 161 151  Shoulder extension 54 54  Shoulder abduction 174 153  Shoulder adduction    Shoulder internal rotation Thumb T7 Thumb T4  Shoulder external rotation 107 shoulder 90/elbow 90 90  shoulder 90/elbow 90  Elbow flexion    Elbow extension    Wrist flexion    Wrist extension    Wrist ulnar deviation    Wrist radial deviation    Wrist pronation    Wrist supination    (Blank rows = not tested)  UPPER EXTREMITY MMT:  MMT Right eval Left eval  Shoulder flexion 5/5 4+/5  Shoulder extension 5/5 5/5  Shoulder abduction 5/5 5-/5  Shoulder adduction    Shoulder internal rotation 5/5 5-/5  Shoulder external rotation 5/5 4+/5  Middle trapezius 4/5 4/5  Lower trapezius 4+/5 4/5  Elbow flexion    Elbow extension    Wrist flexion    Wrist extension    Wrist ulnar deviation    Wrist radial deviation    Wrist pronation    Wrist supination    Grip strength (lbs)    (Blank rows = not tested)  PALPATION:  Muscular tightness Lt > Rt pecs; upper trap; leveator; teres; biceps   10/06/22: muscular tightness bilat mid thoracic musculature with decreased mobility with PA and lateral mobs of thoracic spine. Lt/Rt shoudler girdle musculature improving with less palpable tightness noted.    TODAY'S TREATMENT:  10/06/22:                         THEREX UBE L6 x 4 min 2 min fwd/2 min bwd Doorway stretch 3 positions 30 sec x 2 each (Added ball at wrist to increase pec stretch) Biceps stretch standing 30 sec x 2 Neuromuscular re-ed using ball btn hands for movement with tape in place to improve scapular and GH position/alignment Reviewed Prone series working on  posterior shoulder girdle strengthening and stabilization - arms at side, W, goals post, T, I, Y, superman   MANUAL THERAPY Skilled assessment and palpation for TPDN Trigger Point Dry-Needling  Treatment instructions: Expect mild to moderate muscle soreness. S/S of pneumothorax if dry needled over a lung field, and to seek immediate medical attention should they occur. Patient verbalized understanding of these instructions and education.  Patient Consent Given: Yes Education handout provided: Yes Muscles treated: thoracic paraspinals   Electrical stimulation performed: yes Parameters: mAmp x 5 to tolerance Treatment response/outcome: decreased muscular tightness   Taping: kinesotape bilat shoulders for anterior instability 1 strip head of humerus to distal medial scapulae; inhibition of upper trap 1 strip upper trap to medial scapulae/thoracic area       THEREX 09/28/22: UBE L6 x 4 min 2 min fwd/2 min bwd Doorway stretch 3 positions 30 sec x 2 each Biceps stretch standing 30 sec x 2 Prone series working on posterior shoulder girdle strengthening and stabilization - arms at side, W, goals post, T, I, Y, superman   MANUAL THERAPY Skilled assessment and palpation for TPDN Trigger Point Dry-Needling  Treatment instructions: Expect mild to moderate muscle soreness. S/S of pneumothorax if dry needled over a lung field, and to seek immediate medical attention should they occur. Patient verbalized understanding of these instructions and education.  Patient Consent Given: Yes Education handout provided: Yes Muscles treated: thoracic paraspinals T3-5 bilat; teres/lats; lt biceps;  Electrical stimulation performed: no Parameters: mAmp x 5 to tolerance Treatment response/outcome: decreased muscular tightness   Taping: kinesotape bilat shoulders for anterior instability 1 strip head of humerus to distal medial scapulae; inhibition of upper trap 1 strip upper trap to medial scapulae/thoracic  area     PATIENT EDUCATION: Education details: POC; HEP; postural correction  Person educated: Patient Education method: Explanation, Demonstration, Tactile cues, Verbal cues, and Handouts Education comprehension: verbalized understanding, returned demonstration, verbal cues required, tactile cues required, and needs further education   HOME EXERCISE PROGRAM: Access Code: [email or web address] URL: [email or web address] Date: 08/30/2022 Prepared by: Corlis Leak  Exercises - Seated Cervical Retraction  - 3 x daily - 7 x weekly - 1 sets - 10 reps - Standing Scapular Retraction  - 3 x daily - 7 x weekly - 1 sets - 10 reps - 10 hold - Shoulder External Rotation and Scapular Retraction  - 3 x daily - 7 x weekly - 1 sets - 10 reps -   hold - Shoulder External Rotation in 45 Degrees Abduction  - 2 x daily - 7 x weekly - 1-2 sets - 10 reps - 3 sec  hold - Doorway Pec Stretch at 60 Degrees Abduction  - 3 x daily - 7 x weekly - 1 sets - 3 reps - Doorway Pec Stretch at 90 Degrees Abduction  - 3 x daily - 7 x weekly - 1 sets - 3 reps - 30 seconds  hold - Doorway Pec Stretch at 120 Degrees Abduction  - 3 x daily - 7 x weekly - 1 sets - 3 reps - 30 second hold  hold - Bicep Stretch at Table  - 2 x daily - 7 x weekly - 1 sets - 3 reps - 30 sec  hold - Chest and Bicep Stretch - Arms Behind Back  - 2 x daily - 7 x weekly - 1 sets - 3 reps - 30 sec  hold - Standing Bicep Stretch at Wall  - 2 x daily - 7 x weekly -  1 sets - 3 reps - 30 sec  hold - Standing Pectoral Release with Ball at [employer]  - 2 x daily - 7 x weekly - Standing Infraspinatus/Teres Minor Release with Ball at [employer]  - 2 x daily - 7 x weekly - Supine Chest Stretch on Foam Roll  - 2 x daily - 7 x weekly - 1 sets - 1 reps - 2-5 min  sec  hold - Bicep Stretch at Table  - 2 x daily - 7 x weekly - 1 sets - 3 reps - 20-30 sec  hold - Supine Lower Trunk Rotation  - 1 x daily - 7 x weekly - 1 sets - 3-5 reps - 20-30 sec  hold - Standing Shoulder  Row Reactive Isometric  - 1 x daily - 7 x weekly - 1 sets - 10 reps - 30-45 sec  hold - Drawing Bow  - 1 x daily - 7 x weekly - 2-3 sets - 10 reps - 3 sec  hold - Shoulder External Rotation Reactive Isometrics  - 1 x daily - 7 x weekly - 1 sets - 5-10 reps - 10-30 sec  hold - Shoulder Internal Rotation Reactive Isometrics  - 2 x daily - 7 x weekly - 1 sets - 10 reps - 3-5 sec  hold - Prone Scapular Retraction  - 2 x daily - 7 x weekly - 1 sets - 5-10 reps - 3-5 sec  hold  Patient Education - Trigger Point Dry Needling - TENS Unit  ASSESSMENT:  CLINICAL IMPRESSION: 09/30/22: Continued work on scapular alignment and muscular balance through Lt shoulder girdle; addressing posterior shoulder girdle strength. Continued dry needling, manual work including thoracic mobs to improved thoracic paraspinals and muscular balance  Eval: Patient is a 46 y.o. female who was seen today for physical therapy evaluation and treatment for impingement Lt shoulder.    OBJECTIVE IMPAIRMENTS decreased activity tolerance, decreased mobility, decreased ROM, decreased strength, increased fascial restrictions, impaired flexibility, impaired UE functional use, improper body mechanics, postural dysfunction, and pain.   ACTIVITY LIMITATIONS carrying, lifting, reach over head, and sports activities   PARTICIPATION LIMITATIONS: cleaning, laundry, community activity, and occupation  PERSONAL FACTORS Past/current experiences, Profession, and 1 comorbidity: arthritis are also affecting patient's functional outcome.   REHAB POTENTIAL: Good  CLINICAL DECISION MAKING: Stable/uncomplicated  EVALUATION COMPLEXITY: Low   GOALS: Goals reviewed with patient? Yes  SHORT TERM GOALS: Target date: 09/21/2022    Independent in initial HEP Baseline: Goal status: INITIAL  2.  Increase awareness of posture and alignment with posterior shoulder girdle engaged  Baseline:  Goal status: INITIAL  LONG TERM GOALS: Target date:  10/19/2022    Decrease Lt shoulder pain by 75-100% allowing patient to return to all normal work and recreational activities  Baseline:  Goal status: INITIAL  2.  Improve posture and alignment with patient to demonstrate upright posture with posterior shoulder girdle engaged - increased strength in middle and lower traps Baseline:  Goal status: INITIAL  3.  Increase Lt shoulder ROM in end range shoulder flexion and scaption by 5-7 degrees with no pain with ROM  Baseline:  Goal status: INITIAL  4.  Increase strength Lt shoulder to 5/5  Baseline:  Goal status: INITIAL  5.  Independent in HEP with return to gym and resistive training  Baseline:  Goal status: INITIAL  6.  Improve functional limitation score to 71 Baseline: 54 Goal status: INITIAL   PLAN: PT FREQUENCY: 2x/week  PT DURATION: 8  weeks  PLANNED INTERVENTIONS: Therapeutic exercises, Therapeutic activity, Neuromuscular re-education, Patient/Family education, Self Care, Joint mobilization, Aquatic Therapy, Dry Needling, Electrical stimulation, Cryotherapy, Moist heat, Taping, Ultrasound, Ionotophoresis /ml Dexamethasone, Manual therapy, and Re-evaluation  PLAN FOR NEXT SESSION: Review and progress with exercise program; manual work and/or modalities as indicated. Continue work on posture and alignment, stretching, strengthening, restoring muscular balance in the shoulder girdle working o]posterior shoulder girdle - middle and lower traps with inhibition of upper traps. Consider trial of eccentric biceps work to increased strength in biceps    Shemeca Lukasik Rober Minion, PT, MPH  10/06/2022, 8:11 AM

## 2022-10-07 ENCOUNTER — Other Ambulatory Visit: Payer: Self-pay

## 2022-10-07 DIAGNOSIS — B379 Candidiasis, unspecified: Secondary | ICD-10-CM

## 2022-10-07 MED ORDER — FLUCONAZOLE 150 MG PO TABS: 150.0000 mg | ORAL_TABLET | Freq: Once | ORAL | 0 refills | Status: AC

## 2022-10-07 NOTE — Progress Notes (Signed)
Diflucan sent per protocol for positive yeast infection.

## 2022-10-11 ENCOUNTER — Encounter: Payer: Self-pay | Admitting: Rehabilitative and Restorative Service Providers"

## 2022-10-11 ENCOUNTER — Ambulatory Visit: Payer: 59 | Admitting: Rehabilitative and Restorative Service Providers"

## 2022-10-11 DIAGNOSIS — M25819 Other specified joint disorders, unspecified shoulder: Secondary | ICD-10-CM | POA: Diagnosis not present

## 2022-10-11 DIAGNOSIS — M6281 Muscle weakness (generalized): Secondary | ICD-10-CM

## 2022-10-11 DIAGNOSIS — G8929 Other chronic pain: Secondary | ICD-10-CM

## 2022-10-11 DIAGNOSIS — R29898 Other symptoms and signs involving the musculoskeletal system: Secondary | ICD-10-CM

## 2022-10-11 DIAGNOSIS — R293 Abnormal posture: Secondary | ICD-10-CM

## 2022-10-11 NOTE — Therapy (Addendum)
OUTPATIENT PHYSICAL THERAPY SHOULDER TREATMENT AND DISCHARGE SUMMARY   PHYSICAL THERAPY DISCHARGE SUMMARY  Visits from Start of Care: 13  Current functional level related to goals / functional outcomes: See progress note for discharge status   Remaining deficits: Unknown    Education / Equipment: HEP    Patient agrees to discharge. Patient goals were partially met. Patient is being discharged due to being pleased with the current functional level. Safaa demonstrated and reported good progress in pain level. She demonstrated increased strength and function. She needs to continue with independent HEP   Dovie Kapusta P. Leonor Liv PT, MPH 11/29/22 3:10 PM   Patient Name: Ruhani Umland MRN: [id number] DOB:Mar 24, 1976, 46 y.o., female Today's Date: 10/11/2022   PT End of Session - 10/11/22 0807     Visit Number 13    Number of Visits 16    Date for PT Re-Evaluation 10/19/22    PT Start Time 0802    PT Stop Time 0850    PT Time Calculation (min) 48 min    Activity Tolerance Patient tolerated treatment well               Past Medical History:  Diagnosis Date   Allergy    Heart murmur    Herpes    Past Surgical History:  Procedure Laterality Date   HERNIA REPAIR  1982   KNEE SURGERY  08/2011   TONSILECTOMY/ADENOIDECTOMY WITH MYRINGOTOMY  2000   Patient Active Problem List   Diagnosis Date Noted   Poor concentration 12/30/2020   Hyperactive behavior 12/30/2020   Impingement syndrome, shoulder, left 11/17/2020   Elevated LDL cholesterol level 12/24/2019   Right lower quadrant pain 12/24/2019   Right upper quadrant pain 12/24/2019   Effusion of right knee 06/25/2019   Hemorrhoids 12/04/2018   CIN I (cervical intraepithelial neoplasia I) 04/03/2018   Lateral epicondylitis of left elbow 02/15/2018   Left hand pain 02/23/2016   History of repair of ACL of Left knee 01/26/2016   Plantar fasciitis, bilateral 12/29/2015   Genital herpes 12/01/2012   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 She had some soreness but not so much pain. Pt reports that she is having some intermittent pain or discomfort in the Lt shoulder but it is much better than it was before therapy and injection. She feels about 90% better overall.  Eval: Lt shoulder pain for about 2 years with increase in symptoms in June. 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 Now has difficulty with any other upper body weights. Received injection 08/17/22 with some improvement.   PERTINENT HISTORY:  Lateral epicondylitis; knee pain; Lt knee scope ACL; arthritis   PAIN:  10/11/22: Are you having pain? Yes: NPRS scale: 4/10 Pain location: Lt shoulder - top of the shoulder Pain description: intermittent, nagging, increased with activity Aggravating factors: lifting; throwing; batting  Relieving factors: injection; avoiding activity   PRECAUTIONS: None   FALLS:  Has patient fallen in last 6 months? No  LIVING ENVIRONMENT: Lives with: lives with their family Lives in: House/apartment   OCCUPATION: Work for [employer]; sports official; driving; in the gym 3 days/wk - aerobics and weights - avoiding UE resistive  exercises   PLOF: Independent  PATIENT GOALS get the shoulder better; get shoulder stronger  OBJECTIVE:    09/10/22: Patient reports continued progress and has done some exercises back in the gym. She has some difficulty fitting exercises into her work schedule.  Patient AROM Lt shoulder approximating AROM Rt with some persistent tightness in the upper shoulder.   Palpation: tightness and discomfort Lt biceps tendon and insertioni of lats Lt   DIAGNOSTIC FINDINGS:  MRI Lt shoulder 08/17/22 - 1. Mild tendinosis of the supraspinatus and infraspinatus tendon without a tear. 2. Mild arthropathy of the acromioclavicular joint with marrow edema on either side of the joint.  PATIENT SURVEYS:  FOTO 54  COGNITION:  Overall cognitive status: Within functional limits for tasks assessed     SENSATION: WFL  POSTURE: Patient presents with head forward posture with increased thoracic kyphosis; shoulders rounded and elevated; scapulae abducted and rotated along the thoracic spine; head of the humerus anterior in orientation.   UPPER EXTREMITY ROM:   Active ROM Right eval Left eval  Shoulder flexion 161 151  Shoulder extension 54 54  Shoulder abduction 174 153  Shoulder adduction    Shoulder internal rotation Thumb T7 Thumb T4  Shoulder external rotation 107 shoulder 90/elbow 90 90  shoulder 90/elbow 90  Elbow flexion    Elbow extension    Wrist flexion    Wrist extension    Wrist ulnar deviation    Wrist radial deviation    Wrist pronation    Wrist supination    (Blank rows = not tested)  UPPER EXTREMITY MMT:  MMT Right eval Left eval  Shoulder flexion 5/5 4+/5  Shoulder extension 5/5 5/5  Shoulder abduction 5/5 5-/5  Shoulder adduction    Shoulder internal rotation 5/5 5-/5  Shoulder external rotation 5/5 4+/5  Middle trapezius 4/5 4/5  Lower trapezius 4+/5 4/5  Elbow flexion    Elbow extension    Wrist flexion    Wrist extension    Wrist ulnar deviation    Wrist  radial deviation    Wrist pronation    Wrist supination    Grip strength (lbs)    (Blank rows = not tested)  PALPATION:  Muscular tightness Lt > Rt pecs; upper  trap; leveator; teres; biceps   10/06/22: muscular tightness bilat mid thoracic musculature with decreased mobility with PA and lateral mobs of thoracic spine. Lt/Rt shoudler girdle musculature improving with less palpable tightness noted.    TODAY'S TREATMENT:  10/11/22:  THEREX UBE L6 x 4 min 2 min fwd/2 min bwd Doorway stretch 3 positions 30 sec x 2 each (Added ball at wrist to increase pec stretch) Biceps stretch standing 30 sec x 2 Neuromuscular re-ed using ball btn hands for movement with tape in place to improve scapular and GH position/alignment Reviewed Prone series working on posterior shoulder girdle strengthening and stabilization - arms at side, W, goals post, T, I, Y, superman   MANUAL THERAPY Skilled assessment and palpation for TPDN Trigger Point Dry-Needling  Treatment instructions: Expect mild to moderate muscle soreness. S/S of pneumothorax if dry needled over a lung field, and to seek immediate medical attention should they occur. Patient verbalized understanding of these instructions and education.  Patient Consent Given: Yes Education handout provided: Yes Muscles treated: thoracic paraspinals   Electrical stimulation performed: yes Parameters: mAmp x 5 to tolerance Treatment response/outcome: decreased muscular tightness                             10/06/22 THEREX UBE L6 x 4 min 2 min fwd/2 min bwd Doorway stretch 3 positions 30 sec x 2 each (Added ball at wrist to increase pec stretch) Biceps stretch standing 30 sec x 2 Neuromuscular re-ed using ball btn hands for movement with tape in place to improve scapular and GH position/alignment Reviewed Prone series working on posterior shoulder girdle strengthening and stabilization - arms at side, W, goals post, T, I, Y, superman   MANUAL  THERAPY Skilled assessment and palpation for TPDN Trigger Point Dry-Needling  Treatment instructions: Expect mild to moderate muscle soreness. S/S of pneumothorax if dry needled over a lung field, and to seek immediate medical attention should they occur. Patient verbalized understanding of these instructions and education.  Patient Consent Given: Yes Education handout provided: Yes Muscles treated: thoracic paraspinals   Electrical stimulation performed: yes Parameters: mAmp x 5 to tolerance Treatment response/outcome: decreased muscular tightness   Taping: kinesotape bilat shoulders for anterior instability 1 strip head of humerus to distal medial scapulae; inhibition of upper trap 1 strip upper trap to medial scapulae/thoracic area    PATIENT EDUCATION: Education details: POC; HEP; postural correction  Person educated: Patient Education method: Explanation, Demonstration, Tactile cues, Verbal cues, and Handouts Education comprehension: verbalized understanding, returned demonstration, verbal cues required, tactile cues required, and needs further education   HOME EXERCISE PROGRAM: Access Code: [email or web address] URL: [email or web address] Date: 08/30/2022 Prepared by: Corlis Leak  Exercises - Seated Cervical Retraction  - 3 x daily - 7 x weekly - 1 sets - 10 reps - Standing Scapular Retraction  - 3 x daily - 7 x weekly - 1 sets - 10 reps - 10 hold - Shoulder External Rotation and Scapular Retraction  - 3 x daily - 7 x weekly - 1 sets - 10 reps -   hold - Shoulder External Rotation in 45 Degrees Abduction  - 2 x daily - 7 x weekly - 1-2 sets - 10 reps - 3 sec  hold - Doorway Pec Stretch at 60 Degrees Abduction  - 3 x daily - 7 x weekly - 1 sets - 3 reps - Doorway Pec Stretch at 90 Degrees Abduction  - 3 x daily -  7 x weekly - 1 sets - 3 reps - 30 seconds  hold - Doorway Pec Stretch at 120 Degrees Abduction  - 3 x daily - 7 x weekly - 1 sets - 3 reps - 30 second hold  hold -  Bicep Stretch at Table  - 2 x daily - 7 x weekly - 1 sets - 3 reps - 30 sec  hold - Chest and Bicep Stretch - Arms Behind Back  - 2 x daily - 7 x weekly - 1 sets - 3 reps - 30 sec  hold - Standing Bicep Stretch at Wall  - 2 x daily - 7 x weekly - 1 sets - 3 reps - 30 sec  hold - Standing Pectoral Release with Ball at Wall  - 2 x daily - 7 x weekly - Standing Infraspinatus/Teres Minor Release with Ball at Guardian Life InsuranceWall  - 2 x daily - 7 x weekly - Supine Chest Stretch on Foam Roll  - 2 x daily - 7 x weekly - 1 sets - 1 reps - 2-5 min  sec  hold - Bicep Stretch at Table  - 2 x daily - 7 x weekly - 1 sets - 3 reps - 20-30 sec  hold - Supine Lower Trunk Rotation  - 1 x daily - 7 x weekly - 1 sets - 3-5 reps - 20-30 sec  hold - Standing Shoulder Row Reactive Isometric  - 1 x daily - 7 x weekly - 1 sets - 10 reps - 30-45 sec  hold - Drawing Bow  - 1 x daily - 7 x weekly - 2-3 sets - 10 reps - 3 sec  hold - Shoulder External Rotation Reactive Isometrics  - 1 x daily - 7 x weekly - 1 sets - 5-10 reps - 10-30 sec  hold - Shoulder Internal Rotation Reactive Isometrics  - 2 x daily - 7 x weekly - 1 sets - 10 reps - 3-5 sec  hold - Prone Scapular Retraction  - 2 x daily - 7 x weekly - 1 sets - 5-10 reps - 3-5 sec  hold  Patient Education - Trigger Point Dry Needling - TENS Unit  ASSESSMENT:  CLINICAL IMPRESSION: 10/11/22: Progressing gradually with report of overall decreased pain in the Lt shoulder. Treatment continues to  work on scapular alignment and muscular balance through Lt shoulder girdle; addressing posterior shoulder girdle strength. Continued dry needling, manual work including thoracic mobs to improved thoracic paraspinals and muscular balance  Eval: Patient is a 46 y.o. female who was seen today for physical therapy evaluation and treatment for impingement Lt shoulder.    OBJECTIVE IMPAIRMENTS decreased activity tolerance, decreased mobility, decreased ROM, decreased strength, increased fascial  restrictions, impaired flexibility, impaired UE functional use, improper body mechanics, postural dysfunction, and pain.   ACTIVITY LIMITATIONS carrying, lifting, reach over head, and sports activities   PARTICIPATION LIMITATIONS: cleaning, laundry, community activity, and occupation  PERSONAL FACTORS Past/current experiences, Profession, and 1 comorbidity: arthritis are also affecting patient's functional outcome.   REHAB POTENTIAL: Good  CLINICAL DECISION MAKING: Stable/uncomplicated  EVALUATION COMPLEXITY: Low   GOALS: Goals reviewed with patient? Yes  SHORT TERM GOALS: Target date: 09/21/2022    Independent in initial HEP Baseline: Goal status: INITIAL  2.  Increase awareness of posture and alignment with posterior shoulder girdle engaged  Baseline:  Goal status: INITIAL  LONG TERM GOALS: Target date: 10/19/2022    Decrease Lt shoulder pain by 75-100% allowing patient to return to all  normal work and recreational activities  Baseline:  Goal status: INITIAL  2.  Improve posture and alignment with patient to demonstrate upright posture with posterior shoulder girdle engaged - increased strength in middle and lower traps Baseline:  Goal status: INITIAL  3.  Increase Lt shoulder ROM in end range shoulder flexion and scaption by 5-7 degrees with no pain with ROM  Baseline:  Goal status: INITIAL  4.  Increase strength Lt shoulder to 5/5  Baseline:  Goal status: INITIAL  5.  Independent in HEP with return to gym and resistive training  Baseline:  Goal status: INITIAL  6.  Improve functional limitation score to 71 Baseline: 54 Goal status: INITIAL   PLAN: PT FREQUENCY: 2x/week  PT DURATION: 8 weeks  PLANNED INTERVENTIONS: Therapeutic exercises, Therapeutic activity, Neuromuscular re-education, Patient/Family education, Self Care, Joint mobilization, Aquatic Therapy, Dry Needling, Electrical stimulation, Cryotherapy, Moist heat, Taping, Ultrasound,  Ionotophoresis /ml Dexamethasone, Manual therapy, and Re-evaluation  PLAN FOR NEXT SESSION: Review and progress with exercise program; manual work and/or modalities as indicated. Continue work on posture and alignment, stretching, strengthening, restoring muscular balance in the shoulder girdle working o]posterior shoulder girdle - middle and lower traps with inhibition of upper traps. Consider trial of eccentric biceps work to increased strength in biceps    Savien Mamula Rober Minion, PT, MPH  10/11/2022, 8:49 AM

## 2022-10-13 ENCOUNTER — Encounter: Payer: Self-pay | Admitting: Rehabilitative and Restorative Service Providers"

## 2022-10-29 ENCOUNTER — Other Ambulatory Visit: Payer: Self-pay | Admitting: Sports Medicine

## 2022-10-29 DIAGNOSIS — M7542 Impingement syndrome of left shoulder: Secondary | ICD-10-CM

## 2022-11-11 ENCOUNTER — Ambulatory Visit: Payer: 59 | Admitting: Obstetrics & Gynecology

## 2022-11-24 NOTE — Progress Notes (Unsigned)
   ANNUAL EXAM Patient name: Whitney Turner MRN [id number]  Date of birth: November 01, 1976 Chief Complaint:   No chief complaint on file.  History of Present Illness:   Whitney Turner is a 47 y.o. [phone] female being seen today for a routine annual exam.   Current complaints: ***  No LMP recorded.  Current birth control: ***  Last pap: 06/2019. Results were: NILM w/ HRHPV negative. H/O abnormal pap: yes Was in 2014 - ASCUS/HPV pos. Had colpo - CIN1.  Pap with HPV normal in 2019 and 2020.    Last MXR: 12/2020  Health Maintenance Due  Topic Date Due   MAMMOGRAM  12/31/2021   INFLUENZA VACCINE  Never done   PAP SMEAR-Modifier  07/01/2022   COVID-19 Vaccine (3 - 2023-24 season) 07/23/2022    Review of Systems:   Pertinent items are noted in HPI Denies any headaches, blurred vision, fatigue, shortness of breath, chest pain, abdominal pain, abnormal vaginal discharge/itching/odor/irritation, problems with periods, bowel movements, urination, or intercourse unless otherwise stated above. *** Pertinent History Reviewed:  Reviewed past medical,surgical, social and family history.  Reviewed problem list, medications and allergies. Physical Assessment:  There were no vitals filed for this visit.There is no height or weight on file to calculate BMI.   Physical Examination:  General appearance - well appearing, and in no distress Mental status - alert, oriented to person, place, and time Psych:  She has a normal mood and affect Skin - warm and dry, normal color, no suspicious lesions noted Chest - effort normal Heart - normal rate  Breasts - breasts appear normal, no suspicious masses, no skin or nipple changes or axillary nodes Abdomen - soft, nontender, nondistended, no masses or organomegaly Pelvic -  VULVA: normal appearing vulva with no masses, tenderness or lesions  VAGINA: normal appearing vagina with normal color and discharge, no lesions  CERVIX: normal appearing cervix without  discharge or lesions, no CMT UTERUS: uterus is felt to be normal size, shape, consistency and nontender  ADNEXA: No adnexal masses or tenderness noted. Extremities:  No swelling or varicosities noted  Chaperone present for exam  No results found for this or any previous visit (from the past 24 hour(s)).  Assessment & Plan:  Diagnoses and all orders for this visit:  Encounter for annual routine gynecological examination  - Cervical cancer screening: Discussed guidelines. Pap with HPV normal in 2019 and 2020. Pap with HPV done.  - STD Testing: accepts - Birth Control: Discussed options and their risks, benefits and common side effects; discussed VTE with estrogen containing options. Desires: OCPs ([facility]) - Breast Health: Encouraged self breast awareness/SBE. Teaching provided. Discussed limits of clinical breast exam for detecting breast cancer. Rx given for MXR - F/U 12 months and prn     No orders of the defined types were placed in this encounter.   Meds: No orders of the defined types were placed in this encounter.   Follow-up: No follow-ups on file.  Radene Gunning, MD 11/24/2022 4:46 PM

## 2022-11-25 ENCOUNTER — Encounter: Payer: Self-pay | Admitting: Obstetrics and Gynecology

## 2022-11-25 ENCOUNTER — Ambulatory Visit (INDEPENDENT_AMBULATORY_CARE_PROVIDER_SITE_OTHER): Payer: 59 | Admitting: Obstetrics and Gynecology

## 2022-11-25 ENCOUNTER — Other Ambulatory Visit (HOSPITAL_COMMUNITY)
Admission: RE | Admit: 2022-11-25 | Discharge: 2022-11-25 | Disposition: A | Discharge status: Home / Self Care | Payer: 59 | Source: Ambulatory Visit | Attending: Obstetrics and Gynecology | Admitting: Obstetrics and Gynecology

## 2022-11-25 ENCOUNTER — Other Ambulatory Visit: Payer: Self-pay | Admitting: Obstetrics and Gynecology

## 2022-11-25 VITALS — BP 119/78 | HR 67 | Ht 61.0 in | Wt 134.0 lb

## 2022-11-25 DIAGNOSIS — A6000 Herpesviral infection of urogenital system, unspecified: Secondary | ICD-10-CM

## 2022-11-25 DIAGNOSIS — Z01419 Encounter for gynecological examination (general) (routine) without abnormal findings: Secondary | ICD-10-CM | POA: Diagnosis present

## 2022-11-25 DIAGNOSIS — Z1231 Encounter for screening mammogram for malignant neoplasm of breast: Secondary | ICD-10-CM

## 2022-11-25 MED ORDER — VALACYCLOVIR HCL 500 MG PO TABS: 500.0000 mg | ORAL_TABLET | Freq: Every day | ORAL | 3 refills | Status: DC

## 2022-11-25 MED ORDER — ETONOGESTREL-ETHINYL ESTRADIOL 0.12-0.015 MG/24HR VA RING: VAGINAL_RING | VAGINAL | 12 refills | Status: DC

## 2022-11-30 LAB — CYTOLOGY - PAP
Adequacy: ABSENT
Comment: NEGATIVE
Comment: NEGATIVE
Comment: NEGATIVE
Diagnosis: NEGATIVE
Diagnosis: REACTIVE
HPV 16: NEGATIVE
HPV 18 / 45: NEGATIVE
High risk HPV: POSITIVE — AB

## 2022-12-01 ENCOUNTER — Encounter: Payer: Self-pay | Admitting: Obstetrics and Gynecology

## 2022-12-03 ENCOUNTER — Other Ambulatory Visit: Payer: Self-pay | Admitting: Obstetrics and Gynecology

## 2022-12-03 DIAGNOSIS — A6000 Herpesviral infection of urogenital system, unspecified: Secondary | ICD-10-CM

## 2022-12-09 ENCOUNTER — Other Ambulatory Visit: Payer: Self-pay | Admitting: *Deleted

## 2022-12-09 DIAGNOSIS — A6000 Herpesviral infection of urogenital system, unspecified: Secondary | ICD-10-CM

## 2022-12-09 MED ORDER — VALACYCLOVIR HCL 500 MG PO TABS: 500.0000 mg | ORAL_TABLET | Freq: Every day | ORAL | 2 refills | Status: DC

## 2022-12-09 NOTE — Telephone Encounter (Signed)
Pt called requesting that her Valtrex be sent in for a 90 supply.  RF sent

## 2022-12-22 ENCOUNTER — Ambulatory Visit (INDEPENDENT_AMBULATORY_CARE_PROVIDER_SITE_OTHER): Payer: 59

## 2022-12-22 DIAGNOSIS — Z1231 Encounter for screening mammogram for malignant neoplasm of breast: Secondary | ICD-10-CM | POA: Diagnosis not present

## 2023-02-01 ENCOUNTER — Encounter: Payer: 59 | Admitting: Physician Assistant

## 2023-02-07 ENCOUNTER — Encounter: Payer: Self-pay | Admitting: Physician Assistant

## 2023-02-07 ENCOUNTER — Ambulatory Visit (INDEPENDENT_AMBULATORY_CARE_PROVIDER_SITE_OTHER): Payer: 59 | Admitting: Physician Assistant

## 2023-02-07 VITALS — BP 116/60 | HR 62 | Ht 61.0 in | Wt 134.0 lb

## 2023-02-07 DIAGNOSIS — R41840 Attention and concentration deficit: Secondary | ICD-10-CM

## 2023-02-07 DIAGNOSIS — Z23 Encounter for immunization: Secondary | ICD-10-CM | POA: Diagnosis not present

## 2023-02-07 DIAGNOSIS — Z131 Encounter for screening for diabetes mellitus: Secondary | ICD-10-CM

## 2023-02-07 DIAGNOSIS — Z13220 Encounter for screening for lipoid disorders: Secondary | ICD-10-CM

## 2023-02-07 DIAGNOSIS — Z0000 Encounter for general adult medical examination without abnormal findings: Secondary | ICD-10-CM

## 2023-02-07 MED ORDER — BUPROPION HCL ER (SR) 150 MG PO TB12: 150.0000 mg | ORAL_TABLET | Freq: Two times a day (BID) | ORAL | 2 refills | Status: DC

## 2023-02-07 NOTE — Progress Notes (Signed)
Complete physical exam  Patient: Whitney Turner   DOB: [birth year]   47 y.o. Female  MRN: [id number]  Subjective:    Chief Complaint  Patient presents with   Annual Exam    Whitney Turner is a 47 y.o. female who presents today for a complete physical exam. She reports consuming a general diet.  Lift weights, run 2-3 miles a few times a week.  She generally feels well. She reports sleeping well. She does have additional problems to discuss today.      Most recent depression screenings:    02/07/2023    9:33 AM 12/30/2020    9:30 AM  PHQ 2/9 Scores  PHQ - 2 Score 0 0  PHQ- 9 Score 0 0    Vision:Within last year and Dental: No current dental problems  Patient Active Problem List   Diagnosis Date Noted   Poor concentration 12/30/2020   Hyperactive behavior 12/30/2020   Impingement syndrome, shoulder, left 11/17/2020   Elevated LDL cholesterol level 12/24/2019   Right lower quadrant pain 12/24/2019   Right upper quadrant pain 12/24/2019   Effusion of right knee 06/25/2019   Hemorrhoids 12/04/2018   CIN I (cervical intraepithelial neoplasia I) 04/03/2018   Lateral epicondylitis of left elbow 02/15/2018   Left hand pain 02/23/2016   History of repair of ACL of Left knee 01/26/2016   Plantar fasciitis, bilateral 12/29/2015   Genital herpes 12/01/2012   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   etonogestrel-ethinyl estradiol (NUVARING) 0.12-0.015 MG/24HR vaginal ring Insert vaginally and leave in place for 3 consecutive weeks, then remove for 1 week.   meloxicam (MOBIC) 15 MG tablet 1 TAB BY MOUTH DAILY WITH A MEAL FOR 2 WEEKS THEN DAILY AS NEEDED.    valACYclovir (VALTREX) 500 MG tablet Take 1 tablet (500 mg total) by mouth daily. Can increase to twice a day for 5 days in the event of a recurrence   [DISCONTINUED] norgestimate-ethinyl estradiol (ORTHO-CYCLEN) 0.25-35 MG-MCG tablet Take 1 tablet by mouth daily. (Patient not taking: Reported on 11/25/2022)   No facility-administered medications prior to visit.    Review of Systems  All other systems reviewed and are negative.         Objective:     BP 116/60   Pulse 62   Ht 5\' 1"  (1.549 m)   Wt 134 lb (60.8 kg)   SpO2 100%   BMI 25.32 kg/m  BP Readings from Last 3 Encounters:  02/07/23 116/60  11/25/22 119/78  10/05/22 121/73   Wt Readings from Last 3 Encounters:  02/07/23 134 lb (60.8 kg)  11/25/22 134 lb (60.8 kg)  10/05/22 128 lb (58.1 kg)  .Marland Kitchen    02/07/2023    9:33 AM 12/30/2020    9:30 AM 12/21/2019    1:43 PM 12/04/2018    9:54 AM 07/14/2017    9:37 AM  Depression screen PHQ 2/9  Decreased Interest 0 0 0 0 0  Down, Depressed, Hopeless 0 0 0 0 0  PHQ - 2 Score 0 0 0 0 0  Altered sleeping 0 0 0  Tired, decreased energy 0 0 0    Change in appetite 0 0 0    Feeling bad or failure about yourself  0 0 0    Trouble concentrating 0 0 0    Moving slowly or fidgety/restless 0 0 0    Suicidal thoughts 0 0 0    PHQ-9 Score 0 0 0    Difficult doing work/chores Not difficult at all Not difficult at all Not difficult at all     .Marland Kitchen    02/07/2023    9:33 AM 12/30/2020    9:30 AM 12/21/2019    1:43 PM 12/04/2018    9:54 AM  GAD 7 : Generalized Anxiety Score  Nervous, Anxious, on Edge 0 0 0 0  Control/stop worrying 0 0 0 0  Worry too much - different things 0 0 0 0  Trouble relaxing 1 0 0 0  Restless 2 0 0 0  Easily annoyed or irritable 2 0 0 0  Afraid - awful might happen 0 0 0 0  Total GAD 7 Score 5 0 0 0  Anxiety Difficulty Not difficult at all Not difficult at all Not difficult at all Not difficult at all        Physical Exam  BP 116/60   Pulse 62   Ht 5'  1" (1.549 m)   Wt 134 lb (60.8 kg)   SpO2 100%   BMI 25.32 kg/m   General Appearance:    Alert, cooperative, no distress, appears stated age  Head:    Normocephalic, without obvious abnormality, atraumatic  Eyes:    PERRL, conjunctiva/corneas clear, EOM's intact, fundi    benign, both eyes  Ears:    Normal TM's and external ear canals, both ears  Nose:   Nares normal, septum midline, mucosa normal, no drainage    or sinus tenderness  Throat:   Lips, mucosa, and tongue normal; teeth and gums normal  Neck:   Supple, symmetrical, trachea midline, no adenopathy;    thyroid:  no enlargement/tenderness/nodules; no carotid   bruit or JVD  Back:     Symmetric, no curvature, ROM normal, no CVA tenderness  Lungs:     Clear to auscultation bilaterally, respirations unlabored  Chest Wall:    No tenderness or deformity   Heart:    Regular rate and rhythm, S1 and S2 normal, no murmur, rub   or gallop     Abdomen:     Soft, non-tender, bowel sounds active all four quadrants,    no masses, no organomegaly        Extremities:   Extremities normal, atraumatic, no cyanosis or edema  Pulses:   2+ and symmetric all extremities  Skin:   Skin color, texture, turgor normal, no rashes or lesions  Lymph nodes:   Cervical, supraclavicular, and axillary nodes normal  Neurologic:   CNII-XII intact, normal strength, sensation and reflexes    throughout      Assessment & Plan:    Routine Health Maintenance and Physical Exam  Immunization History  Administered Date(s) Administered   PFIZER(Purple Top)SARS-COV-2 Vaccination 07/24/2020, 08/14/2020   Tdap 12/01/2012, 02/07/2023    Health Maintenance  Topic Date Due   INFLUENZA VACCINE  02/20/2023 (Originally 06/22/2022)   COVID-19 Vaccine (3 - 2023-24 season) 02/07/2024 (Originally 07/23/2022)   MAMMOGRAM  12/23/2023   PAP SMEAR-Modifier  11/25/2025   DTaP/Tdap/Td (3 - Td or Tdap) 02/06/2033   Hepatitis C Screening  Completed   HIV Screening  Completed  HPV VACCINES  Aged Out    Discussed health benefits of physical activity, and encouraged her to engage in regular exercise appropriate for her age and condition.   Marland KitchenLuetta Turner was seen today for annual exam.  Diagnoses and all orders for this visit:  Routine physical examination -     TSH -     Lipid Panel w/reflex Direct LDL -     COMPLETE METABOLIC PANEL WITH GFR -     CBC with Differential/Platelet  Screening for diabetes mellitus -     COMPLETE METABOLIC PANEL WITH GFR  Screening for lipid disorders -     Lipid Panel w/reflex Direct LDL  Need for Tdap vaccination -     Tdap vaccine greater than or equal to 7yo IM  Poor concentration -     buPROPion (WELLBUTRIN SR) 150 MG 12 hr tablet; Take 1 tablet (150 mg total) by mouth 2 (two) times daily.   .. Discussed 150 minutes of exercise a week.  Encouraged vitamin D 1000 units and Calcium 1300mg  or 4 servings of dairy a day.  Fasting labs ordered Vitals look great PHQ no concerns Pap UTD Mammogram UTD Colonoscopy at 47 yo Tdap given today Declined flu and covid vaccine Discussed concentration Start wellbutrin bid  Discussed side effects Follow up in 6 months     Iran Planas, PA-C

## 2023-02-07 NOTE — Patient Instructions (Signed)

## 2023-03-01 ENCOUNTER — Other Ambulatory Visit: Payer: Self-pay | Admitting: Physician Assistant

## 2023-03-01 DIAGNOSIS — R41840 Attention and concentration deficit: Secondary | ICD-10-CM

## 2023-03-05 ENCOUNTER — Other Ambulatory Visit: Payer: Self-pay | Admitting: Sports Medicine

## 2023-03-05 DIAGNOSIS — M7542 Impingement syndrome of left shoulder: Secondary | ICD-10-CM

## 2023-03-08 ENCOUNTER — Other Ambulatory Visit: Payer: Self-pay | Admitting: Physician Assistant

## 2023-03-08 ENCOUNTER — Encounter: Payer: Self-pay | Admitting: Physician Assistant

## 2023-03-08 ENCOUNTER — Ambulatory Visit: Payer: 59 | Admitting: Physician Assistant

## 2023-03-08 VITALS — BP 126/74 | HR 90 | Ht 63.0 in | Wt 132.0 lb

## 2023-03-08 DIAGNOSIS — J029 Acute pharyngitis, unspecified: Secondary | ICD-10-CM | POA: Diagnosis not present

## 2023-03-08 DIAGNOSIS — K121 Other forms of stomatitis: Secondary | ICD-10-CM | POA: Diagnosis not present

## 2023-03-08 DIAGNOSIS — B002 Herpesviral gingivostomatitis and pharyngotonsillitis: Secondary | ICD-10-CM

## 2023-03-08 LAB — POCT RAPID STREP A (OFFICE): Rapid Strep A Screen: NEGATIVE

## 2023-03-08 MED ORDER — METHYLPREDNISOLONE 4 MG PO TBPK: ORAL_TABLET | ORAL | 0 refills | Status: DC

## 2023-03-08 MED ORDER — MAGIC MOUTHWASH W/LIDOCAINE: 15.0000 mL | Freq: Four times a day (QID) | ORAL | 0 refills | Status: DC | PRN
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
## 2023-03-08 NOTE — Patient Instructions (Addendum)
Allegra D to start to replace zyrtec Mouth wash as needed Medrol pack taper

## 2023-03-09 LAB — GC/CHLAMYDIA PROBE, AMP (THROAT)
Chlamydia trachomatis RNA: NOT DETECTED
Neisseria gonorrhoeae RNA: NOT DETECTED

## 2023-03-09 LAB — HOUSE ACCOUNT TRACKING

## 2023-03-10 ENCOUNTER — Other Ambulatory Visit (INDEPENDENT_AMBULATORY_CARE_PROVIDER_SITE_OTHER): Payer: 59

## 2023-03-10 ENCOUNTER — Encounter: Payer: Self-pay | Admitting: Physician Assistant

## 2023-03-10 ENCOUNTER — Ambulatory Visit: Payer: 59 | Admitting: Sports Medicine

## 2023-03-10 DIAGNOSIS — G8929 Other chronic pain: Secondary | ICD-10-CM | POA: Diagnosis not present

## 2023-03-10 DIAGNOSIS — M7542 Impingement syndrome of left shoulder: Secondary | ICD-10-CM | POA: Diagnosis not present

## 2023-03-10 DIAGNOSIS — M76822 Posterior tibial tendinitis, left leg: Secondary | ICD-10-CM | POA: Diagnosis not present

## 2023-03-10 DIAGNOSIS — M25512 Pain in left shoulder: Secondary | ICD-10-CM | POA: Diagnosis not present

## 2023-03-10 NOTE — Assessment & Plan Note (Signed)
Also having some discomfort posterior left ankle, on exam she has significant weakness to inversion and firing of the tibialis posterior, we discussed the anatomy and pathophysiology. I did give her some tibialis posterior conditioning and I do expect this to be significantly stronger at the follow-up.

## 2023-03-10 NOTE — Assessment & Plan Note (Addendum)
Whitney Turner returns, she is a very pleasant 47 year old female, chronic left shoulder pain, she has had some glenohumeral injections in the past, they typically do pretty well, MR arthrography did not show labral injury, she did however have some cuff tendinosis without tearing. Today she has more bicipital signs with a positive speeds test. We did a glenohumeral joint injection, this will base the long head of the biceps as well. We also discussed scapular retraction exercises such as rows.  She will also do some shrugs in the gym. She was weak with scapular retraction and shoulder extension today, so we will retest this at the follow-up visit.

## 2023-03-10 NOTE — Progress Notes (Addendum)
    Procedures performed today:    Procedure: Real-time Ultrasound Guided injection of the left glenohumeral joint Device: Samsung HS60  Verbal informed consent obtained.  Time-out conducted.  Noted no overlying erythema, induration, or other signs of local infection.  Skin prepped in a sterile fashion.  Local anesthesia: Topical Ethyl chloride.  With sterile technique and under real time ultrasound guidance: Normal-appearing joint and labrum visualized, I advanced a 22-gauge spinal needle into the joint from a posterior approach, 1 cc Kenalog 40, 2 cc lidocaine, 2 cc bupivacaine injected easily Completed without difficulty  Advised to call if fevers/chills, erythema, induration, drainage, or persistent bleeding.  Images permanently stored and available for review in PACS.  Impression: Technically successful ultrasound guided injection.  Independent interpretation of notes and tests performed by another provider:   None.  Brief History, Exam, Impression, and Recommendations:    Chronic left shoulder pain Whitney Turner returns, she is a very pleasant 47 year old female, chronic left shoulder pain, she has had some glenohumeral injections in the past, they typically do pretty well, MR arthrography did not show labral injury, she did however have some cuff tendinosis without tearing. Today she has more bicipital signs with a positive speeds test. We did a glenohumeral joint injection, this will base the long head of the biceps as well. We also discussed scapular retraction exercises such as rows.  She will also do some shrugs in the gym. She was weak with scapular retraction and shoulder extension today, so we will retest this at the follow-up visit.  Tibialis posterior tendinitis, left Also having some discomfort posterior left ankle, on exam she has significant weakness to inversion and firing of the tibialis posterior, we discussed the anatomy and pathophysiology. I did give her some tibialis  posterior conditioning and I do expect this to be significantly stronger at the follow-up.    ____________________________________________ [name]. Benjamin Stain, M.D., ABFM., CAQSM., AME. Primary Care and Sports Medicine [facility]  Adjunct Professor of Family Medicine  [employer] [facility] [employer]  Restaurant manager, fast food

## 2023-03-11 MED ORDER — NYSTATIN 100000 UNIT/ML MT SUSP: 5.0000 mL | Freq: Three times a day (TID) | OROMUCOSAL | 0 refills | Status: DC | PRN

## 2023-03-11 NOTE — Progress Notes (Signed)
Negative for chlamydia or gonorrhea.

## 2023-03-28 ENCOUNTER — Telehealth: Payer: Self-pay | Admitting: Physician Assistant

## 2023-03-28 NOTE — Telephone Encounter (Signed)
Patient called stating she is not getting any better from her last visit with [facility]. She is now wanting to know what the next steps are.

## 2023-04-19 ENCOUNTER — Encounter: Payer: Self-pay | Admitting: Medical-Surgical

## 2023-04-19 ENCOUNTER — Ambulatory Visit: Payer: 59 | Admitting: Medical-Surgical

## 2023-04-19 VITALS — BP 110/73 | HR 75 | Resp 20 | Ht 63.0 in | Wt 137.6 lb

## 2023-04-19 DIAGNOSIS — J029 Acute pharyngitis, unspecified: Secondary | ICD-10-CM

## 2023-04-19 LAB — POC COVID19 BINAXNOW: SARS Coronavirus 2 Ag: NEGATIVE

## 2023-04-19 LAB — POCT INFLUENZA A/B
Influenza A, POC: NEGATIVE
Influenza B, POC: NEGATIVE

## 2023-04-19 LAB — POCT RAPID STREP A (OFFICE): Rapid Strep A Screen: NEGATIVE

## 2023-04-19 NOTE — Progress Notes (Signed)
        Established patient visit  History, exam, impression, and plan:  1. Sore throat Pleasant 47 year old female presenting today with reports of a sore throat that started approximately 4 days ago.  She was recently on vacation with her brother who tested positive for strep pharyngitis.  She was also around several coworkers who have been sick.  Today she reports that she has sinus congestion, cervical lymphadenopathy, bilateral ear pressure (right worse than left), fatigue, and sore throat that is worse on the right.  Denies fever, chills, nausea, vomiting, shortness of breath, chest pain, and cough.  Has been using over-the-counter Advil cold/sinus with minimal benefit.  On exam, afebrile, lungs CTA with even and unlabored respirations.  HRR, S1/S2 normal.  Bilateral TMs normal.  Mild cervical lymphadenopathy bilaterally.  Posterior oropharyngeal erythema, history of tonsillectomy.  No frontal or maxillary sinus tenderness.  No rhinorrhea.  POCT strep, flu, and COVID-negative.  Discussed likely viral etiology of symptoms.  Recommend continuing symptomatic treatment but monitor for failure to improve after 7 to 10 days or improvement followed by resickening.  Patient verbalized understanding and is agreeable to the plan. - POCT rapid strep A - POCT Influenza A/B - POC COVID-19   Procedures performed this visit: None.  Return if symptoms worsen or fail to improve.  __________________________________ Thayer Ohm, DNP, APRN, FNP-BC Primary Care and Sports Medicine [facility] [place]

## 2023-05-02 ENCOUNTER — Ambulatory Visit: Payer: 59 | Admitting: Sports Medicine

## 2023-05-23 ENCOUNTER — Telehealth: Payer: 59 | Admitting: Physician Assistant

## 2023-05-23 DIAGNOSIS — J029 Acute pharyngitis, unspecified: Secondary | ICD-10-CM

## 2023-05-23 NOTE — Progress Notes (Signed)
[place]

## 2023-06-03 ENCOUNTER — Telehealth: Payer: Self-pay

## 2023-06-03 NOTE — Telephone Encounter (Signed)
Pt called stating that her refrigerator broke and she is concerned her Nuva Ring is ineffective. Pt was told that insurance may not cover an early refill. Pt was told to use a back up method of birth control (condoms or abstinence) until refill is available. Pt states she doesn't use it for birth control and she will wait for next refill.

## 2023-06-07 ENCOUNTER — Ambulatory Visit: Payer: 59 | Admitting: Physician Assistant

## 2023-06-25 ENCOUNTER — Other Ambulatory Visit: Payer: Self-pay | Admitting: Sports Medicine

## 2023-06-25 DIAGNOSIS — M7542 Impingement syndrome of left shoulder: Secondary | ICD-10-CM

## 2023-08-15 ENCOUNTER — Ambulatory Visit: Payer: 59 | Admitting: Physician Assistant

## 2023-08-24 ENCOUNTER — Other Ambulatory Visit: Payer: Self-pay | Admitting: Obstetrics and Gynecology

## 2023-08-24 DIAGNOSIS — A6000 Herpesviral infection of urogenital system, unspecified: Secondary | ICD-10-CM

## 2023-09-29 ENCOUNTER — Ambulatory Visit: Payer: 59 | Admitting: Sports Medicine

## 2023-10-06 ENCOUNTER — Encounter: Payer: Self-pay | Admitting: Sports Medicine

## 2023-10-06 ENCOUNTER — Ambulatory Visit (INDEPENDENT_AMBULATORY_CARE_PROVIDER_SITE_OTHER): Payer: 59 | Admitting: Sports Medicine

## 2023-10-06 ENCOUNTER — Other Ambulatory Visit (INDEPENDENT_AMBULATORY_CARE_PROVIDER_SITE_OTHER): Payer: 59

## 2023-10-06 ENCOUNTER — Other Ambulatory Visit: Payer: Self-pay

## 2023-10-06 DIAGNOSIS — G8929 Other chronic pain: Secondary | ICD-10-CM

## 2023-10-06 DIAGNOSIS — Z9889 Other specified postprocedural states: Secondary | ICD-10-CM

## 2023-10-06 DIAGNOSIS — M25512 Pain in left shoulder: Secondary | ICD-10-CM

## 2023-10-06 MED ORDER — TRIAMCINOLONE ACETONIDE 40 MG/ML IJ SUSP
80.0000 mg | Freq: Once | INTRAMUSCULAR | Status: AC
  Administered 2023-10-06: 80 mg via INTRAMUSCULAR

## 2023-10-06 NOTE — Progress Notes (Signed)
    Procedures performed today:    Procedure: Real-time Ultrasound Guided injection of the left glenohumeral joint Device: Samsung HS60  Verbal informed consent obtained.  Time-out conducted.  Noted no overlying erythema, induration, or other signs of local infection.  Skin prepped in a sterile fashion.  Local anesthesia: Topical Ethyl chloride.  With sterile technique and under real time ultrasound guidance: Mild degenerative changes noted, 1 cc Kenalog 40, 2 cc lidocaine, 2 cc bupivacaine injected easily Completed without difficulty  Advised to call if fevers/chills, erythema, induration, drainage, or persistent bleeding.  Images permanently stored and available for review in PACS.  Impression: Technically successful ultrasound guided injection.  Procedure: Real-time Ultrasound Guided injection of the left knee Device: Samsung HS60  Verbal informed consent obtained.  Time-out conducted.  Noted no overlying erythema, induration, or other signs of local infection.  Skin prepped in a sterile fashion.  Local anesthesia: Topical Ethyl chloride.  With sterile technique and under real time ultrasound guidance: Trace effusion noted, 1 cc Kenalog 40, 2 cc lidocaine, 2 cc bupivacaine injected easily Completed without difficulty  Advised to call if fevers/chills, erythema, induration, drainage, or persistent bleeding.  Images permanently stored and available for review in PACS.  Impression: Technically successful ultrasound guided injection.  Independent interpretation of notes and tests performed by another provider:   None.  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 Increasing pain medial joint line. ACL does feel a bit loose but negative pivot shift test and all other specialized physical examination testing of the knee is normal. We did an  injection back in 2020 and it worked well. Repeat injection today, also adding additional physical therapy. If insufficient improvement after about a month or 6 weeks we will consider additional imaging.  Chronic left shoulder pain Whitney Turner has chronic left shoulder pain, she has had glenohumeral joint injections in the past and has done well. MR arthrography showed cuff tendinosis without labral tearing. We did an injection in April and she did really well, now with recurrence of pain, repeat glenohumeral joint injection and she does need some formal physical therapy as she has not been quite diligent with the home PT regimen. Return to see me 6 weeks as needed.    ____________________________________________ Whitney Turner. Whitney Turner, M.D., ABFM., CAQSM., AME. Primary Care and Sports Medicine [facility]  Adjunct Professor of Family Medicine  [place] [employer] [facility] [employer]  Restaurant manager, fast food

## 2023-10-06 NOTE — Assessment & Plan Note (Signed)
Whitney Turner has chronic left shoulder pain, she has had glenohumeral joint injections in the past and has done well. MR arthrography showed cuff tendinosis without labral tearing. We did an injection in April and she did really well, now with recurrence of pain, repeat glenohumeral joint injection and she does need some formal physical therapy as she has not been quite diligent with the home PT regimen. Return to see me 6 weeks as needed.

## 2023-10-06 NOTE — Addendum Note (Signed)
Addended by: Carren Rang A on: 10/06/2023 03:05 PM   Modules accepted: Orders

## 2023-10-06 NOTE — Assessment & Plan Note (Signed)
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 Increasing pain medial joint line. ACL does feel a bit loose but negative pivot shift test and all other specialized physical examination testing of the knee is normal. We did an injection back in 2020 and it worked well. Repeat injection today, also adding additional physical therapy. If insufficient improvement after about a month or 6 weeks we will consider additional imaging.

## 2023-10-11 NOTE — Therapy (Signed)
OUTPATIENT PHYSICAL THERAPY EVALUATION   Patient Name: Whitney Turner MRN: [id number] DOB:October 16, 1976, 47 y.o., female Today's Date: 10/12/2023  END OF SESSION:  PT End of Session - 10/12/23 0806     Visit Number 1    Number of Visits 13    Date for PT Re-Evaluation 11/26/23    Authorization Type [employer]- 20 visits per year    PT Start Time 0805    PT Stop Time 0845    PT Time Calculation (min) 40 min    Activity Tolerance Patient tolerated treatment well    Behavior During Therapy [facility] for tasks assessed/performed             Past Medical History:  Diagnosis Date   Allergy    Heart murmur    Herpes    Past Surgical History:  Procedure Laterality Date   HERNIA REPAIR  1982   KNEE SURGERY  08/2011   TONSILECTOMY/ADENOIDECTOMY WITH MYRINGOTOMY  2000   Patient Active Problem List   Diagnosis Date Noted   Tibialis posterior tendinitis, left 03/10/2023   Mouth ulcer 03/08/2023   Sore throat 03/08/2023   Poor concentration 12/30/2020   Hyperactive behavior 12/30/2020   Chronic left shoulder pain 11/17/2020   Elevated LDL cholesterol level 12/24/2019   Right lower quadrant pain 12/24/2019   Right upper quadrant pain 12/24/2019   Effusion of right knee 06/25/2019   Hemorrhoids 12/04/2018   CIN I (cervical intraepithelial neoplasia I) 04/03/2018   Lateral epicondylitis of left elbow 02/15/2018   Left hand pain 02/23/2016   History of repair of ACL of Left knee 01/26/2016   Plantar fasciitis, bilateral 12/29/2015   Genital herpes 12/01/2012   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/2012   Allergic rhinitis 12/01/2012   Seasonal allergies 12/01/2012    PCP: Jomarie Longs, PA-C  REFERRING PROVIDER: Monica Becton, MD    REFERRING DIAG:  [id number] (ICD-10-CM) - History of repair of ACL  [id number] (ICD-10-CM) - Chronic left shoulder pain    THERAPY DIAG:  Chronic left shoulder pain  Muscle weakness (generalized)  Chronic pain of left knee  Other symptoms and signs  involving the musculoskeletal system  Rationale for Evaluation and Treatment: Rehabilitation  ONSET DATE: knee- acute on chronic October 2024; shoulder- 2021  SUBJECTIVE:   SUBJECTIVE STATEMENT: She reports multiple issues noting tightness in her neck/shoulders/Rt hip, Lt shoulder and Lt knee pain. She had had dry needling before and feels that this has been helpful in reducing her pain. She was hiking and thinks she might have hyperextended the left knee on 09/22/23. She thinks that she aggravated her meniscus. She denies any popping/clicking. It feels unstable and occasionally aches. 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 She has not been running or squatting since October, but wants to be able to these things. Patient reports during COVID she worked at [employer] and was lifting heavy bags overhead and thinks that when she injured her shoulder. Sometimes the shoulder just hurts, primarily when she is sleeping (she sleeps on her stomach). She had injections in both the shoulder and knee at recent MD visit. The injections have helped to reduce her pain.  PERTINENT HISTORY: ACLR Lt 2011 per patient hamstring autograft  PAIN:  Are you having pain? Yes: NPRS scale: 6/10 Pain location: Lt shoulder Pain description: "it just hurts" Aggravating factors: catching softball,overhead lifting (shoulder); walking downhill (knee)  Relieving factors: rest, injection  PRECAUTIONS: None  RED FLAGS: None  WEIGHT BEARING RESTRICTIONS: No  FALLS:  Has patient fallen in last 6 months? No  LIVING ENVIRONMENT: Lives with: lives with their family Lives in: House/apartment Stairs: Yes: Internal: multiple flights steps; on right going up Has following equipment at home: None  OCCUPATION: self-employed; sports official (softball, volleyball, and swimming); Printmaker  PLOF: Independent  PATIENT GOALS: "I want to get stronger and get rid of tightness."    NEXT MD VISIT: 11/17/23  OBJECTIVE:  Note: Objective measures were completed at Evaluation unless otherwise noted.  DIAGNOSTIC FINDINGS: Lt shoulder MRI:   IMPRESSION: 1. Mild tendinosis of the supraspinatus and infraspinatus tendon without a tear. 2. Mild arthropathy of the acromioclavicular joint with marrow edema on either side of the joint.  PATIENT SURVEYS:  FOTO shoulder: 70% function to 72% predicted; knee: 67% function to 75% predicted  COGNITION: Overall cognitive status: Within functional limits for tasks assessed     SENSATION: Not tested  EDEMA:  No obvious swelling about the Lt knee or shoulder   MUSCLE LENGTH: Hamstrings: WNL  POSTURE: rounded shoulders and forward head  PALPATION: TTP Lt patellar tendon  Shoulder not assessed   SPECIAL TESTS:  (+) trendelenberg   (-) Valgus, Varus, Anterior Drawer No time to complete shoulder special tests   FUNCTIONAL TESTS:  Squat: WNL SL squat: valgus, LOB, trunk flexion   GAIT: Distance walked: 10 ft  Assistive device utilized: None Level of assistance: Complete Independence Comments: WNL  LOWER EXTREMITY ROM:  Active ROM Right eval Left eval  Hip flexion    Hip extension    Hip abduction    Hip adduction    Hip internal rotation    Hip external rotation    Knee flexion Full  Full pain   Knee extension Full  Full   Ankle dorsiflexion    Ankle plantarflexion    Ankle inversion    Ankle eversion     (Blank rows = not tested)  LOWER EXTREMITY MMT:  MMT Right eval Left eval  Hip flexion 4+ 4  Hip extension 4+ 4  Hip abduction 4 4  Hip adduction    Hip internal rotation    Hip external rotation    Knee flexion 5 4+  Knee extension 5 4+ pain   Ankle dorsiflexion    Ankle plantarflexion    Ankle inversion    Ankle eversion     (Blank rows = not tested) CERVICAL ROM:   Active ROM A/PROM (deg) eval  Flexion Full   Extension Full   Right lateral flexion   Left lateral flexion    Right rotation 70  Left rotation 78   (Blank rows = not tested)  UPPER EXTREMITY ROM:  Active ROM Right eval Left eval  Shoulder flexion Full  Full pulling in lat   Shoulder extension    Shoulder abduction Full  Full   Shoulder adduction    Shoulder extension    Shoulder internal rotation  Full   Shoulder external rotation  Full   Elbow flexion    Elbow extension    Wrist flexion    Wrist extension    Wrist ulnar deviation    Wrist radial deviation    Wrist pronation    Wrist supination     (Blank rows = not tested) UPPER EXTREMITY MMT:  MMT Right eval Left eval  Shoulder flexion 5 5  Shoulder extension    Shoulder abduction 5 5  Shoulder adduction    Shoulder extension  Shoulder internal rotation 5 5  Shoulder external rotation 5 4  Middle trapezius 4 4  Lower trapezius 3+ 3+  Elbow flexion    Elbow extension    Wrist flexion    Wrist extension    Wrist ulnar deviation    Wrist radial deviation    Wrist pronation    Wrist supination    Grip strength     (Blank rows = not tested)  [facility] Adult PT Treatment:                                                DATE: 10/12/23 Therapeutic Exercise: Demonstrated and issue initial HEP.    PATIENT EDUCATION:  Education details: see treatment; POC; assessment findings  Person educated: Patient Education method: Explanation, Demonstration, Tactile cues, Verbal cues, and Handouts Education comprehension: verbalized understanding, returned demonstration, verbal cues required, and needs further education  HOME EXERCISE PROGRAM: Access Code: [email or web address] URL: [email or web address] Date: 10/12/2023 Prepared by: Letitia Libra  Exercises - Side Stepping with Resistance at Ankles  - 1 x daily - 7 x weekly - 2 sets - 10 reps - Shoulder External Rotation and Scapular Retraction with Resistance  - 1 x daily - 7 x weekly - 2 sets - 10 reps  ASSESSMENT:  CLINICAL IMPRESSION: Patient is a 47 y.o. female who  was seen today for physical therapy evaluation and treatment for chronic Lt knee and Lt shoulder pain. Upon assessment she is noted to have full Lt shoulder AROM reporting pulling/tightness at end range of shoulder flexion. Overall good strength about the Lt shoulder with mild rotator cuff weakness. She does have periscapular weakness and postural abnormalities. Her knee has full ROM with patient reporting pain at end range of flexion. She has mild Lt knee weakness and bilateral hip weakness as well as aberrant SL squat mechanics. Patient will benefit from skilled PT to address the above stated deficits in order to optimize their function and assist in overall pain reduction.     OBJECTIVE IMPAIRMENTS: decreased activity tolerance, decreased knowledge of condition, difficulty walking, decreased strength, impaired UE functional use, improper body mechanics, postural dysfunction, and pain.   ACTIVITY LIMITATIONS: squatting, reach over head, and locomotion level  PARTICIPATION LIMITATIONS: community activity, occupation, and recreational activity  PERSONAL FACTORS: Age, Past/current experiences, Profession, Time since onset of injury/illness/exacerbation, and 3+ comorbidities: see PMH above  are also affecting patient's functional outcome.   REHAB POTENTIAL: Good  CLINICAL DECISION MAKING: Evolving/moderate complexity  EVALUATION COMPLEXITY: Moderate   GOALS: Goals reviewed with patient? Yes  SHORT TERM GOALS: Target date: 11/02/2023     Patient will be independent and compliant with initial HEP.   Baseline: issued at eval  Goal status: INITIAL  2.  Patient will demonstrate proper form with step down on the LLE without pain to signify improvements in knee stability.  Baseline: poor form with SL squat  Goal status: INITIAL  3. Patient will report 50% improvement in her pain to reduce current functional limitations  Baseline: 6/10 at eval  Goal status: INITIAL   LONG TERM GOALS:  Target date: 11/26/23  Patient will demonstrate 5/5 middle trap and 4/5 lower trap strength to improve postural stability.  Baseline: see above Goal status: INITIAL  2.  Patient will score at least 72% shoulder and 75% knee on FOTO to signify clinically meaningful improvement in functional  abilities.   Baseline: see above Goal status: INITIAL  3.  Patient will demonstrate 5/5 bilateral hip strength to improve stability about the chain with prolonged walking activity.  Baseline: see above Goal status: INITIAL  4.  Patient will be independent with advanced home program, including appropriate gym exercises in order to assist in management of her chronic conditions.  Baseline: initial HEP issued  Goal status: INITIAL   PLAN:  PT FREQUENCY: 1-2x/week  PT DURATION: 6 weeks  PLANNED INTERVENTIONS: 97164- PT Re-evaluation, 97110-Therapeutic exercises, 97530- Therapeutic activity, [id number]- Neuromuscular re-education, 97535- Self Care, 16109- Manual therapy, [id number]- Aquatic Therapy, 97014- Electrical stimulation (unattended), [id number]- Electrical stimulation (manual), 97016- Vasopneumatic device, [id number]- Ionotophoresis 4mg /ml Dexamethasone, Patient/Family education, Taping, Dry Needling, Cryotherapy, and Moist heat  PLAN FOR NEXT SESSION: update HEP to include further postural strengthening, rotator cuff strengthening, CKC LE strengthening; TPDN as needed    Letitia Libra, PT, DPT, ATC 10/12/23 2:03 PM

## 2023-10-12 ENCOUNTER — Ambulatory Visit: Payer: 59 | Attending: Sports Medicine

## 2023-10-12 ENCOUNTER — Other Ambulatory Visit: Payer: Self-pay

## 2023-10-12 DIAGNOSIS — G8929 Other chronic pain: Secondary | ICD-10-CM | POA: Diagnosis not present

## 2023-10-12 DIAGNOSIS — Z9889 Other specified postprocedural states: Secondary | ICD-10-CM | POA: Diagnosis not present

## 2023-10-12 DIAGNOSIS — M25512 Pain in left shoulder: Secondary | ICD-10-CM | POA: Insufficient documentation

## 2023-10-12 DIAGNOSIS — M6281 Muscle weakness (generalized): Secondary | ICD-10-CM

## 2023-10-12 DIAGNOSIS — R29898 Other symptoms and signs involving the musculoskeletal system: Secondary | ICD-10-CM

## 2023-10-14 ENCOUNTER — Ambulatory Visit
Admission: EM | Admit: 2023-10-14 | Discharge: 2023-10-14 | Disposition: A | Discharge status: Home / Self Care | Payer: 59 | Attending: Family Medicine | Admitting: Family Medicine

## 2023-10-14 DIAGNOSIS — M549 Dorsalgia, unspecified: Secondary | ICD-10-CM | POA: Diagnosis not present

## 2023-10-14 DIAGNOSIS — R3915 Urgency of urination: Secondary | ICD-10-CM | POA: Diagnosis not present

## 2023-10-14 DIAGNOSIS — R350 Frequency of micturition: Secondary | ICD-10-CM | POA: Insufficient documentation

## 2023-10-14 LAB — POCT URINALYSIS DIP (MANUAL ENTRY)
Bilirubin, UA: NEGATIVE
Glucose, UA: NEGATIVE mg/dL
Ketones, POC UA: NEGATIVE mg/dL
Nitrite, UA: NEGATIVE
Protein Ur, POC: NEGATIVE mg/dL
Spec Grav, UA: <=1.005 — AB (ref 1.010–1.025)
Urobilinogen, UA: 0.2 U/dL
pH, UA: 6 (ref 5.0–8.0)

## 2023-10-14 MED ORDER — NITROFURANTOIN MONOHYD MACRO 100 MG PO CAPS: ORAL_CAPSULE | ORAL | 0 refills | Status: DC

## 2023-10-14 NOTE — ED Triage Notes (Signed)
Pt c/o urinary frequency x 3 days. Pos at home UTI test.

## 2023-10-14 NOTE — ED Provider Notes (Signed)
[facility]    CSN: [id number] Arrival date & time: 10/14/23  1158      History   Chief Complaint Chief Complaint  Patient presents with   Urinary Frequency    HPI Whitney Turner is a 47 y.o. female.   HPI  Past Medical History:  Diagnosis Date   Allergy    Heart murmur    Herpes     Patient Active Problem List   Diagnosis Date Noted   Tibialis posterior tendinitis, left 03/10/2023   Mouth ulcer 03/08/2023   Sore throat 03/08/2023   Poor concentration 12/30/2020   Hyperactive behavior 12/30/2020   Chronic left shoulder pain 11/17/2020   Elevated LDL cholesterol level 12/24/2019   Right lower quadrant pain 12/24/2019   Right upper quadrant pain 12/24/2019   Effusion of right knee 06/25/2019   Hemorrhoids 12/04/2018   CIN I (cervical intraepithelial neoplasia I) 04/03/2018   Lateral epicondylitis of left elbow 02/15/2018   Left hand pain 02/23/2016   History of repair of ACL of Left knee 01/26/2016   Plantar fasciitis, bilateral 12/29/2015   Genital herpes 12/01/2012   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/2012   Allergic rhinitis 12/01/2012   Seasonal allergies 12/01/2012    Past Surgical History:  Procedure Laterality Date   HERNIA REPAIR  1982   KNEE SURGERY  08/2011   TONSILECTOMY/ADENOIDECTOMY WITH MYRINGOTOMY  2000    OB History     Gravida  5   Para  3   Term  3   Preterm      AB  2   Living  3      SAB  2   IAB      Ectopic      Multiple      Live Births               Home Medications    Prior to Admission medications   Medication Sig Start Date End Date Taking? Authorizing Provider  nitrofurantoin, macrocrystal-monohydrate, (MACROBID) 100 MG capsule Take one cap PO Q12hr with food. 10/14/23  Yes Lattie Haw, MD  buPROPion [facility] SR) 150 MG 12 hr tablet TAKE 1 TABLET BY MOUTH TWICE A DAY 03/04/23   Breeback, Jade L, PA-C  cetirizine (ZYRTEC) 10 MG tablet Take 10 mg by mouth daily.    [provider]   etonogestrel-ethinyl estradiol (NUVARING) 0.12-0.015 MG/24HR vaginal ring Insert vaginally and leave in place for 3 consecutive weeks, then remove for 1 week. 11/25/22   Milas Hock, MD  meloxicam (MOBIC) 15 MG tablet TAKE 1 TABLET BY MOUTH DAILY WITH A MEAL FOR 2 WEEKS THEN DAILY AS NEEDED. 06/27/23   Monica Becton, MD  valACYclovir (VALTREX) 500 MG tablet Take 1 tablet (500 mg total) by mouth daily. Can increase to twice a day for 5 days in the event of a recurrence 12/09/22   Milas Hock, MD    Family History Family History  Problem Relation Age of Onset   Hypertension Father    Hyperlipidemia Father    Cancer Maternal Grandmother        breast cancer   Cancer Maternal Grandfather        lung cancer   Alzheimer's disease Paternal Grandmother    Cancer Paternal Grandfather        lung cancer    Social History Social History   Tobacco Use   Smoking status: Never   Smokeless tobacco: Never  Vaping Use   Vaping status: Never Used  Substance  Use Topics   Alcohol use: Yes    Comment: Socially   Drug use: Never     Allergies   Patient has no known allergies.   Review of Systems Review of Systems   Physical Exam Triage Vital Signs ED Triage Vitals  Encounter Vitals Group     BP 10/14/23 1208 123/83     Systolic BP Percentile --      Diastolic BP Percentile --      Pulse Rate 10/14/23 1208 69     Resp 10/14/23 1208 17     Temp 10/14/23 1208 98.5 F (36.9 C)     Temp Source 10/14/23 1208 Oral     SpO2 10/14/23 1208 99 %     Weight --      Height --      Head Circumference --      Peak Flow --      Pain Score 10/14/23 1209 0     Pain Loc --      Pain Education --      Exclude from Growth Chart --    No data found.  Updated Vital Signs BP 123/83 (BP Location: Right Arm)   Pulse 69   Temp 98.5 F (36.9 C) (Oral)   Resp 17   LMP 10/01/2023 (Approximate)   SpO2 99%   Visual Acuity Right Eye Distance:   Left Eye Distance:   Bilateral  Distance:    Right Eye Near:   Left Eye Near:    Bilateral Near:     Physical Exam   UC Treatments / Results  Labs (all labs ordered are listed, but only abnormal results are displayed) Labs Reviewed  POCT URINALYSIS DIP (MANUAL ENTRY) - Abnormal; Notable for the following components:      Result Value   Spec Grav, UA <=1.005 (*)    Blood, UA trace-lysed (*)    Leukocytes, UA Small (1+) (*)    All other components within normal limits  URINE CULTURE    EKG   Radiology No results found.  Procedures Procedures (including critical care time)  Medications Ordered in UC Medications - No data to display  Initial Impression / Assessment and Plan / UC Course  I have reviewed the triage vital signs and the nursing notes.  Pertinent labs & imaging results that were available during my care of the patient were reviewed by me and considered in my medical decision making (see chart for details).    Urine culture pending.  Begin Macrobid for one week. Followup with Family Doctor if not improved in one week.   Final Clinical Impressions(s) / UC Diagnoses   Final diagnoses:  Urinary frequency     Discharge Instructions      Increase fluid intake. May use non-prescription AZO for about two days, if desired, to decrease urinary discomfort.   If symptoms become significantly worse during the night or over the weekend, proceed to the local emergency room.     ED Prescriptions     Medication Sig Dispense Auth. Provider   nitrofurantoin, macrocrystal-monohydrate, (MACROBID) 100 MG capsule Take one cap PO Q12hr with food. 14 capsule Lattie Haw, MD      PDMP not reviewed this encounter.

## 2023-10-14 NOTE — Discharge Instructions (Signed)
Increase fluid intake. May use non-prescription AZO for about two days, if desired, to decrease urinary discomfort.  If symptoms become significantly worse during the night or over the weekend, proceed to the local emergency room.

## 2023-10-15 LAB — URINE CULTURE: Culture: NO GROWTH

## 2023-10-17 ENCOUNTER — Ambulatory Visit: Payer: 59 | Admitting: Rehabilitative and Restorative Service Providers"

## 2023-10-17 ENCOUNTER — Encounter: Payer: Self-pay | Admitting: Rehabilitative and Restorative Service Providers"

## 2023-10-17 DIAGNOSIS — R29898 Other symptoms and signs involving the musculoskeletal system: Secondary | ICD-10-CM

## 2023-10-17 DIAGNOSIS — M6281 Muscle weakness (generalized): Secondary | ICD-10-CM

## 2023-10-17 DIAGNOSIS — M25819 Other specified joint disorders, unspecified shoulder: Secondary | ICD-10-CM

## 2023-10-17 DIAGNOSIS — R293 Abnormal posture: Secondary | ICD-10-CM

## 2023-10-17 DIAGNOSIS — M25512 Pain in left shoulder: Secondary | ICD-10-CM | POA: Diagnosis not present

## 2023-10-17 DIAGNOSIS — G8929 Other chronic pain: Secondary | ICD-10-CM

## 2023-10-17 NOTE — Therapy (Signed)
OUTPATIENT PHYSICAL THERAPY TREATMENT   Patient Name: Whitney Turner MRN: [id number] DOB:1976/02/14, 47 y.o., female Today's Date: 10/17/2023  END OF SESSION:  PT End of Session - 10/17/23 0722     Visit Number 2    Number of Visits 13    Date for PT Re-Evaluation 11/26/23    Authorization Type [employer]- 20 visits per year    PT Start Time 0720    PT Stop Time 0812    PT Time Calculation (min) 52 min    Activity Tolerance Patient tolerated treatment well             Past Medical History:  Diagnosis Date   Allergy    Heart murmur    Herpes    Past Surgical History:  Procedure Laterality Date   HERNIA REPAIR  1982   KNEE SURGERY  08/2011   TONSILECTOMY/ADENOIDECTOMY WITH MYRINGOTOMY  2000   Patient Active Problem List   Diagnosis Date Noted   Tibialis posterior tendinitis, left 03/10/2023   Mouth ulcer 03/08/2023   Sore throat 03/08/2023   Poor concentration 12/30/2020   Hyperactive behavior 12/30/2020   Chronic left shoulder pain 11/17/2020   Elevated LDL cholesterol level 12/24/2019   Right lower quadrant pain 12/24/2019   Right upper quadrant pain 12/24/2019   Effusion of right knee 06/25/2019   Hemorrhoids 12/04/2018   CIN I (cervical intraepithelial neoplasia I) 04/03/2018   Lateral epicondylitis of left elbow 02/15/2018   Left hand pain 02/23/2016   History of repair of ACL of Left knee 01/26/2016   Plantar fasciitis, bilateral 12/29/2015   Genital herpes 12/01/2012   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/2012   Allergic rhinitis 12/01/2012   Seasonal allergies 12/01/2012    PCP: Jomarie Longs, PA-C  REFERRING PROVIDER: Monica Becton, MD    REFERRING DIAG:  [phone] (ICD-10-CM) - History of repair of ACL  [phone] (ICD-10-CM) - Chronic left shoulder pain    THERAPY DIAG:  Chronic left shoulder pain  Muscle weakness (generalized)  Chronic pain of left knee  Other symptoms and signs involving the musculoskeletal system  Shoulder  impingement  Abnormal posture  Rationale for Evaluation and Treatment: Rehabilitation  ONSET DATE: knee- acute on chronic October 2024; shoulder- 2021  SUBJECTIVE:   SUBJECTIVE STATEMENT: Patient reports that she has chronic tightness in the L shoulder and L lower back area. Knee is irritated with she walks down stairs. Shoulder feels better after shot. Knee is generally feeling better. Can feel LE exercise in hips.  Would like some new ideas for exercises.  EVAL: She reports multiple issues noting tightness in her neck/shoulders/Rt hip, Lt shoulder and Lt knee pain. She had had dry needling before and feels that this has been helpful in reducing her pain. She was hiking and thinks she might have hyperextended the left knee on 09/22/23. She thinks that she aggravated her meniscus. She denies any popping/clicking. It feels unstable and occasionally aches. 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 She has not been running or squatting since October, but wants to be able to these things. Patient reports during COVID she worked at [employer] and was lifting heavy bags overhead and thinks that when she injured her shoulder. Sometimes the shoulder just hurts, primarily when she is sleeping (she sleeps on her stomach). She had injections in both the shoulder and knee at recent MD visit. The injections have helped to reduce her pain.  PERTINENT HISTORY: ACLR Lt 2011  per patient hamstring autograft  PAIN:  Are you having pain? Yes: NPRS scale: 6/10 Pain location: Lt shoulder Pain description: "it just hurts" Aggravating factors: catching softball,overhead lifting (shoulder); walking downhill (knee)  Relieving factors: rest, injection  PRECAUTIONS: None  RED FLAGS: None   WEIGHT BEARING RESTRICTIONS: No  FALLS:  Has patient fallen in last 6 months? No  LIVING ENVIRONMENT: Lives with: lives with their family Lives in: House/apartment Stairs: Yes: Internal:  multiple flights steps; on right going up Has following equipment at home: None  OCCUPATION: self-employed; sports official (softball, volleyball, and swimming); Printmaker   PATIENT GOALS: "I want to get stronger and get rid of tightness."   NEXT MD VISIT: 11/17/23  OBJECTIVE:  Note: Objective measures were completed at Evaluation unless otherwise noted.  DIAGNOSTIC FINDINGS: Lt shoulder MRI:   IMPRESSION: 1. Mild tendinosis of the supraspinatus and infraspinatus tendon without a tear. 2. Mild arthropathy of the acromioclavicular joint with marrow edema on either side of the joint.  PATIENT SURVEYS:  FOTO shoulder: 70% function to 72% predicted; knee: 67% function to 75% predicted   SENSATION: Not tested  EDEMA:  No obvious swelling about the Lt knee or shoulder   MUSCLE LENGTH: Hamstrings: WNL  POSTURE: rounded shoulders and forward head  PALPATION: TTP Lt patellar tendon  Shoulder not assessed   SPECIAL TESTS:  (+) trendelenberg   (-) Valgus, Varus, Anterior Drawer No time to complete shoulder special tests   FUNCTIONAL TESTS:  Squat: WNL SL squat: valgus, LOB, trunk flexion   GAIT: Distance walked: 10 ft  Assistive device utilized: None Level of assistance: Complete Independence Comments: WNL  LOWER EXTREMITY ROM:  Active ROM Right eval Left eval  Hip flexion    Hip extension    Hip abduction    Hip adduction    Hip internal rotation    Hip external rotation    Knee flexion Full  Full pain   Knee extension Full  Full   Ankle dorsiflexion    Ankle plantarflexion    Ankle inversion    Ankle eversion     (Blank rows = not tested)  LOWER EXTREMITY MMT:  MMT Right eval Left eval  Hip flexion 4+ 4  Hip extension 4+ 4  Hip abduction 4 4  Hip adduction    Hip internal rotation    Hip external rotation    Knee flexion 5 4+  Knee extension 5 4+ pain   Ankle dorsiflexion    Ankle plantarflexion    Ankle inversion    Ankle  eversion     (Blank rows = not tested) CERVICAL ROM:   Active ROM A/PROM (deg) eval  Flexion Full   Extension Full   Right lateral flexion   Left lateral flexion   Right rotation 70  Left rotation 78   (Blank rows = not tested)  UPPER EXTREMITY ROM:  Active ROM Right eval Left eval  Shoulder flexion Full  Full pulling in lat   Shoulder extension    Shoulder abduction Full  Full   Shoulder adduction    Shoulder extension    Shoulder internal rotation  Full   Shoulder external rotation  Full   Elbow flexion    Elbow extension    Wrist flexion    Wrist extension    Wrist ulnar deviation    Wrist radial deviation    Wrist pronation    Wrist supination     (Blank rows = not tested) UPPER EXTREMITY MMT:  MMT Right eval Left eval  Shoulder flexion 5 5  Shoulder extension    Shoulder abduction 5 5  Shoulder adduction    Shoulder extension    Shoulder internal rotation 5 5  Shoulder external rotation 5 4  Middle trapezius 4 4  Lower trapezius 3+ 3+  Elbow flexion    Elbow extension    Wrist flexion    Wrist extension    Wrist ulnar deviation    Wrist radial deviation    Wrist pronation    Wrist supination    Grip strength     (Blank rows = not tested)  .[facility] Adult PT Treatment:                                                DATE: 10/17/23 Therapeutic Exercise: Standing Doorway stretch 3 positions 30 sec x 2  Scap squeeze with noodle 10 sec x 5 Scap squeeze ER with noodle 5 sec x 5  ER ball on wall L UE 1 min x 2  Supine  Prolonged snow angel ~ 2 min  Manual Therapy: STM L shoulder girdle  Skilled palpation to assess response to manual work and DN  Trigger Point Dry-Needling  Treatment instructions: Expect mild to moderate muscle soreness. S/S of pneumothorax if dry needled over a lung field, and to seek immediate medical attention should they occur. Patient verbalized understanding of these instructions and education.  Patient Consent Given:  Yes Education handout provided: Previously provided Muscles treated: upper trap; pec insertion at shoulder  Electrical stimulation performed: Yes Parameters:  mAmp current; intensity to patient tolerance  Treatment response/outcome: decreased palpable tightness   Neuromuscular re-ed: Postural correction   Modalities:   Moist heat L traps/shoulder x 10 min     [facility] Adult PT Treatment:                                                DATE: 10/12/23 Therapeutic Exercise: Demonstrated and issue initial HEP.    PATIENT EDUCATION:  Education details: see treatment; POC; assessment findings  Person educated: Patient Education method: Explanation, Demonstration, Tactile cues, Verbal cues, and Handouts Education comprehension: verbalized understanding, returned demonstration, verbal cues required, and needs further education  HOME EXERCISE PROGRAM: Access Code: [email or web address] URL: [email or web address] Date: 10/17/2023 Prepared by: Corlis Leak  Exercises - Side Stepping with Resistance at Ankles  - 1 x daily - 7 x weekly - 2 sets - 10 reps - Shoulder External Rotation and Scapular Retraction with Resistance  - 1 x daily - 7 x weekly - 2 sets - 10 reps - Shoulder W - External Rotation with Resistance  - 2 x daily - 7 x weekly - 1-2 sets - 10 reps - 5 sec  hold - Supine Chest Stretch on Foam Roll  - 2 x daily - 7 x weekly - 1 sets - 1 reps - 2-5 min  sec  hold  ASSESSMENT:  CLINICAL IMPRESSION: Patient demonstrates forward posture with shoulders elevated, head of the humerus anterior in orientation. She has tightness through upper trap; pecs; biceps. Added posterior shoulder girdle activation and prolonged stretch for pecs. Trial of DN to upper trap, pec insertion and biceps tendon. Good response with decreased palpable  tightness noted. Encouraged consistent postural correction and exercises.    EVAL: Patient is a 47 y.o. female who was seen today for physical therapy evaluation  and treatment for chronic Lt knee and Lt shoulder pain. Upon assessment she is noted to have full Lt shoulder AROM reporting pulling/tightness at end range of shoulder flexion. Overall good strength about the Lt shoulder with mild rotator cuff weakness. She does have periscapular weakness and postural abnormalities. Her knee has full ROM with patient reporting pain at end range of flexion. She has mild Lt knee weakness and bilateral hip weakness as well as aberrant SL squat mechanics. Patient will benefit from skilled PT to address the above stated deficits in order to optimize their function and assist in overall pain reduction.     OBJECTIVE IMPAIRMENTS: decreased activity tolerance, decreased knowledge of condition, difficulty walking, decreased strength, impaired UE functional use, improper body mechanics, postural dysfunction, and pain.   GOALS: Goals reviewed with patient? Yes  SHORT TERM GOALS: Target date: 11/02/2023     Patient will be independent and compliant with initial HEP.   Baseline: issued at eval  Goal status: INITIAL  2.  Patient will demonstrate proper form with step down on the LLE without pain to signify improvements in knee stability.  Baseline: poor form with SL squat  Goal status: INITIAL  3. Patient will report 50% improvement in her pain to reduce current functional limitations  Baseline: 6/10 at eval  Goal status: INITIAL   LONG TERM GOALS: Target date: 11/26/23  Patient will demonstrate 5/5 middle trap and 4/5 lower trap strength to improve postural stability.  Baseline: see above Goal status: INITIAL  2.  Patient will score at least 72% shoulder and 75% knee on FOTO to signify clinically meaningful improvement in functional abilities.   Baseline: see above Goal status: INITIAL  3.  Patient will demonstrate 5/5 bilateral hip strength to improve stability about the chain with prolonged walking activity.  Baseline: see above Goal status: INITIAL  4.   Patient will be independent with advanced home program, including appropriate gym exercises in order to assist in management of her chronic conditions.  Baseline: initial HEP issued  Goal status: INITIAL   PLAN:  PT FREQUENCY: 1-2x/week  PT DURATION: 6 weeks  PLANNED INTERVENTIONS: 97164- PT Re-evaluation, 97110-Therapeutic exercises, 97530- Therapeutic activity, 97112- Neuromuscular re-education, 97535- Self Care, [id number]- Manual therapy, [id number]- Aquatic Therapy, 97014- Electrical stimulation (unattended), [id number]- Electrical stimulation (manual), 97016- Vasopneumatic device, [id number]- Ionotophoresis 4mg /ml Dexamethasone, Patient/Family education, Taping, Dry Needling, Cryotherapy, and Moist heat  PLAN FOR NEXT SESSION: update HEP to include further postural strengthening, rotator cuff strengthening, CKC LE strengthening; TPDN as needed    Verlon Pischke P. Leonor Liv PT, MPH 10/17/23 8:03 AM

## 2023-10-19 ENCOUNTER — Encounter: Payer: Self-pay | Admitting: Rehabilitative and Restorative Service Providers"

## 2023-10-19 ENCOUNTER — Ambulatory Visit: Payer: 59 | Admitting: Rehabilitative and Restorative Service Providers"

## 2023-10-19 DIAGNOSIS — M6281 Muscle weakness (generalized): Secondary | ICD-10-CM

## 2023-10-19 DIAGNOSIS — M25512 Pain in left shoulder: Secondary | ICD-10-CM | POA: Diagnosis not present

## 2023-10-19 DIAGNOSIS — R293 Abnormal posture: Secondary | ICD-10-CM

## 2023-10-19 DIAGNOSIS — M25819 Other specified joint disorders, unspecified shoulder: Secondary | ICD-10-CM

## 2023-10-19 DIAGNOSIS — R29898 Other symptoms and signs involving the musculoskeletal system: Secondary | ICD-10-CM

## 2023-10-19 DIAGNOSIS — G8929 Other chronic pain: Secondary | ICD-10-CM

## 2023-10-19 NOTE — Therapy (Signed)
OUTPATIENT PHYSICAL THERAPY TREATMENT   Patient Name: Whitney Turner MRN: [id number] DOB:09-11-1976, 47 y.o., female Today's Date: 10/19/2023  END OF SESSION:  PT End of Session - 10/19/23 0719     Visit Number 3    Number of Visits 13    Date for PT Re-Evaluation 11/26/23    Authorization Type [employer]- 20 visits per year    PT Start Time 0715    PT Stop Time 0800    PT Time Calculation (min) 45 min    Activity Tolerance Patient tolerated treatment well             Past Medical History:  Diagnosis Date   Allergy    Heart murmur    Herpes    Past Surgical History:  Procedure Laterality Date   HERNIA REPAIR  1982   KNEE SURGERY  08/2011   TONSILECTOMY/ADENOIDECTOMY WITH MYRINGOTOMY  2000   Patient Active Problem List   Diagnosis Date Noted   Tibialis posterior tendinitis, left 03/10/2023   Mouth ulcer 03/08/2023   Sore throat 03/08/2023   Poor concentration 12/30/2020   Hyperactive behavior 12/30/2020   Chronic left shoulder pain 11/17/2020   Elevated LDL cholesterol level 12/24/2019   Right lower quadrant pain 12/24/2019   Right upper quadrant pain 12/24/2019   Effusion of right knee 06/25/2019   Hemorrhoids 12/04/2018   CIN I (cervical intraepithelial neoplasia I) 04/03/2018   Lateral epicondylitis of left elbow 02/15/2018   Left hand pain 02/23/2016   History of repair of ACL of Left knee 01/26/2016   Plantar fasciitis, bilateral 12/29/2015   Genital herpes 12/01/2012   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/2012   Allergic rhinitis 12/01/2012   Seasonal allergies 12/01/2012    PCP: Jomarie Longs, PA-C  REFERRING PROVIDER: Monica Becton, MD    REFERRING DIAG:  [phone] (ICD-10-CM) - History of repair of ACL  [phone] (ICD-10-CM) - Chronic left shoulder pain    THERAPY DIAG:  Chronic left shoulder pain  Muscle weakness (generalized)  Chronic pain of left knee  Other symptoms and signs involving the musculoskeletal system  Shoulder  impingement  Abnormal posture  Rationale for Evaluation and Treatment: Rehabilitation  ONSET DATE: knee- acute on chronic October 2024; shoulder- 2021  SUBJECTIVE:   SUBJECTIVE STATEMENT: Patient reports that she has some irritation in the L elbow today. Not sure if that is from the new exercises or from catching softball this week. Knee has been hurting and she feels that is related to arthritis. 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 Knee is irritated with she walks down stairs and trying the stair stepper at the gym twice this week. Shoulder was sore after needling but then felt better than before needling. Would like some new ideas for exercises.  EVAL: She reports multiple issues noting tightness in her neck/shoulders/Rt hip, Lt shoulder and Lt knee pain. She had had dry needling before and feels that this has been helpful in reducing her pain. She was hiking and thinks she might have hyperextended the left knee on 09/22/23. She thinks that she aggravated her meniscus. She denies any popping/clicking. It feels unstable and occasionally aches. 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 She has not been running or squatting since October, but wants to be able to these things. Patient reports during COVID she worked at [employer] and was lifting heavy bags overhead and thinks that when she  injured her shoulder. Sometimes the shoulder just hurts, primarily when she is sleeping (she sleeps on her stomach). She had injections in both the shoulder and knee at recent MD visit. The injections have helped to reduce her pain.  PERTINENT HISTORY: ACLR Lt 2011 per patient hamstring autograft  PAIN:  Are you having pain? Yes: NPRS scale: 5/10 Pain location: Lt shoulder Pain description: "it just hurts" Aggravating factors: catching softball,overhead lifting (shoulder); walking downhill (knee)  Relieving factors: rest,  injection  PRECAUTIONS: None   WEIGHT BEARING RESTRICTIONS: No  FALLS:  Has patient fallen in last 6 months? No  LIVING ENVIRONMENT: Lives with: lives with their family Lives in: House/apartment Stairs: Yes: Internal: multiple flights steps; on right going up Has following equipment at home: None  OCCUPATION: self-employed; sports official (softball, volleyball, and swimming); Printmaker   PATIENT GOALS: "I want to get stronger and get rid of tightness."   NEXT MD VISIT: 11/17/23  OBJECTIVE:  Note: Objective measures were completed at Evaluation unless otherwise noted.  DIAGNOSTIC FINDINGS: Lt shoulder MRI:   IMPRESSION: 1. Mild tendinosis of the supraspinatus and infraspinatus tendon without a tear. 2. Mild arthropathy of the acromioclavicular joint with marrow edema on either side of the joint.  PATIENT SURVEYS:  FOTO shoulder: 70% function to 72% predicted; knee: 67% function to 75% predicted   SENSATION: Not tested  EDEMA:  No obvious swelling about the Lt knee or shoulder   MUSCLE LENGTH: Hamstrings: WNL  POSTURE: rounded shoulders and forward head  PALPATION: TTP Lt patellar tendon  Shoulder not assessed   SPECIAL TESTS:  (+) trendelenberg   (-) Valgus, Varus, Anterior Drawer No time to complete shoulder special tests   FUNCTIONAL TESTS:  Squat: WNL SL squat: valgus, LOB, trunk flexion   GAIT: Distance walked: 10 ft  Assistive device utilized: None Level of assistance: Complete Independence Comments: WNL  LOWER EXTREMITY ROM:  Active ROM Right eval Left eval  Hip flexion    Hip extension    Hip abduction    Hip adduction    Hip internal rotation    Hip external rotation    Knee flexion Full  Full pain   Knee extension Full  Full   Ankle dorsiflexion    Ankle plantarflexion    Ankle inversion    Ankle eversion     (Blank rows = not tested)  LOWER EXTREMITY MMT:  MMT Right eval Left eval  Hip flexion 4+ 4  Hip  extension 4+ 4  Hip abduction 4 4  Hip adduction    Hip internal rotation    Hip external rotation    Knee flexion 5 4+  Knee extension 5 4+ pain   Ankle dorsiflexion    Ankle plantarflexion    Ankle inversion    Ankle eversion     (Blank rows = not tested) CERVICAL ROM:   Active ROM A/PROM (deg) eval  Flexion Full   Extension Full   Right lateral flexion   Left lateral flexion   Right rotation 70  Left rotation 78   (Blank rows = not tested)  UPPER EXTREMITY ROM:  Active ROM Right eval Left eval  Shoulder flexion Full  Full pulling in lat   Shoulder extension    Shoulder abduction Full  Full   Shoulder adduction    Shoulder extension    Shoulder internal rotation  Full   Shoulder external rotation  Full   Elbow flexion    Elbow extension  Wrist flexion    Wrist extension    Wrist ulnar deviation    Wrist radial deviation    Wrist pronation    Wrist supination     (Blank rows = not tested) UPPER EXTREMITY MMT:  MMT Right eval Left eval  Shoulder flexion 5 5  Shoulder extension    Shoulder abduction 5 5  Shoulder adduction    Shoulder extension    Shoulder internal rotation 5 5  Shoulder external rotation 5 4  Middle trapezius 4 4  Lower trapezius 3+ 3+  Elbow flexion    Elbow extension    Wrist flexion    Wrist extension    Wrist ulnar deviation    Wrist radial deviation    Wrist pronation    Wrist supination    Grip strength     (Blank rows = not tested)     [facility] Adult PT Treatment:                                                DATE: 10/19/23 Therapeutic Exercise: Standing Doorway stretch 3 positions 30 sec x 2  Scap squeeze with noodle red TB 10 sec x 5 Scap squeeze ER with noodle red TB 5 sec x 10 x 2 W with noodle ER with noodle red TB 5 sec x 10 x 2  ER ball on wall L UE 1 min x 2  Ball release work R posterior hip/back Supine  Hamstring stretch 30 sec x 2 Small range SLR with LE in ER 5 sec x 10  Quadruped  Cat cow Childs  pose Thread the needle     [facility] Adult PT Treatment:                                                DATE: 10/17/23 Therapeutic Exercise: Standing Doorway stretch 3 positions 30 sec x 2  Scap squeeze with noodle 10 sec x 5 Scap squeeze ER with noodle 5 sec x 5  ER ball on wall L UE 1 min x 2  Supine  Prolonged snow angel ~ 2 min  Manual Therapy: STM L shoulder girdle  Skilled palpation to assess response to manual work and DN  Trigger Point Dry-Needling  Treatment instructions: Expect mild to moderate muscle soreness. S/S of pneumothorax if dry needled over a lung field, and to seek immediate medical attention should they occur. Patient verbalized understanding of these instructions and education.  Patient Consent Given: Yes Education handout provided: Previously provided Muscles treated: upper trap; pec insertion at shoulder  Electrical stimulation performed: Yes Parameters:  mAmp current; intensity to patient tolerance  Treatment response/outcome: decreased palpable tightness   Neuromuscular re-ed: Postural correction   Modalities:   Moist heat L traps/shoulder x 10 min     [facility] Adult PT Treatment:                                                DATE: 10/12/23 Therapeutic Exercise: Demonstrated and issue initial HEP.    PATIENT EDUCATION:  Education details: see treatment; POC; assessment findings  Person  educated: Patient Education method: Explanation, Demonstration, Tactile cues, Verbal cues, and Handouts Education comprehension: verbalized understanding, returned demonstration, verbal cues required, and needs further education  HOME EXERCISE PROGRAM: Access Code: [email or web address] URL: [email or web address] Date: 10/19/2023 Prepared by: Corlis Leak  Exercises - Side Stepping with Resistance at Ankles  - 1 x daily - 7 x weekly - 2 sets - 10 reps - Shoulder External Rotation and Scapular Retraction with Resistance  - 1 x daily - 7 x weekly - 2 sets - 10  reps - Shoulder W - External Rotation with Resistance  - 2 x daily - 7 x weekly - 1-2 sets - 10 reps - 5 sec  hold - Supine Chest Stretch on Foam Roll  - 2 x daily - 7 x weekly - 1 sets - 1 reps - 2-5 min  sec  hold - Hooklying Hamstring Stretch with Strap  - 2 x daily - 7 x weekly - 1 sets - 3 reps - 30 sec  hold - Straight Leg Raise with External Rotation  - 2 x daily - 7 x weekly - 1 sets - 10 reps - 3-5 sec  hold - Supine Bridge with Mini Swiss Ball Between Knees  - 1 x daily - 7 x weekly - 1-2 sets - 10 reps - 5 sec  hold - Cat Cow  - 2 x daily - 7 x weekly - 1 sets - 5-10 reps - 3-5 sec  hold - Cat Cow to Child's Pose  - 2 x daily - 7 x weekly - 1 sets - 3 reps - 30 sec  hold - Child's Pose with Thread the Needle  - 2 x daily - 7 x weekly - 1 sets - 3 reps - 30 sec  hold   ASSESSMENT:  CLINICAL IMPRESSION: Patient demonstrates forward posture with shoulders elevated, head of the humerus anterior in orientation. She has tightness through upper trap; pecs; biceps. Continued posterior shoulder girdle activation exercises and prolonged stretch for pecs(for home). Note tightness in the R > Lt crest of pelvis at origin of the lats. Patient has tightness in medial and lateral hamstring and mild tenderness medial knee. She has increased medial knee pain since hiking and worked out on the stair climber at [employer] twice for 10 min. Added specific exercises for L knee and stretches for lats to address shoulder tightness. Encouraged consistent postural correction and exercises. Patient has asymmetrical postures and movements with umpiring softball often working many games in a few days.    EVAL: Patient is a 47 y.o. female who was seen today for physical therapy evaluation and treatment for chronic Lt knee and Lt shoulder pain. Upon assessment she is noted to have full Lt shoulder AROM reporting pulling/tightness at end range of shoulder flexion. Overall good strength about the Lt shoulder with mild  rotator cuff weakness. She does have periscapular weakness and postural abnormalities. Her knee has full ROM with patient reporting pain at end range of flexion. She has mild Lt knee weakness and bilateral hip weakness as well as aberrant SL squat mechanics. Patient will benefit from skilled PT to address the above stated deficits in order to optimize their function and assist in overall pain reduction.     OBJECTIVE IMPAIRMENTS: decreased activity tolerance, decreased knowledge of condition, difficulty walking, decreased strength, impaired UE functional use, improper body mechanics, postural dysfunction, and pain.   GOALS: Goals reviewed with patient? Yes  SHORT TERM GOALS: Target date: 11/02/2023  Patient will be independent and compliant with initial HEP.   Baseline: issued at eval  Goal status: INITIAL  2.  Patient will demonstrate proper form with step down on the LLE without pain to signify improvements in knee stability.  Baseline: poor form with SL squat  Goal status: INITIAL  3. Patient will report 50% improvement in her pain to reduce current functional limitations  Baseline: 6/10 at eval  Goal status: INITIAL   LONG TERM GOALS: Target date: 11/26/23  Patient will demonstrate 5/5 middle trap and 4/5 lower trap strength to improve postural stability.  Baseline: see above Goal status: INITIAL  2.  Patient will score at least 72% shoulder and 75% knee on FOTO to signify clinically meaningful improvement in functional abilities.   Baseline: see above Goal status: INITIAL  3.  Patient will demonstrate 5/5 bilateral hip strength to improve stability about the chain with prolonged walking activity.  Baseline: see above Goal status: INITIAL  4.  Patient will be independent with advanced home program, including appropriate gym exercises in order to assist in management of her chronic conditions.  Baseline: initial HEP issued  Goal status: INITIAL   PLAN:  PT  FREQUENCY: 1-2x/week  PT DURATION: 6 weeks  PLANNED INTERVENTIONS: 97164- PT Re-evaluation, 97110-Therapeutic exercises, 97530- Therapeutic activity, 97112- Neuromuscular re-education, 97535- Self Care, 16109- Manual therapy, [id number]- Aquatic Therapy, 97014- Electrical stimulation (unattended), [id number]- Electrical stimulation (manual), 97016- Vasopneumatic device, [id number]- Ionotophoresis 4mg /ml Dexamethasone, Patient/Family education, Taping, Dry Needling, Cryotherapy, and Moist heat  PLAN FOR NEXT SESSION: update HEP to include further postural strengthening, rotator cuff strengthening, CKC LE strengthening; TPDN as needed    Lamoyne Hessel P. Leonor Liv PT, MPH 10/19/23 8:47 AM

## 2023-10-25 ENCOUNTER — Ambulatory Visit: Payer: 59 | Attending: Sports Medicine

## 2023-10-25 DIAGNOSIS — M25562 Pain in left knee: Secondary | ICD-10-CM | POA: Diagnosis present

## 2023-10-25 DIAGNOSIS — G8929 Other chronic pain: Secondary | ICD-10-CM | POA: Diagnosis present

## 2023-10-25 DIAGNOSIS — M25512 Pain in left shoulder: Secondary | ICD-10-CM | POA: Insufficient documentation

## 2023-10-25 DIAGNOSIS — M6281 Muscle weakness (generalized): Secondary | ICD-10-CM | POA: Diagnosis present

## 2023-10-25 DIAGNOSIS — R29898 Other symptoms and signs involving the musculoskeletal system: Secondary | ICD-10-CM | POA: Diagnosis present

## 2023-10-25 NOTE — Therapy (Signed)
OUTPATIENT PHYSICAL THERAPY TREATMENT   Patient Name: Radiance Devoto MRN: [id number] DOB:08-31-1976, 47 y.o., female Today's Date: 10/25/2023  END OF SESSION:  PT End of Session - 10/25/23 1020     Visit Number 4    Number of Visits 13    Date for PT Re-Evaluation 11/26/23    Authorization Type [employer]- 20 visits per year    PT Start Time 1019    PT Stop Time 1100    PT Time Calculation (min) 41 min    Activity Tolerance Patient tolerated treatment well             Past Medical History:  Diagnosis Date   Allergy    Heart murmur    Herpes    Past Surgical History:  Procedure Laterality Date   HERNIA REPAIR  1982   KNEE SURGERY  08/2011   TONSILECTOMY/ADENOIDECTOMY WITH MYRINGOTOMY  2000   Patient Active Problem List   Diagnosis Date Noted   Tibialis posterior tendinitis, left 03/10/2023   Mouth ulcer 03/08/2023   Sore throat 03/08/2023   Poor concentration 12/30/2020   Hyperactive behavior 12/30/2020   Chronic left shoulder pain 11/17/2020   Elevated LDL cholesterol level 12/24/2019   Right lower quadrant pain 12/24/2019   Right upper quadrant pain 12/24/2019   Effusion of right knee 06/25/2019   Hemorrhoids 12/04/2018   CIN I (cervical intraepithelial neoplasia I) 04/03/2018   Lateral epicondylitis of left elbow 02/15/2018   Left hand pain 02/23/2016   History of repair of ACL of Left knee 01/26/2016   Plantar fasciitis, bilateral 12/29/2015   Genital herpes 12/01/2012   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/2012   Allergic rhinitis 12/01/2012   Seasonal allergies 12/01/2012    PCP: Jomarie Longs, PA-C  REFERRING PROVIDER: Monica Becton, MD    REFERRING DIAG:  [phone] (ICD-10-CM) - History of repair of ACL  [phone] (ICD-10-CM) - Chronic left shoulder pain    THERAPY DIAG:  Chronic left shoulder pain  Muscle weakness (generalized)  Chronic pain of left knee  Other symptoms and signs involving the musculoskeletal system  Rationale for Evaluation  and Treatment: Rehabilitation  ONSET DATE: knee- acute on chronic October 2024; shoulder- 2021  SUBJECTIVE:   SUBJECTIVE STATEMENT: Tried the stair climber at the gym and that made the knee really sore. Shoulder has been doing ok with the stretches.   EVAL: She reports multiple issues noting tightness in her neck/shoulders/Rt hip, Lt shoulder and Lt knee pain. She had had dry needling before and feels that this has been helpful in reducing her pain. She was hiking and thinks she might have hyperextended the left knee on 09/22/23. She thinks that she aggravated her meniscus. She denies any popping/clicking. It feels unstable and occasionally aches. 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 She has not been running or squatting since October, but wants to be able to these things. Patient reports during COVID she worked at [employer] and was lifting heavy bags overhead and thinks that when she injured her shoulder. Sometimes the shoulder just hurts, primarily when she is sleeping (she sleeps on her stomach). She had injections in both the shoulder and knee at recent MD visit. The injections have helped to reduce her pain.  PERTINENT HISTORY: ACLR Lt 2011 per patient hamstring autograft  PAIN:  Are you having pain? Yes: NPRS scale: 7/10 Pain location: Lt posterior knee  Pain description: sore Aggravating factors: pressure,palpation  Relieving factors: rest,  injection  PRECAUTIONS: None   WEIGHT BEARING RESTRICTIONS: No  FALLS:  Has patient fallen in last 6 months? No  LIVING ENVIRONMENT: Lives with: lives with their family Lives in: House/apartment Stairs: Yes: Internal: multiple flights steps; on right going up Has following equipment at home: None  OCCUPATION: self-employed; sports official (softball, volleyball, and swimming); Printmaker   PATIENT GOALS: "I want to get stronger and get rid of tightness."   NEXT MD VISIT:  11/17/23  OBJECTIVE:  Note: Objective measures were completed at Evaluation unless otherwise noted.  DIAGNOSTIC FINDINGS: Lt shoulder MRI:   IMPRESSION: 1. Mild tendinosis of the supraspinatus and infraspinatus tendon without a tear. 2. Mild arthropathy of the acromioclavicular joint with marrow edema on either side of the joint.  PATIENT SURVEYS:  FOTO shoulder: 70% function to 72% predicted; knee: 67% function to 75% predicted   SENSATION: Not tested  EDEMA:  No obvious swelling about the Lt knee or shoulder   MUSCLE LENGTH: Hamstrings: WNL  POSTURE: rounded shoulders and forward head  PALPATION: TTP Lt patellar tendon  Shoulder not assessed   SPECIAL TESTS:  (+) trendelenberg   (-) Valgus, Varus, Anterior Drawer No time to complete shoulder special tests   FUNCTIONAL TESTS:  Squat: WNL SL squat: valgus, LOB, trunk flexion   GAIT: Distance walked: 10 ft  Assistive device utilized: None Level of assistance: Complete Independence Comments: WNL  LOWER EXTREMITY ROM:  Active ROM Right eval Left eval  Hip flexion    Hip extension    Hip abduction    Hip adduction    Hip internal rotation    Hip external rotation    Knee flexion Full  Full pain   Knee extension Full  Full   Ankle dorsiflexion    Ankle plantarflexion    Ankle inversion    Ankle eversion     (Blank rows = not tested)  LOWER EXTREMITY MMT:  MMT Right eval Left eval  Hip flexion 4+ 4  Hip extension 4+ 4  Hip abduction 4 4  Hip adduction    Hip internal rotation    Hip external rotation    Knee flexion 5 4+  Knee extension 5 4+ pain   Ankle dorsiflexion    Ankle plantarflexion    Ankle inversion    Ankle eversion     (Blank rows = not tested) CERVICAL ROM:   Active ROM A/PROM (deg) eval  Flexion Full   Extension Full   Right lateral flexion   Left lateral flexion   Right rotation 70  Left rotation 78   (Blank rows = not tested)  UPPER EXTREMITY ROM:  Active ROM  Right eval Left eval  Shoulder flexion Full  Full pulling in lat   Shoulder extension    Shoulder abduction Full  Full   Shoulder adduction    Shoulder extension    Shoulder internal rotation  Full   Shoulder external rotation  Full   Elbow flexion    Elbow extension    Wrist flexion    Wrist extension    Wrist ulnar deviation    Wrist radial deviation    Wrist pronation    Wrist supination     (Blank rows = not tested) UPPER EXTREMITY MMT:  MMT Right eval Left eval  Shoulder flexion 5 5  Shoulder extension    Shoulder abduction 5 5  Shoulder adduction    Shoulder extension    Shoulder internal rotation 5 5  Shoulder external rotation  5 4  Middle trapezius 4 4  Lower trapezius 3+ 3+  Elbow flexion    Elbow extension    Wrist flexion    Wrist extension    Wrist ulnar deviation    Wrist radial deviation    Wrist pronation    Wrist supination    Grip strength     (Blank rows = not tested)   [facility] Adult PT Treatment:                                                DATE: 10/25/23 Therapeutic Exercise: HS curl on physioball 2 x 10  Calf stretch on wedge x 1 minute  Leg press 1 x 10 @ 195, 1 x 10 @ 245  Reverse lunge on slider x 10 each SL wall squat with physioball x 10 each  Fire hydrant blue band x 10 each  Updated HEP  Manual Therapy: Skilled palpation of trigger points Trigger Point Dry Needling Treatment: Pre-treatment instruction: Patient instructed on dry needling rationale, procedures, and possible side effects including pain during treatment (achy,cramping feeling), bruising, drop of blood, lightheadedness, nausea, sweating. Patient Consent Given: Yes Education handout provided: Previously provided Muscles treated: Lt gastroc/soleus and bicep femoris   Treatment response/outcome: Twitch response elicited and Palpable decrease in muscle tension Post-treatment instructions: Patient instructed to expect possible mild to moderate muscle soreness later today  and/or tomorrow. Patient instructed in methods to reduce muscle soreness and to continue prescribed HEP. If patient was dry needled over the lung field, patient was instructed on signs and symptoms of pneumothorax and, however unlikely, to see immediate medical attention should they occur. Patient was also educated on signs and symptoms of infection and to seek medical attention should they occur. Patient verbalized understanding of these instructions and education.     [facility] Adult PT Treatment:                                                DATE: 10/19/23 Therapeutic Exercise: Standing Doorway stretch 3 positions 30 sec x 2  Scap squeeze with noodle red TB 10 sec x 5 Scap squeeze ER with noodle red TB 5 sec x 10 x 2 W with noodle ER with noodle red TB 5 sec x 10 x 2  ER ball on wall L UE 1 min x 2  Ball release work R posterior hip/back Supine  Hamstring stretch 30 sec x 2 Small range SLR with LE in ER 5 sec x 10  Quadruped  Cat cow Childs pose Thread the needle     [facility] Adult PT Treatment:                                                DATE: 10/17/23 Therapeutic Exercise: Standing Doorway stretch 3 positions 30 sec x 2  Scap squeeze with noodle 10 sec x 5 Scap squeeze ER with noodle 5 sec x 5  ER ball on wall L UE 1 min x 2  Supine  Prolonged snow angel ~ 2 min  Manual Therapy: STM L shoulder girdle  Skilled palpation  to assess response to manual work and DN  Trigger Point Dry-Needling  Treatment instructions: Expect mild to moderate muscle soreness. S/S of pneumothorax if dry needled over a lung field, and to seek immediate medical attention should they occur. Patient verbalized understanding of these instructions and education.  Patient Consent Given: Yes Education handout provided: Previously provided Muscles treated: upper trap; pec insertion at shoulder  Electrical stimulation performed: Yes Parameters:  mAmp current; intensity to patient tolerance  Treatment  response/outcome: decreased palpable tightness   Neuromuscular re-ed: Postural correction   Modalities:   Moist heat L traps/shoulder x 10 min   PATIENT EDUCATION:  Education details: HEP update  Person educated: Patient Education method: Explanation, Demonstration, Tactile cues, Verbal cues, and Handouts Education comprehension: verbalized understanding, returned demonstration, verbal cues required, and needs further education  HOME EXERCISE PROGRAM: Access Code: [email or web address] URL: [email or web address] Date: 10/25/2023 Prepared by: Letitia Libra  Exercises - Side Stepping with Resistance at Ankles  - 1 x daily - 7 x weekly - 2 sets - 10 reps - Shoulder External Rotation and Scapular Retraction with Resistance  - 1 x daily - 7 x weekly - 2 sets - 10 reps - Shoulder W - External Rotation with Resistance  - 2 x daily - 7 x weekly - 1-2 sets - 10 reps - 5 sec  hold - Supine Chest Stretch on Foam Roll  - 2 x daily - 7 x weekly - 1 sets - 1 reps - 2-5 min  sec  hold - Hooklying Hamstring Stretch with Strap  - 2 x daily - 7 x weekly - 1 sets - 3 reps - 30 sec  hold - Straight Leg Raise with External Rotation  - 2 x daily - 7 x weekly - 1 sets - 10 reps - 3-5 sec  hold - Supine Bridge with Mini Swiss Ball Between Knees  - 1 x daily - 7 x weekly - 1-2 sets - 10 reps - 5 sec  hold - Cat Cow  - 2 x daily - 7 x weekly - 1 sets - 5-10 reps - 3-5 sec  hold - Cat Cow to Child's Pose  - 2 x daily - 7 x weekly - 1 sets - 3 reps - 30 sec  hold - Child's Pose with Thread the Needle  - 2 x daily - 7 x weekly - 1 sets - 3 reps - 30 sec  hold - Reverse Lunge on Slider  - 1 x daily - 7 x weekly - 2 sets - 10 reps - Single Leg Quarter Squat with Swiss Ball at [employer]  - 1 x daily - 7 x weekly - 2 sets - 10 reps - Supine Hamstring Curl on Swiss Ball  - 1 x daily - 7 x weekly - 2 sets - 10 reps - Quadruped Hip Abduction with Resistance Loop  - 1 x daily - 7 x weekly - 2 sets - 10  reps   ASSESSMENT:  CLINICAL IMPRESSION: Focused on the Lt knee today progressing strengthening with good tolerance. TPDN performed to Lt hamstring and gastroc with patient reporting soreness post-intervention as expected. Challenged with closed chain strengthening with patient having difficulty controlling for pelvic drop indicative of gluteal weakness. HEP was updated to include further strengthening.    EVAL: Patient is a 47 y.o. female who was seen today for physical therapy evaluation and treatment for chronic Lt knee and Lt shoulder pain. Upon assessment she is noted to have full Lt  shoulder AROM reporting pulling/tightness at end range of shoulder flexion. Overall good strength about the Lt shoulder with mild rotator cuff weakness. She does have periscapular weakness and postural abnormalities. Her knee has full ROM with patient reporting pain at end range of flexion. She has mild Lt knee weakness and bilateral hip weakness as well as aberrant SL squat mechanics. Patient will benefit from skilled PT to address the above stated deficits in order to optimize their function and assist in overall pain reduction.     OBJECTIVE IMPAIRMENTS: decreased activity tolerance, decreased knowledge of condition, difficulty walking, decreased strength, impaired UE functional use, improper body mechanics, postural dysfunction, and pain.   GOALS: Goals reviewed with patient? Yes  SHORT TERM GOALS: Target date: 11/02/2023     Patient will be independent and compliant with initial HEP.   Baseline: issued at eval  Goal status: INITIAL  2.  Patient will demonstrate proper form with step down on the LLE without pain to signify improvements in knee stability.  Baseline: poor form with SL squat  Goal status: INITIAL  3. Patient will report 50% improvement in her pain to reduce current functional limitations  Baseline: 6/10 at eval  Goal status: INITIAL   LONG TERM GOALS: Target date:  11/26/23  Patient will demonstrate 5/5 middle trap and 4/5 lower trap strength to improve postural stability.  Baseline: see above Goal status: INITIAL  2.  Patient will score at least 72% shoulder and 75% knee on FOTO to signify clinically meaningful improvement in functional abilities.   Baseline: see above Goal status: INITIAL  3.  Patient will demonstrate 5/5 bilateral hip strength to improve stability about the chain with prolonged walking activity.  Baseline: see above Goal status: INITIAL  4.  Patient will be independent with advanced home program, including appropriate gym exercises in order to assist in management of her chronic conditions.  Baseline: initial HEP issued  Goal status: INITIAL   PLAN:  PT FREQUENCY: 1-2x/week  PT DURATION: 6 weeks  PLANNED INTERVENTIONS: 97164- PT Re-evaluation, 97110-Therapeutic exercises, 97530- Therapeutic activity, [id number]- Neuromuscular re-education, 97535- Self Care, 72536- Manual therapy, [id number]- Aquatic Therapy, 97014- Electrical stimulation (unattended), [id number]- Electrical stimulation (manual), 97016- Vasopneumatic device, [id number]- Ionotophoresis 4mg /ml Dexamethasone, Patient/Family education, Taping, Dry Needling, Cryotherapy, and Moist heat  PLAN FOR NEXT SESSION: update HEP to include further postural strengthening, rotator cuff strengthening, CKC LE strengthening; TPDN as needed   Letitia Libra, PT, DPT, ATC 10/25/23 12:41 PM

## 2023-10-29 ENCOUNTER — Other Ambulatory Visit: Payer: Self-pay | Admitting: Sports Medicine

## 2023-10-29 DIAGNOSIS — M7542 Impingement syndrome of left shoulder: Secondary | ICD-10-CM

## 2023-10-31 ENCOUNTER — Ambulatory Visit: Payer: 59

## 2023-10-31 ENCOUNTER — Ambulatory Visit: Payer: 59 | Admitting: Physician Assistant

## 2023-10-31 DIAGNOSIS — G8929 Other chronic pain: Secondary | ICD-10-CM

## 2023-10-31 DIAGNOSIS — M6281 Muscle weakness (generalized): Secondary | ICD-10-CM

## 2023-10-31 DIAGNOSIS — R29898 Other symptoms and signs involving the musculoskeletal system: Secondary | ICD-10-CM

## 2023-10-31 DIAGNOSIS — M25512 Pain in left shoulder: Secondary | ICD-10-CM | POA: Diagnosis not present

## 2023-10-31 NOTE — Therapy (Signed)
OUTPATIENT PHYSICAL THERAPY TREATMENT   Patient Name: Whitney Turner MRN: [id number] DOB:07/14/1976, 47 y.o., female Today's Date: 10/31/2023  END OF SESSION:  PT End of Session - 10/31/23 0930     Visit Number 5    Number of Visits 13    Date for PT Re-Evaluation 11/26/23    Authorization Type [employer]- 20 visits per year    PT Start Time 0930    PT Stop Time 1013    PT Time Calculation (min) 43 min    Activity Tolerance Patient tolerated treatment well              Past Medical History:  Diagnosis Date   Allergy    Heart murmur    Herpes    Past Surgical History:  Procedure Laterality Date   HERNIA REPAIR  1982   KNEE SURGERY  08/2011   TONSILECTOMY/ADENOIDECTOMY WITH MYRINGOTOMY  2000   Patient Active Problem List   Diagnosis Date Noted   Tibialis posterior tendinitis, left 03/10/2023   Mouth ulcer 03/08/2023   Sore throat 03/08/2023   Poor concentration 12/30/2020   Hyperactive behavior 12/30/2020   Chronic left shoulder pain 11/17/2020   Elevated LDL cholesterol level 12/24/2019   Right lower quadrant pain 12/24/2019   Right upper quadrant pain 12/24/2019   Effusion of right knee 06/25/2019   Hemorrhoids 12/04/2018   CIN I (cervical intraepithelial neoplasia I) 04/03/2018   Lateral epicondylitis of left elbow 02/15/2018   Left hand pain 02/23/2016   History of repair of ACL of Left knee 01/26/2016   Plantar fasciitis, bilateral 12/29/2015   Genital herpes 12/01/2012   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/2012   Allergic rhinitis 12/01/2012   Seasonal allergies 12/01/2012    PCP: Jomarie Longs, PA-C  REFERRING PROVIDER: Monica Becton, MD    REFERRING DIAG:  [phone] (ICD-10-CM) - History of repair of ACL  [phone] (ICD-10-CM) - Chronic left shoulder pain    THERAPY DIAG:  Chronic left shoulder pain  Muscle weakness (generalized)  Chronic pain of left knee  Other symptoms and signs involving the musculoskeletal system  Rationale for  Evaluation and Treatment: Rehabilitation  ONSET DATE: knee- acute on chronic October 2024; shoulder- 2021  SUBJECTIVE:   SUBJECTIVE STATEMENT: She did some of her exercises and felt some soreness. She reports she is feeling good right now without pain.   EVAL: She reports multiple issues noting tightness in her neck/shoulders/Rt hip, Lt shoulder and Lt knee pain. She had had dry needling before and feels that this has been helpful in reducing her pain. She was hiking and thinks she might have hyperextended the left knee on 09/22/23. She thinks that she aggravated her meniscus. She denies any popping/clicking. It feels unstable and occasionally aches. 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 She has not been running or squatting since October, but wants to be able to these things. Patient reports during COVID she worked at [employer] and was lifting heavy bags overhead and thinks that when she injured her shoulder. Sometimes the shoulder just hurts, primarily when she is sleeping (she sleeps on her stomach). She had injections in both the shoulder and knee at recent MD visit. The injections have helped to reduce her pain.  PERTINENT HISTORY: ACLR Lt 2011 per patient hamstring autograft  PAIN:  Are you having pain? Yes: NPRS scale: none currently; at worst 7 (knee), 6 at worst (shoulder)/10 Pain location: Lt knee, Lt shoulder  Pain  description: sore Aggravating factors: pressure,palpation, heavy lifting  Relieving factors: rest, injection  PRECAUTIONS: None   WEIGHT BEARING RESTRICTIONS: No  FALLS:  Has patient fallen in last 6 months? No  LIVING ENVIRONMENT: Lives with: lives with their family Lives in: House/apartment Stairs: Yes: Internal: multiple flights steps; on right going up Has following equipment at home: None  OCCUPATION: self-employed; sports official (softball, volleyball, and swimming); Printmaker   PATIENT GOALS: "I want  to get stronger and get rid of tightness."   NEXT MD VISIT: 11/17/23  OBJECTIVE:  Note: Objective measures were completed at Evaluation unless otherwise noted.  DIAGNOSTIC FINDINGS: Lt shoulder MRI:   IMPRESSION: 1. Mild tendinosis of the supraspinatus and infraspinatus tendon without a tear. 2. Mild arthropathy of the acromioclavicular joint with marrow edema on either side of the joint.  PATIENT SURVEYS:  FOTO shoulder: 70% function to 72% predicted; knee: 67% function to 75% predicted   SENSATION: Not tested  EDEMA:  No obvious swelling about the Lt knee or shoulder   MUSCLE LENGTH: Hamstrings: WNL  POSTURE: rounded shoulders and forward head  PALPATION: TTP Lt patellar tendon  Shoulder not assessed   10/31/23: Lt anterior rotated innominate   SPECIAL TESTS:  (+) trendelenberg   (-) Valgus, Varus, Anterior Drawer No time to complete shoulder special tests   FUNCTIONAL TESTS:  Squat: WNL SL squat: valgus, LOB, trunk flexion   GAIT: Distance walked: 10 ft  Assistive device utilized: None Level of assistance: Complete Independence Comments: WNL  LOWER EXTREMITY ROM:  Active ROM Right eval Left eval  Hip flexion    Hip extension    Hip abduction    Hip adduction    Hip internal rotation    Hip external rotation    Knee flexion Full  Full pain   Knee extension Full  Full   Ankle dorsiflexion    Ankle plantarflexion    Ankle inversion    Ankle eversion     (Blank rows = not tested)  LOWER EXTREMITY MMT:  MMT Right eval Left eval  Hip flexion 4+ 4  Hip extension 4+ 4  Hip abduction 4 4  Hip adduction    Hip internal rotation    Hip external rotation    Knee flexion 5 4+  Knee extension 5 4+ pain   Ankle dorsiflexion    Ankle plantarflexion    Ankle inversion    Ankle eversion     (Blank rows = not tested) CERVICAL ROM:   Active ROM A/PROM (deg) eval  Flexion Full   Extension Full   Right lateral flexion   Left lateral flexion    Right rotation 70  Left rotation 78   (Blank rows = not tested)  UPPER EXTREMITY ROM:  Active ROM Right eval Left eval  Shoulder flexion Full  Full pulling in lat   Shoulder extension    Shoulder abduction Full  Full   Shoulder adduction    Shoulder extension    Shoulder internal rotation  Full   Shoulder external rotation  Full   Elbow flexion    Elbow extension    Wrist flexion    Wrist extension    Wrist ulnar deviation    Wrist radial deviation    Wrist pronation    Wrist supination     (Blank rows = not tested) UPPER EXTREMITY MMT:  MMT Right eval Left eval  Shoulder flexion 5 5  Shoulder extension    Shoulder abduction 5 5  Shoulder  adduction    Shoulder extension    Shoulder internal rotation 5 5  Shoulder external rotation 5 4  Middle trapezius 4 4  Lower trapezius 3+ 3+  Elbow flexion    Elbow extension    Wrist flexion    Wrist extension    Wrist ulnar deviation    Wrist radial deviation    Wrist pronation    Wrist supination    Grip strength     (Blank rows = not tested)  [facility] Adult PT Treatment:                                                DATE: 10/31/23 Therapeutic Exercise: Lat pull 2 x 10 @ 25 lbs Bent over row 2 x 15 @ 10 lbs Super man with arm pull down 2 x 10  Sumo squat x 10 @ 20 lbs, x 10 @ 40 lbs Curtsy lunge x 10 each  Split squat x 10 each  Updated HEP  Manual Therapy: Muscle energy technique to improve pelvic symmetry    [facility] Adult PT Treatment:                                                DATE: 10/25/23 Therapeutic Exercise: HS curl on physioball 2 x 10  Calf stretch on wedge x 1 minute  Leg press 1 x 10 @ 195, 1 x 10 @ 245  Reverse lunge on slider x 10 each SL wall squat with physioball x 10 each  Fire hydrant blue band x 10 each  Updated HEP  Manual Therapy: Skilled palpation of trigger points Trigger Point Dry Needling Treatment: Pre-treatment instruction: Patient instructed on dry needling rationale,  procedures, and possible side effects including pain during treatment (achy,cramping feeling), bruising, drop of blood, lightheadedness, nausea, sweating. Patient Consent Given: Yes Education handout provided: Previously provided Muscles treated: Lt gastroc/soleus and bicep femoris   Treatment response/outcome: Twitch response elicited and Palpable decrease in muscle tension Post-treatment instructions: Patient instructed to expect possible mild to moderate muscle soreness later today and/or tomorrow. Patient instructed in methods to reduce muscle soreness and to continue prescribed HEP. If patient was dry needled over the lung field, patient was instructed on signs and symptoms of pneumothorax and, however unlikely, to see immediate medical attention should they occur. Patient was also educated on signs and symptoms of infection and to seek medical attention should they occur. Patient verbalized understanding of these instructions and education.     [facility] Adult PT Treatment:                                                DATE: 10/19/23 Therapeutic Exercise: Standing Doorway stretch 3 positions 30 sec x 2  Scap squeeze with noodle red TB 10 sec x 5 Scap squeeze ER with noodle red TB 5 sec x 10 x 2 W with noodle ER with noodle red TB 5 sec x 10 x 2  ER ball on wall L UE 1 min x 2  Ball release work R posterior hip/back Supine  Hamstring stretch 30 sec x  2 Small range SLR with LE in ER 5 sec x 10  Quadruped  Cat cow Childs pose Thread the needle     [facility] Adult PT Treatment:                                                DATE: 10/17/23 Therapeutic Exercise: Standing Doorway stretch 3 positions 30 sec x 2  Scap squeeze with noodle 10 sec x 5 Scap squeeze ER with noodle 5 sec x 5  ER ball on wall L UE 1 min x 2  Supine  Prolonged snow angel ~ 2 min  Manual Therapy: STM L shoulder girdle  Skilled palpation to assess response to manual work and DN  Trigger Point Dry-Needling   Treatment instructions: Expect mild to moderate muscle soreness. S/S of pneumothorax if dry needled over a lung field, and to seek immediate medical attention should they occur. Patient verbalized understanding of these instructions and education.  Patient Consent Given: Yes Education handout provided: Previously provided Muscles treated: upper trap; pec insertion at shoulder  Electrical stimulation performed: Yes Parameters:  mAmp current; intensity to patient tolerance  Treatment response/outcome: decreased palpable tightness   Neuromuscular re-ed: Postural correction   Modalities:   Moist heat L traps/shoulder x 10 min   PATIENT EDUCATION:  Education details: HEP update  Person educated: Patient Education method: Explanation, Demonstration, Tactile cues, Verbal cues, and Handouts Education comprehension: verbalized understanding, returned demonstration, verbal cues required, and needs further education  HOME EXERCISE PROGRAM: Access Code: [email or web address] URL: [email or web address] Date: 10/25/2023 Prepared by: Letitia Libra  Exercises - Side Stepping with Resistance at Ankles  - 1 x daily - 7 x weekly - 2 sets - 10 reps - Shoulder External Rotation and Scapular Retraction with Resistance  - 1 x daily - 7 x weekly - 2 sets - 10 reps - Shoulder W - External Rotation with Resistance  - 2 x daily - 7 x weekly - 1-2 sets - 10 reps - 5 sec  hold - Supine Chest Stretch on Foam Roll  - 2 x daily - 7 x weekly - 1 sets - 1 reps - 2-5 min  sec  hold - Hooklying Hamstring Stretch with Strap  - 2 x daily - 7 x weekly - 1 sets - 3 reps - 30 sec  hold - Straight Leg Raise with External Rotation  - 2 x daily - 7 x weekly - 1 sets - 10 reps - 3-5 sec  hold - Supine Bridge with Mini Swiss Ball Between Knees  - 1 x daily - 7 x weekly - 1-2 sets - 10 reps - 5 sec  hold - Cat Cow  - 2 x daily - 7 x weekly - 1 sets - 5-10 reps - 3-5 sec  hold - Cat Cow to Child's Pose  - 2 x daily - 7 x  weekly - 1 sets - 3 reps - 30 sec  hold - Child's Pose with Thread the Needle  - 2 x daily - 7 x weekly - 1 sets - 3 reps - 30 sec  hold - Reverse Lunge on Slider  - 1 x daily - 7 x weekly - 2 sets - 10 reps - Single Leg Quarter Squat with Swiss Ball at [employer]  - 1 x daily - 7 x weekly - 2 sets -  10 reps - Supine Hamstring Curl on Swiss Ball  - 1 x daily - 7 x weekly - 2 sets - 10 reps - Quadruped Hip Abduction with Resistance Loop  - 1 x daily - 7 x weekly - 2 sets - 10 reps   ASSESSMENT:  CLINICAL IMPRESSION: Patient tolerated session well today focusing on periscapular strengthening and LE CKC strengthening. Requires occasional tactile cues to reduce excessive upper trap engagement with periscapular strengthening. With lunge/split squat she has tendency to maintain foot in IR requiring cues to correct. She was noted to have Lt anterior rotated innominate that was easily corrected with muscle energy technique.    EVAL: Patient is a 47 y.o. female who was seen today for physical therapy evaluation and treatment for chronic Lt knee and Lt shoulder pain. Upon assessment she is noted to have full Lt shoulder AROM reporting pulling/tightness at end range of shoulder flexion. Overall good strength about the Lt shoulder with mild rotator cuff weakness. She does have periscapular weakness and postural abnormalities. Her knee has full ROM with patient reporting pain at end range of flexion. She has mild Lt knee weakness and bilateral hip weakness as well as aberrant SL squat mechanics. Patient will benefit from skilled PT to address the above stated deficits in order to optimize their function and assist in overall pain reduction.     OBJECTIVE IMPAIRMENTS: decreased activity tolerance, decreased knowledge of condition, difficulty walking, decreased strength, impaired UE functional use, improper body mechanics, postural dysfunction, and pain.   GOALS: Goals reviewed with patient? Yes  SHORT TERM GOALS:  Target date: 11/02/2023     Patient will be independent and compliant with initial HEP.   Baseline: issued at eval  Goal status: INITIAL  2.  Patient will demonstrate proper form with step down on the LLE without pain to signify improvements in knee stability.  Baseline: poor form with SL squat  Goal status: INITIAL  3. Patient will report 50% improvement in her pain to reduce current functional limitations  Baseline: 6/10 at eval  Goal status: INITIAL   LONG TERM GOALS: Target date: 11/26/23  Patient will demonstrate 5/5 middle trap and 4/5 lower trap strength to improve postural stability.  Baseline: see above Goal status: INITIAL  2.  Patient will score at least 72% shoulder and 75% knee on FOTO to signify clinically meaningful improvement in functional abilities.   Baseline: see above Goal status: INITIAL  3.  Patient will demonstrate 5/5 bilateral hip strength to improve stability about the chain with prolonged walking activity.  Baseline: see above Goal status: INITIAL  4.  Patient will be independent with advanced home program, including appropriate gym exercises in order to assist in management of her chronic conditions.  Baseline: initial HEP issued  Goal status: INITIAL   PLAN:  PT FREQUENCY: 1-2x/week  PT DURATION: 6 weeks  PLANNED INTERVENTIONS: 97164- PT Re-evaluation, 97110-Therapeutic exercises, 97530- Therapeutic activity, 97112- Neuromuscular re-education, 97535- Self Care, 16109- Manual therapy, [id number]- Aquatic Therapy, 97014- Electrical stimulation (unattended), [id number]- Electrical stimulation (manual), 97016- Vasopneumatic device, [id number]- Ionotophoresis 4mg /ml Dexamethasone, Patient/Family education, Taping, Dry Needling, Cryotherapy, and Moist heat  PLAN FOR NEXT SESSION: update HEP to include further postural strengthening, rotator cuff strengthening, CKC LE strengthening; TPDN as needed   Letitia Libra, PT, DPT, ATC 10/31/23 10:14 AM

## 2023-11-04 ENCOUNTER — Telehealth: Payer: Self-pay | Admitting: *Deleted

## 2023-11-04 NOTE — Telephone Encounter (Signed)
Left patient a message to call and schedule annual that is due after 11/26/2023.

## 2023-11-07 ENCOUNTER — Ambulatory Visit: Payer: 59

## 2023-11-07 DIAGNOSIS — G8929 Other chronic pain: Secondary | ICD-10-CM

## 2023-11-07 DIAGNOSIS — M6281 Muscle weakness (generalized): Secondary | ICD-10-CM

## 2023-11-07 DIAGNOSIS — M25512 Pain in left shoulder: Secondary | ICD-10-CM | POA: Diagnosis not present

## 2023-11-07 DIAGNOSIS — R29898 Other symptoms and signs involving the musculoskeletal system: Secondary | ICD-10-CM

## 2023-11-07 NOTE — Therapy (Signed)
OUTPATIENT PHYSICAL THERAPY TREATMENT   Patient Name: Whitney Turner MRN: [id number] DOB:10-07-1976, 47 y.o., female Today's Date: 11/07/2023  END OF SESSION:  PT End of Session - 11/07/23 0932     Visit Number 6    Number of Visits 13    Date for PT Re-Evaluation 11/26/23    Authorization Type [employer]- 20 visits per year    PT Start Time 0932    PT Stop Time 1016    PT Time Calculation (min) 44 min    Activity Tolerance Patient tolerated treatment well               Past Medical History:  Diagnosis Date   Allergy    Heart murmur    Herpes    Past Surgical History:  Procedure Laterality Date   HERNIA REPAIR  1982   KNEE SURGERY  08/2011   TONSILECTOMY/ADENOIDECTOMY WITH MYRINGOTOMY  2000   Patient Active Problem List   Diagnosis Date Noted   Tibialis posterior tendinitis, left 03/10/2023   Mouth ulcer 03/08/2023   Sore throat 03/08/2023   Poor concentration 12/30/2020   Hyperactive behavior 12/30/2020   Chronic left shoulder pain 11/17/2020   Elevated LDL cholesterol level 12/24/2019   Right lower quadrant pain 12/24/2019   Right upper quadrant pain 12/24/2019   Effusion of right knee 06/25/2019   Hemorrhoids 12/04/2018   CIN I (cervical intraepithelial neoplasia I) 04/03/2018   Lateral epicondylitis of left elbow 02/15/2018   Left hand pain 02/23/2016   History of repair of ACL of Left knee 01/26/2016   Plantar fasciitis, bilateral 12/29/2015   Genital herpes 12/01/2012   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/2012   Allergic rhinitis 12/01/2012   Seasonal allergies 12/01/2012    PCP: Jomarie Longs, PA-C  REFERRING PROVIDER: Monica Becton, MD    REFERRING DIAG:  [phone] (ICD-10-CM) - History of repair of ACL  [phone] (ICD-10-CM) - Chronic left shoulder pain    THERAPY DIAG:  Chronic left shoulder pain  Muscle weakness (generalized)  Chronic pain of left knee  Other symptoms and signs involving the musculoskeletal system  Rationale for  Evaluation and Treatment: Rehabilitation  ONSET DATE: knee- acute on chronic October 2024; shoulder- 2021  SUBJECTIVE:   SUBJECTIVE STATEMENT: Patient reports the shoulder feels pretty good. Hasn't had too many issues with it, but always has some tightness. She ran on Saturday with the dogs and had some soreness in the Lt knee that caused her to stop. The knee is fine today. The bridge is hurting her back and thinks it is her SI joint and her Rt knee is being effected.   EVAL: She reports multiple issues noting tightness in her neck/shoulders/Rt hip, Lt shoulder and Lt knee pain. She had had dry needling before and feels that this has been helpful in reducing her pain. She was hiking and thinks she might have hyperextended the left knee on 09/22/23. She thinks that she aggravated her meniscus. She denies any popping/clicking. It feels unstable and occasionally aches. 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 She has not been running or squatting since October, but wants to be able to these things. Patient reports during COVID she worked at [employer] and was lifting heavy bags overhead and thinks that when she injured her shoulder. Sometimes the shoulder just hurts, primarily when she is sleeping (she sleeps on her stomach). She had injections in both the shoulder and knee at recent MD visit. The  injections have helped to reduce her pain.  PERTINENT HISTORY: ACLR Lt 2011 per patient hamstring autograft  PAIN:  Are you having pain? Yes: NPRS scale: 6/10 Pain location: Lt knee Pain description: sore Aggravating factors: pressure,palpation, heavy lifting  Relieving factors: rest, injection  PRECAUTIONS: None   WEIGHT BEARING RESTRICTIONS: No  FALLS:  Has patient fallen in last 6 months? No  LIVING ENVIRONMENT: Lives with: lives with their family Lives in: House/apartment Stairs: Yes: Internal: multiple flights steps; on right going up Has following  equipment at home: None  OCCUPATION: self-employed; sports official (softball, volleyball, and swimming); Printmaker   PATIENT GOALS: "I want to get stronger and get rid of tightness."   NEXT MD VISIT: 11/17/23  OBJECTIVE:  Note: Objective measures were completed at Evaluation unless otherwise noted.  DIAGNOSTIC FINDINGS: Lt shoulder MRI:   IMPRESSION: 1. Mild tendinosis of the supraspinatus and infraspinatus tendon without a tear. 2. Mild arthropathy of the acromioclavicular joint with marrow edema on either side of the joint.  PATIENT SURVEYS:  FOTO shoulder: 70% function to 72% predicted; knee: 67% function to 75% predicted   SENSATION: Not tested  EDEMA:  No obvious swelling about the Lt knee or shoulder   MUSCLE LENGTH: Hamstrings: WNL  POSTURE: rounded shoulders and forward head  PALPATION: TTP Lt patellar tendon  Shoulder not assessed   10/31/23: Lt anterior rotated innominate   SPECIAL TESTS:  (+) trendelenberg   (-) Valgus, Varus, Anterior Drawer No time to complete shoulder special tests   FUNCTIONAL TESTS:  Squat: WNL SL squat: valgus, LOB, trunk flexion   GAIT: Distance walked: 10 ft  Assistive device utilized: None Level of assistance: Complete Independence Comments: WNL  LOWER EXTREMITY ROM:  Active ROM Right eval Left eval  Hip flexion    Hip extension    Hip abduction    Hip adduction    Hip internal rotation    Hip external rotation    Knee flexion Full  Full pain   Knee extension Full  Full   Ankle dorsiflexion    Ankle plantarflexion    Ankle inversion    Ankle eversion     (Blank rows = not tested)  LOWER EXTREMITY MMT:  MMT Right eval Left eval  Hip flexion 4+ 4  Hip extension 4+ 4  Hip abduction 4 4  Hip adduction    Hip internal rotation    Hip external rotation    Knee flexion 5 4+  Knee extension 5 4+ pain   Ankle dorsiflexion    Ankle plantarflexion    Ankle inversion    Ankle eversion      (Blank rows = not tested) CERVICAL ROM:   Active ROM A/PROM (deg) eval  Flexion Full   Extension Full   Right lateral flexion   Left lateral flexion   Right rotation 70  Left rotation 78   (Blank rows = not tested)  UPPER EXTREMITY ROM:  Active ROM Right eval Left eval  Shoulder flexion Full  Full pulling in lat   Shoulder extension    Shoulder abduction Full  Full   Shoulder adduction    Shoulder extension    Shoulder internal rotation  Full   Shoulder external rotation  Full   Elbow flexion    Elbow extension    Wrist flexion    Wrist extension    Wrist ulnar deviation    Wrist radial deviation    Wrist pronation    Wrist supination     (  Blank rows = not tested) UPPER EXTREMITY MMT:  MMT Right eval Left eval  Shoulder flexion 5 5  Shoulder extension    Shoulder abduction 5 5  Shoulder adduction    Shoulder extension    Shoulder internal rotation 5 5  Shoulder external rotation 5 4  Middle trapezius 4 4  Lower trapezius 3+ 3+  Elbow flexion    Elbow extension    Wrist flexion    Wrist extension    Wrist ulnar deviation    Wrist radial deviation    Wrist pronation    Wrist supination    Grip strength     (Blank rows = not tested) [facility] Adult PT Treatment:                                                DATE: 11/07/23 Therapeutic Exercise: UBE level 4 x 2 min each fwd/bwd  Sidelying ER 2 x 10; 3#  Reviewed HEP discussing sets, reps, hold times.  Hip bridge with posterior pelvic tilt x 5   Prone T, W, Y x 10; x 10 with 2 lbs  Resisted rows 25 lbs 2 x 15  Supine lat pull x 10; 2# Updated HEP    [facility] Adult PT Treatment:                                                DATE: 10/31/23 Therapeutic Exercise: Lat pull 2 x 10 @ 25 lbs Bent over row 2 x 15 @ 10 lbs Super man with arm pull down 2 x 10  Sumo squat x 10 @ 20 lbs, x 10 @ 40 lbs Curtsy lunge x 10 each  Split squat x 10 each  Updated HEP  Manual Therapy: Muscle energy technique to improve  pelvic symmetry    [facility] Adult PT Treatment:                                                DATE: 10/25/23 Therapeutic Exercise: HS curl on physioball 2 x 10  Calf stretch on wedge x 1 minute  Leg press 1 x 10 @ 195, 1 x 10 @ 245  Reverse lunge on slider x 10 each SL wall squat with physioball x 10 each  Fire hydrant blue band x 10 each  Updated HEP  Manual Therapy: Skilled palpation of trigger points Trigger Point Dry Needling Treatment: Pre-treatment instruction: Patient instructed on dry needling rationale, procedures, and possible side effects including pain during treatment (achy,cramping feeling), bruising, drop of blood, lightheadedness, nausea, sweating. Patient Consent Given: Yes Education handout provided: Previously provided Muscles treated: Lt gastroc/soleus and bicep femoris   Treatment response/outcome: Twitch response elicited and Palpable decrease in muscle tension Post-treatment instructions: Patient instructed to expect possible mild to moderate muscle soreness later today and/or tomorrow. Patient instructed in methods to reduce muscle soreness and to continue prescribed HEP. If patient was dry needled over the lung field, patient was instructed on signs and symptoms of pneumothorax and, however unlikely, to see immediate medical attention should they occur. Patient was also educated on signs and  symptoms of infection and to seek medical attention should they occur. Patient verbalized understanding of these instructions and education.      PATIENT EDUCATION:  Education details: HEP update  Person educated: Patient Education method: Explanation, Demonstration, Tactile cues, Verbal cues, and Handouts Education comprehension: verbalized understanding, returned demonstration, verbal cues required, and needs further education  HOME EXERCISE PROGRAM: Access Code: [email or web address] URL: [email or web address] Date: 11/07/2023 Prepared by: Letitia Libra  Exercises -  Side Stepping with Resistance at Ankles  - 1 x daily - 7 x weekly - 2 sets - 10 reps - Shoulder External Rotation and Scapular Retraction with Resistance  - 1 x daily - 7 x weekly - 2 sets - 10 reps - Shoulder W - External Rotation with Resistance  - 2 x daily - 7 x weekly - 1-2 sets - 10 reps - 5 sec  hold - Supine Chest Stretch on Foam Roll  - 2 x daily - 7 x weekly - 1 sets - 1 reps - 2-5 min  sec  hold - Hooklying Hamstring Stretch with Strap  - 2 x daily - 7 x weekly - 1 sets - 3 reps - 30 sec  hold - Straight Leg Raise with External Rotation  - 2 x daily - 7 x weekly - 1 sets - 10 reps - 3-5 sec  hold - Supine Bridge with Mini Swiss Ball Between Knees  - 1 x daily - 7 x weekly - 1-2 sets - 10 reps - 5 sec  hold - Cat Cow  - 2 x daily - 7 x weekly - 1 sets - 5-10 reps - 3-5 sec  hold - Cat Cow to Child's Pose  - 2 x daily - 7 x weekly - 1 sets - 3 reps - 30 sec  hold - Child's Pose with Thread the Needle  - 2 x daily - 7 x weekly - 1 sets - 3 reps - 30 sec  hold - Reverse Lunge on Slider  - 1 x daily - 7 x weekly - 2 sets - 10 reps - Single Leg Quarter Squat with Swiss Ball at [employer]  - 1 x daily - 7 x weekly - 2 sets - 10 reps - Supine Hamstring Curl on Swiss Ball  - 1 x daily - 7 x weekly - 2 sets - 10 reps - Quadruped Hip Abduction with Resistance Loop  - 1 x daily - 7 x weekly - 2 sets - 10 reps - Full Superman on Table  - 1 x daily - 7 x weekly - 2 sets - 10 reps - Sidelying Shoulder ER with Towel and Dumbbell  - 1 x daily - 7 x weekly - 2 sets - 10 reps - Prone Middle Trapezius Strengthening on Swiss Ball  - 1 x daily - 7 x weekly - 2 sets - 10 reps - Prone Shoulder W on [employer]  - 1 x daily - 7 x weekly - 2 sets - 10 reps - Prone Lower Trapezius Strengthening on Swiss Ball with Dumbbells  - 1 x daily - 7 x weekly - 2 sets - 10 reps   ASSESSMENT:  CLINICAL IMPRESSION: Focused on the shoulder today, progressing rotator cuff and periscapular strengthening with good tolerance. We  also reviewed specific exercises on HEP that have been causing her SI joint pain. She was instructed on core engagement and posterior pelvic tilt with bridge series, which seemed to help her SI joint pain. She demonstrates proper form  without onset of pain with step down, having met this STG.    EVAL: Patient is a 47 y.o. female who was seen today for physical therapy evaluation and treatment for chronic Lt knee and Lt shoulder pain. Upon assessment she is noted to have full Lt shoulder AROM reporting pulling/tightness at end range of shoulder flexion. Overall good strength about the Lt shoulder with mild rotator cuff weakness. She does have periscapular weakness and postural abnormalities. Her knee has full ROM with patient reporting pain at end range of flexion. She has mild Lt knee weakness and bilateral hip weakness as well as aberrant SL squat mechanics. Patient will benefit from skilled PT to address the above stated deficits in order to optimize their function and assist in overall pain reduction.     OBJECTIVE IMPAIRMENTS: decreased activity tolerance, decreased knowledge of condition, difficulty walking, decreased strength, impaired UE functional use, improper body mechanics, postural dysfunction, and pain.   GOALS: Goals reviewed with patient? Yes  SHORT TERM GOALS: Target date: 11/02/2023     Patient will be independent and compliant with initial HEP.   Baseline: issued at eval  Goal status: MET  2.  Patient will demonstrate proper form with step down on the LLE without pain to signify improvements in knee stability.  Baseline: poor form with SL squat  11/07/23: proper form without pain  Goal status: MET  3. Patient will report 50% improvement in her pain to reduce current functional limitations  Baseline: 6/10 at eval  Goal status: ongoing    LONG TERM GOALS: Target date: 11/26/23  Patient will demonstrate 5/5 middle trap and 4/5 lower trap strength to improve postural  stability.  Baseline: see above Goal status: INITIAL  2.  Patient will score at least 72% shoulder and 75% knee on FOTO to signify clinically meaningful improvement in functional abilities.   Baseline: see above Goal status: INITIAL  3.  Patient will demonstrate 5/5 bilateral hip strength to improve stability about the chain with prolonged walking activity.  Baseline: see above Goal status: INITIAL  4.  Patient will be independent with advanced home program, including appropriate gym exercises in order to assist in management of her chronic conditions.  Baseline: initial HEP issued  Goal status: INITIAL   PLAN:  PT FREQUENCY: 1-2x/week  PT DURATION: 6 weeks  PLANNED INTERVENTIONS: 97164- PT Re-evaluation, 97110-Therapeutic exercises, 97530- Therapeutic activity, 97112- Neuromuscular re-education, 97535- Self Care, 84132- Manual therapy, [id number]- Aquatic Therapy, 97014- Electrical stimulation (unattended), [id number]- Electrical stimulation (manual), 97016- Vasopneumatic device, [id number]- Ionotophoresis 4mg /ml Dexamethasone, Patient/Family education, Taping, Dry Needling, Cryotherapy, and Moist heat  PLAN FOR NEXT SESSION: postural strengthening, rotator cuff strengthening, CKC LE strengthening; TPDN as needed   Letitia Libra, PT, DPT, ATC 11/07/23 10:17 AM

## 2023-11-10 ENCOUNTER — Ambulatory Visit: Payer: 59

## 2023-11-10 DIAGNOSIS — M6281 Muscle weakness (generalized): Secondary | ICD-10-CM

## 2023-11-10 DIAGNOSIS — G8929 Other chronic pain: Secondary | ICD-10-CM

## 2023-11-10 DIAGNOSIS — M25512 Pain in left shoulder: Secondary | ICD-10-CM | POA: Diagnosis not present

## 2023-11-10 DIAGNOSIS — R29898 Other symptoms and signs involving the musculoskeletal system: Secondary | ICD-10-CM

## 2023-11-10 NOTE — Therapy (Signed)
OUTPATIENT PHYSICAL THERAPY TREATMENT   Patient Name: Carlena Brommer MRN: [id number] DOB:12-16-1975, 47 y.o., female Today's Date: 11/10/2023  END OF SESSION:  PT End of Session - 11/10/23 0931     Visit Number 7    Number of Visits 13    Date for PT Re-Evaluation 11/26/23    Authorization Type [employer]- 20 visits per year    PT Start Time 0931    PT Stop Time 1016    PT Time Calculation (min) 45 min    Activity Tolerance Patient tolerated treatment well    Behavior During Therapy [facility] for tasks assessed/performed                Past Medical History:  Diagnosis Date   Allergy    Heart murmur    Herpes    Past Surgical History:  Procedure Laterality Date   HERNIA REPAIR  1982   KNEE SURGERY  08/2011   TONSILECTOMY/ADENOIDECTOMY WITH MYRINGOTOMY  2000   Patient Active Problem List   Diagnosis Date Noted   Tibialis posterior tendinitis, left 03/10/2023   Mouth ulcer 03/08/2023   Sore throat 03/08/2023   Poor concentration 12/30/2020   Hyperactive behavior 12/30/2020   Chronic left shoulder pain 11/17/2020   Elevated LDL cholesterol level 12/24/2019   Right lower quadrant pain 12/24/2019   Right upper quadrant pain 12/24/2019   Effusion of right knee 06/25/2019   Hemorrhoids 12/04/2018   CIN I (cervical intraepithelial neoplasia I) 04/03/2018   Lateral epicondylitis of left elbow 02/15/2018   Left hand pain 02/23/2016   History of repair of ACL of Left knee 01/26/2016   Plantar fasciitis, bilateral 12/29/2015   Genital herpes 12/01/2012   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/2012   Allergic rhinitis 12/01/2012   Seasonal allergies 12/01/2012    PCP: Jomarie Longs, PA-C  REFERRING PROVIDER: Monica Becton, MD    REFERRING DIAG:  [phone] (ICD-10-CM) - History of repair of ACL  [phone] (ICD-10-CM) - Chronic left shoulder pain    THERAPY DIAG:  Chronic left shoulder pain  Muscle weakness (generalized)  Chronic pain of left knee  Other symptoms and  signs involving the musculoskeletal system  Rationale for Evaluation and Treatment: Rehabilitation  ONSET DATE: knee- acute on chronic October 2024; shoulder- 2021  SUBJECTIVE:   SUBJECTIVE STATEMENT: Continues to have difficulty with closed chain SL activity. Her biggest complaint right now is Rt SIJ pain.   EVAL: She reports multiple issues noting tightness in her neck/shoulders/Rt hip, Lt shoulder and Lt knee pain. She had had dry needling before and feels that this has been helpful in reducing her pain. She was hiking and thinks she might have hyperextended the left knee on 09/22/23. She thinks that she aggravated her meniscus. She denies any popping/clicking. It feels unstable and occasionally aches. 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 She has not been running or squatting since October, but wants to be able to these things. Patient reports during COVID she worked at [employer] and was lifting heavy bags overhead and thinks that when she injured her shoulder. Sometimes the shoulder just hurts, primarily when she is sleeping (she sleeps on her stomach). She had injections in both the shoulder and knee at recent MD visit. The injections have helped to reduce her pain.  PERTINENT HISTORY: ACLR Lt 2011 per patient hamstring autograft  PAIN:  Are you having pain? Yes: NPRS scale: 8/10 Pain location: Rt SIJ/posterior hip Pain description:  sore, inflammed  Aggravating factors: lower back strengthening  Relieving factors: rest  PRECAUTIONS: None   WEIGHT BEARING RESTRICTIONS: No  FALLS:  Has patient fallen in last 6 months? No  LIVING ENVIRONMENT: Lives with: lives with their family Lives in: House/apartment Stairs: Yes: Internal: multiple flights steps; on right going up Has following equipment at home: None  OCCUPATION: self-employed; sports official (softball, volleyball, and swimming); Printmaker   PATIENT GOALS: "I want to get  stronger and get rid of tightness."   NEXT MD VISIT: 11/17/23  OBJECTIVE:  Note: Objective measures were completed at Evaluation unless otherwise noted.  DIAGNOSTIC FINDINGS: Lt shoulder MRI:   IMPRESSION: 1. Mild tendinosis of the supraspinatus and infraspinatus tendon without a tear. 2. Mild arthropathy of the acromioclavicular joint with marrow edema on either side of the joint.  PATIENT SURVEYS:  FOTO shoulder: 70% function to 72% predicted; knee: 67% function to 75% predicted 11/10/23: Shoulder: 78% function; knee 75% function   SENSATION: Not tested  EDEMA:  No obvious swelling about the Lt knee or shoulder   MUSCLE LENGTH: Hamstrings: WNL  POSTURE: rounded shoulders and forward head  PALPATION: TTP Lt patellar tendon  Shoulder not assessed   10/31/23: Lt anterior rotated innominate   SPECIAL TESTS:  (+) trendelenberg   (-) Valgus, Varus, Anterior Drawer No time to complete shoulder special tests   FUNCTIONAL TESTS:  Squat: WNL SL squat: valgus, LOB, trunk flexion   GAIT: Distance walked: 10 ft  Assistive device utilized: None Level of assistance: Complete Independence Comments: WNL  LOWER EXTREMITY ROM:  Active ROM Right eval Left eval  Hip flexion    Hip extension    Hip abduction    Hip adduction    Hip internal rotation    Hip external rotation    Knee flexion Full  Full pain   Knee extension Full  Full   Ankle dorsiflexion    Ankle plantarflexion    Ankle inversion    Ankle eversion     (Blank rows = not tested)  LOWER EXTREMITY MMT:  MMT Right eval Left eval  Hip flexion 4+ 4  Hip extension 4+ 4  Hip abduction 4 4  Hip adduction    Hip internal rotation    Hip external rotation    Knee flexion 5 4+  Knee extension 5 4+ pain   Ankle dorsiflexion    Ankle plantarflexion    Ankle inversion    Ankle eversion     (Blank rows = not tested) CERVICAL ROM:   Active ROM A/PROM (deg) eval  Flexion Full   Extension Full    Right lateral flexion   Left lateral flexion   Right rotation 70  Left rotation 78   (Blank rows = not tested)  UPPER EXTREMITY ROM:  Active ROM Right eval Left eval  Shoulder flexion Full  Full pulling in lat   Shoulder extension    Shoulder abduction Full  Full   Shoulder adduction    Shoulder extension    Shoulder internal rotation  Full   Shoulder external rotation  Full   Elbow flexion    Elbow extension    Wrist flexion    Wrist extension    Wrist ulnar deviation    Wrist radial deviation    Wrist pronation    Wrist supination     (Blank rows = not tested) UPPER EXTREMITY MMT:  MMT Right eval Left eval  Shoulder flexion 5 5  Shoulder extension  Shoulder abduction 5 5  Shoulder adduction    Shoulder extension    Shoulder internal rotation 5 5  Shoulder external rotation 5 4  Middle trapezius 4 4  Lower trapezius 3+ 3+  Elbow flexion    Elbow extension    Wrist flexion    Wrist extension    Wrist ulnar deviation    Wrist radial deviation    Wrist pronation    Wrist supination    Grip strength     (Blank rows = not tested) [facility] Adult PT Treatment:                                                DATE: 11/10/23 Therapeutic Exercise: Fire hydrant x 10 each blue band  Donkey kicks x 10 each 5 lbs  SL squat to table x 10 bilateral Split squat x 10 each with blue band pulling laterally x 10 each  Lateral lunge on slider 2 x 10 each  Dead lift attempted d/c due to poor form  Hip hinge at wall with foam roller x 10  Hip hinge in standing with dowel x 10  Updated HEP     [facility] Adult PT Treatment:                                                DATE: 11/07/23 Therapeutic Exercise: UBE level 4 x 2 min each fwd/bwd  Sidelying ER 2 x 10; 3#  Reviewed HEP discussing sets, reps, hold times.  Hip bridge with posterior pelvic tilt x 5   Prone T, W, Y x 10; x 10 with 2 lbs  Resisted rows 25 lbs 2 x 15  Supine lat pull x 10; 2# Updated HEP    [facility] Adult  PT Treatment:                                                DATE: 10/31/23 Therapeutic Exercise: Lat pull 2 x 10 @ 25 lbs Bent over row 2 x 15 @ 10 lbs Super man with arm pull down 2 x 10  Sumo squat x 10 @ 20 lbs, x 10 @ 40 lbs Curtsy lunge x 10 each  Split squat x 10 each  Updated HEP  Manual Therapy: Muscle energy technique to improve pelvic symmetry    [facility] Adult PT Treatment:                                                DATE: 10/25/23 Therapeutic Exercise: HS curl on physioball 2 x 10  Calf stretch on wedge x 1 minute  Leg press 1 x 10 @ 195, 1 x 10 @ 245  Reverse lunge on slider x 10 each SL wall squat with physioball x 10 each  Fire hydrant blue band x 10 each  Updated HEP  Manual Therapy: Skilled palpation of trigger points Trigger Point Dry Needling Treatment: Pre-treatment instruction: Patient instructed on dry needling rationale, procedures, and  possible side effects including pain during treatment (achy,cramping feeling), bruising, drop of blood, lightheadedness, nausea, sweating. Patient Consent Given: Yes Education handout provided: Previously provided Muscles treated: Lt gastroc/soleus and bicep femoris   Treatment response/outcome: Twitch response elicited and Palpable decrease in muscle tension Post-treatment instructions: Patient instructed to expect possible mild to moderate muscle soreness later today and/or tomorrow. Patient instructed in methods to reduce muscle soreness and to continue prescribed HEP. If patient was dry needled over the lung field, patient was instructed on signs and symptoms of pneumothorax and, however unlikely, to see immediate medical attention should they occur. Patient was also educated on signs and symptoms of infection and to seek medical attention should they occur. Patient verbalized understanding of these instructions and education.      PATIENT EDUCATION:  Education details: HEP update  Person educated: Patient Education  method: Explanation, Demonstration, Tactile cues, Verbal cues, and Handouts Education comprehension: verbalized understanding, returned demonstration, verbal cues required, and needs further education  HOME EXERCISE PROGRAM: Access Code: [email or web address] URL: [email or web address] Date: 11/10/2023 Prepared by: Letitia Libra  Exercises - Side Stepping with Resistance at Ankles  - 1 x daily - 7 x weekly - 2 sets - 10 reps - Shoulder External Rotation and Scapular Retraction with Resistance  - 1 x daily - 7 x weekly - 2 sets - 10 reps - Shoulder W - External Rotation with Resistance  - 2 x daily - 7 x weekly - 1-2 sets - 10 reps - 5 sec  hold - Supine Chest Stretch on Foam Roll  - 2 x daily - 7 x weekly - 1 sets - 1 reps - 2-5 min  sec  hold - Hooklying Hamstring Stretch with Strap  - 2 x daily - 7 x weekly - 1 sets - 3 reps - 30 sec  hold - Straight Leg Raise with External Rotation  - 2 x daily - 7 x weekly - 1 sets - 10 reps - 3-5 sec  hold - Supine Bridge with Mini Swiss Ball Between Knees  - 1 x daily - 7 x weekly - 1-2 sets - 10 reps - 5 sec  hold - Cat Cow  - 2 x daily - 7 x weekly - 1 sets - 5-10 reps - 3-5 sec  hold - Cat Cow to Child's Pose  - 2 x daily - 7 x weekly - 1 sets - 3 reps - 30 sec  hold - Child's Pose with Thread the Needle  - 2 x daily - 7 x weekly - 1 sets - 3 reps - 30 sec  hold - Reverse Lunge on Slider  - 1 x daily - 7 x weekly - 2 sets - 10 reps - Single Leg Quarter Squat with Swiss Ball at [employer]  - 1 x daily - 7 x weekly - 2 sets - 10 reps - Supine Hamstring Curl on Swiss Ball  - 1 x daily - 7 x weekly - 2 sets - 10 reps - Quadruped Hip Abduction with Resistance Loop  - 1 x daily - 7 x weekly - 2 sets - 10 reps - Sidelying Shoulder ER with Towel and Dumbbell  - 1 x daily - 7 x weekly - 2 sets - 10 reps - Prone Middle Trapezius Strengthening on Swiss Ball  - 1 x daily - 7 x weekly - 2 sets - 10 reps - Prone Shoulder W on [employer]  - 1 x daily - 7 x weekly - 2  sets - 10 reps - Prone Lower Trapezius Strengthening on Swiss Ball with Dumbbells  - 1 x daily - 7 x weekly - 2 sets - 10 reps - Quadruped Hip Extension with Mini Swiss Ball  - 1 x daily - 7 x weekly - 2 sets - 10 reps - Single Leg Squat with Chair Touch  - 1 x daily - 7 x weekly - 2 sets - 10 reps - Lateral Lunge with Slider  - 1 x daily - 7 x weekly - 2 sets - 10 reps - Standing Hip Hinge with Dowel  - 1 x daily - 7 x weekly - 2 sets - 10 reps   ASSESSMENT:  CLINICAL IMPRESSION: Patient tolerated session well today focusing on LE closed chain strength progression. She has significant difficulty allowing for hip hinge when attempting deadlift maneuver, so worked on Naval architect foam roller and dowel for tactile feedback. She is unable to control for excessive trunk flexion with foam roller at wall, but is able to demonstrate proper hip hinge with dowel in standing. With SL and lunge activity valgus collapse is occasionally present on the LLE. Continues to exhibit gluteal weakness with closed chain strengthening.    EVAL: Patient is a 47 y.o. female who was seen today for physical therapy evaluation and treatment for chronic Lt knee and Lt shoulder pain. Upon assessment she is noted to have full Lt shoulder AROM reporting pulling/tightness at end range of shoulder flexion. Overall good strength about the Lt shoulder with mild rotator cuff weakness. She does have periscapular weakness and postural abnormalities. Her knee has full ROM with patient reporting pain at end range of flexion. She has mild Lt knee weakness and bilateral hip weakness as well as aberrant SL squat mechanics. Patient will benefit from skilled PT to address the above stated deficits in order to optimize their function and assist in overall pain reduction.     OBJECTIVE IMPAIRMENTS: decreased activity tolerance, decreased knowledge of condition, difficulty walking, decreased strength, impaired UE functional use,  improper body mechanics, postural dysfunction, and pain.   GOALS: Goals reviewed with patient? Yes  SHORT TERM GOALS: Target date: 11/02/2023     Patient will be independent and compliant with initial HEP.   Baseline: issued at eval  Goal status: MET  2.  Patient will demonstrate proper form with step down on the LLE without pain to signify improvements in knee stability.  Baseline: poor form with SL squat  11/07/23: proper form without pain  Goal status: MET  3. Patient will report 50% improvement in her pain to reduce current functional limitations  Baseline: 6/10 at eval  Goal status: ongoing    LONG TERM GOALS: Target date: 11/26/23  Patient will demonstrate 5/5 middle trap and 4/5 lower trap strength to improve postural stability.  Baseline: see above Goal status: INITIAL  2.  Patient will score at least 72% shoulder and 75% knee on FOTO to signify clinically meaningful improvement in functional abilities.   Baseline: see above Goal status: INITIAL  3.  Patient will demonstrate 5/5 bilateral hip strength to improve stability about the chain with prolonged walking activity.  Baseline: see above Goal status: INITIAL  4.  Patient will be independent with advanced home program, including appropriate gym exercises in order to assist in management of her chronic conditions.  Baseline: initial HEP issued  Goal status: INITIAL   PLAN:  PT FREQUENCY: 1-2x/week  PT DURATION: 6 weeks  PLANNED INTERVENTIONS: 13244- PT Re-evaluation,  97110-Therapeutic exercises, 97530- Therapeutic activity, [id number]- Neuromuscular re-education, 97535- Self Care, [id number]- Manual therapy, [id number]- Aquatic Therapy, 97014- Electrical stimulation (unattended), [id number]- Electrical stimulation (manual), [id number]- Vasopneumatic device, [id number]- Ionotophoresis 4mg /ml Dexamethasone, Patient/Family education, Taping, Dry Needling, Cryotherapy, and Moist heat  PLAN FOR NEXT SESSION: postural strengthening, rotator  cuff strengthening, CKC LE strengthening; TPDN as needed   Letitia Libra, PT, DPT, ATC 11/10/23 10:25 AM

## 2023-11-14 ENCOUNTER — Encounter: Payer: 59 | Admitting: Rehabilitative and Restorative Service Providers"

## 2023-11-17 ENCOUNTER — Ambulatory Visit: Payer: 59 | Admitting: Sports Medicine

## 2023-11-21 ENCOUNTER — Ambulatory Visit: Payer: 59

## 2023-11-21 DIAGNOSIS — R29898 Other symptoms and signs involving the musculoskeletal system: Secondary | ICD-10-CM

## 2023-11-21 DIAGNOSIS — M25512 Pain in left shoulder: Secondary | ICD-10-CM | POA: Diagnosis not present

## 2023-11-21 DIAGNOSIS — M6281 Muscle weakness (generalized): Secondary | ICD-10-CM

## 2023-11-21 DIAGNOSIS — G8929 Other chronic pain: Secondary | ICD-10-CM

## 2023-11-21 NOTE — Therapy (Signed)
OUTPATIENT PHYSICAL THERAPY TREATMENT AND RE-CERTIFICATION  Patient Name: Whitney Turner MRN: [id number] DOB:12/07/1975, 47 y.o., female Today's Date: 11/21/2023  END OF SESSION:  PT End of Session - 11/21/23 0933     Visit Number 8    Number of Visits 16    Date for PT Re-Evaluation 01/07/24    Authorization Type [employer]- 20 visits per year    PT Start Time 0933    PT Stop Time 1015    PT Time Calculation (min) 42 min    Activity Tolerance Patient tolerated treatment well    Behavior During Therapy [facility] for tasks assessed/performed                 Past Medical History:  Diagnosis Date   Allergy    Heart murmur    Herpes    Past Surgical History:  Procedure Laterality Date   HERNIA REPAIR  1982   KNEE SURGERY  08/2011   TONSILECTOMY/ADENOIDECTOMY WITH MYRINGOTOMY  2000   Patient Active Problem List   Diagnosis Date Noted   Tibialis posterior tendinitis, left 03/10/2023   Mouth ulcer 03/08/2023   Sore throat 03/08/2023   Poor concentration 12/30/2020   Hyperactive behavior 12/30/2020   Chronic left shoulder pain 11/17/2020   Elevated LDL cholesterol level 12/24/2019   Right lower quadrant pain 12/24/2019   Right upper quadrant pain 12/24/2019   Effusion of right knee 06/25/2019   Hemorrhoids 12/04/2018   CIN I (cervical intraepithelial neoplasia I) 04/03/2018   Lateral epicondylitis of left elbow 02/15/2018   Left hand pain 02/23/2016   History of repair of ACL of Left knee 01/26/2016   Plantar fasciitis, bilateral 12/29/2015   Genital herpes 12/01/2012   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/2012   Allergic rhinitis 12/01/2012   Seasonal allergies 12/01/2012    PCP: Jomarie Longs, PA-C  REFERRING PROVIDER: Monica Becton, MD    REFERRING DIAG:  [phone] (ICD-10-CM) - History of repair of ACL  [phone] (ICD-10-CM) - Chronic left shoulder pain    THERAPY DIAG:  Chronic left shoulder pain  Muscle weakness (generalized)  Chronic pain of left  knee  Other symptoms and signs involving the musculoskeletal system  Rationale for Evaluation and Treatment: Rehabilitation  ONSET DATE: knee- acute on chronic October 2024; shoulder- 2021  SUBJECTIVE:   SUBJECTIVE STATEMENT: Patient reports her back is sore probably from working out and lifting Christmas boxes. Y on the ball seems to irritate the Lt shoulder. Overall she feels that the shoulder is better and feels like she is getting stronger. Still feels that SL strengthening on the Lt side is hard to do due to weakness. She tried running and had to stop because the knee gets irritated after about a mile. Reports about 80% improvement in her pain since the start of care.   EVAL: She reports multiple issues noting tightness in her neck/shoulders/Rt hip, Lt shoulder and Lt knee pain. She had had dry needling before and feels that this has been helpful in reducing her pain. She was hiking and thinks she might have hyperextended the left knee on 09/22/23. She thinks that she aggravated her meniscus. She denies any popping/clicking. It feels unstable and occasionally aches. 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 She has not been running or squatting since October, but wants to be able to these things. Patient reports during COVID she worked at [employer] and was lifting heavy bags overhead and thinks that when she  injured her shoulder. Sometimes the shoulder just hurts, primarily when she is sleeping (she sleeps on her stomach). She had injections in both the shoulder and knee at recent MD visit. The injections have helped to reduce her pain.  PERTINENT HISTORY: ACLR Lt 2011 per patient hamstring autograft  PAIN:  Are you having pain? Yes: NPRS scale: 4/10 Pain location: shoulders, back  Pain description: muscle sore Aggravating factors: from working out  Relieving factors: rest  PRECAUTIONS: None   WEIGHT BEARING RESTRICTIONS: No  FALLS:  Has patient  fallen in last 6 months? No  LIVING ENVIRONMENT: Lives with: lives with their family Lives in: House/apartment Stairs: Yes: Internal: multiple flights steps; on right going up Has following equipment at home: None  OCCUPATION: self-employed; sports official (softball, volleyball, and swimming); Printmaker   PATIENT GOALS: "I want to get stronger and get rid of tightness."   NEXT MD VISIT: 11/17/23  OBJECTIVE:  Note: Objective measures were completed at Evaluation unless otherwise noted.  DIAGNOSTIC FINDINGS: Lt shoulder MRI:   IMPRESSION: 1. Mild tendinosis of the supraspinatus and infraspinatus tendon without a tear. 2. Mild arthropathy of the acromioclavicular joint with marrow edema on either side of the joint.  PATIENT SURVEYS:  FOTO shoulder: 70% function to 72% predicted; knee: 67% function to 75% predicted 11/10/23: Shoulder: 78% function; knee 75% function   SENSATION: Not tested  EDEMA:  No obvious swelling about the Lt knee or shoulder   MUSCLE LENGTH: Hamstrings: WNL  POSTURE: rounded shoulders and forward head  PALPATION: TTP Lt patellar tendon  Shoulder not assessed   10/31/23: Lt anterior rotated innominate   SPECIAL TESTS:  (+) trendelenberg   (-) Valgus, Varus, Anterior Drawer No time to complete shoulder special tests   FUNCTIONAL TESTS:  Squat: WNL SL squat: valgus, LOB, trunk flexion   GAIT: Distance walked: 10 ft  Assistive device utilized: None Level of assistance: Complete Independence Comments: WNL  LOWER EXTREMITY ROM:  Active ROM Right eval Left eval  Hip flexion    Hip extension    Hip abduction    Hip adduction    Hip internal rotation    Hip external rotation    Knee flexion Full  Full pain   Knee extension Full  Full   Ankle dorsiflexion    Ankle plantarflexion    Ankle inversion    Ankle eversion     (Blank rows = not tested)  LOWER EXTREMITY MMT:  MMT Right eval Left eval 11/21/23  Hip flexion  4+ 4 Lt: 4+; Rt: 5   Hip extension 4+ 4 Rt:5; Lt: 4   Hip abduction 4 4 Lt:4; Rt: 5   Hip adduction     Hip internal rotation     Hip external rotation     Knee flexion 5 4+ Lt: 5  Knee extension 5 4+ pain  Lt: 5   Ankle dorsiflexion     Ankle plantarflexion     Ankle inversion     Ankle eversion      (Blank rows = not tested) CERVICAL ROM:   Active ROM A/PROM (deg) eval  Flexion Full   Extension Full   Right lateral flexion   Left lateral flexion   Right rotation 70  Left rotation 78   (Blank rows = not tested)  UPPER EXTREMITY ROM:  Active ROM Right eval Left eval  Shoulder flexion Full  Full pulling in lat   Shoulder extension    Shoulder abduction Full  Full  Shoulder adduction    Shoulder extension    Shoulder internal rotation  Full   Shoulder external rotation  Full   Elbow flexion    Elbow extension    Wrist flexion    Wrist extension    Wrist ulnar deviation    Wrist radial deviation    Wrist pronation    Wrist supination     (Blank rows = not tested) UPPER EXTREMITY MMT:  MMT Right eval Left eval 11/21/23  Shoulder flexion 5 5   Shoulder extension     Shoulder abduction 5 5   Shoulder adduction     Shoulder extension     Shoulder internal rotation 5 5   Shoulder external rotation 5 4   Middle trapezius 4 4 Rt: 5; Lt: 4+  Lower trapezius 3+ 3+ Bilateral 4-   Elbow flexion     Elbow extension     Wrist flexion     Wrist extension     Wrist ulnar deviation     Wrist radial deviation     Wrist pronation     Wrist supination     Grip strength      (Blank rows = not tested) [facility] Adult PT Treatment:                                                DATE: 11/21/23 Therapeutic Exercise: Lateral step down 6 inch step 2 x 10  Forward step down 4 inch 2 x 10  Opposite touchdown to kettlebell 2 x 10  BOSU squats 2 x 10  Lunge on BOSU to SLS x 10 each   Therapeutic Activity: Re-assessment to determine overall progress, educating patient on  progress towards goals.   Self Care: Recommended to begin cardio workout at the gym on the bike.    [facility] Adult PT Treatment:                                                DATE: 11/10/23 Therapeutic Exercise: Fire hydrant x 10 each blue band  Donkey kicks x 10 each 5 lbs  SL squat to table x 10 bilateral Split squat x 10 each with blue band pulling laterally x 10 each  Lateral lunge on slider 2 x 10 each  Dead lift attempted d/c due to poor form  Hip hinge at wall with foam roller x 10  Hip hinge in standing with dowel x 10  Updated HEP     [facility] Adult PT Treatment:                                                DATE: 11/07/23 Therapeutic Exercise: UBE level 4 x 2 min each fwd/bwd  Sidelying ER 2 x 10; 3#  Reviewed HEP discussing sets, reps, hold times.  Hip bridge with posterior pelvic tilt x 5   Prone T, W, Y x 10; x 10 with 2 lbs  Resisted rows 25 lbs 2 x 15  Supine lat pull x 10; 2# Updated HEP    [facility] Adult PT Treatment:  DATE: 10/31/23 Therapeutic Exercise: Lat pull 2 x 10 @ 25 lbs Bent over row 2 x 15 @ 10 lbs Super man with arm pull down 2 x 10  Sumo squat x 10 @ 20 lbs, x 10 @ 40 lbs Curtsy lunge x 10 each  Split squat x 10 each  Updated HEP  Manual Therapy: Muscle energy technique to improve pelvic symmetry    [facility] Adult PT Treatment:                                                DATE: 10/25/23 Therapeutic Exercise: HS curl on physioball 2 x 10  Calf stretch on wedge x 1 minute  Leg press 1 x 10 @ 195, 1 x 10 @ 245  Reverse lunge on slider x 10 each SL wall squat with physioball x 10 each  Fire hydrant blue band x 10 each  Updated HEP  Manual Therapy: Skilled palpation of trigger points Trigger Point Dry Needling Treatment: Pre-treatment instruction: Patient instructed on dry needling rationale, procedures, and possible side effects including pain during treatment (achy,cramping feeling), bruising, drop of  blood, lightheadedness, nausea, sweating. Patient Consent Given: Yes Education handout provided: Previously provided Muscles treated: Lt gastroc/soleus and bicep femoris   Treatment response/outcome: Twitch response elicited and Palpable decrease in muscle tension Post-treatment instructions: Patient instructed to expect possible mild to moderate muscle soreness later today and/or tomorrow. Patient instructed in methods to reduce muscle soreness and to continue prescribed HEP. If patient was dry needled over the lung field, patient was instructed on signs and symptoms of pneumothorax and, however unlikely, to see immediate medical attention should they occur. Patient was also educated on signs and symptoms of infection and to seek medical attention should they occur. Patient verbalized understanding of these instructions and education.      PATIENT EDUCATION:  Education details: HEP verbal update to include BOSU squats; POC   Person educated: Patient Education method: Explanation Education comprehension: verbalized understanding  HOME EXERCISE PROGRAM: Access Code: [email or web address] URL: [email or web address] Date: 11/10/2023 Prepared by: Letitia Libra  Exercises - Side Stepping with Resistance at Ankles  - 1 x daily - 7 x weekly - 2 sets - 10 reps - Shoulder External Rotation and Scapular Retraction with Resistance  - 1 x daily - 7 x weekly - 2 sets - 10 reps - Shoulder W - External Rotation with Resistance  - 2 x daily - 7 x weekly - 1-2 sets - 10 reps - 5 sec  hold - Supine Chest Stretch on Foam Roll  - 2 x daily - 7 x weekly - 1 sets - 1 reps - 2-5 min  sec  hold - Hooklying Hamstring Stretch with Strap  - 2 x daily - 7 x weekly - 1 sets - 3 reps - 30 sec  hold - Straight Leg Raise with External Rotation  - 2 x daily - 7 x weekly - 1 sets - 10 reps - 3-5 sec  hold - Supine Bridge with Mini Swiss Ball Between Knees  - 1 x daily - 7 x weekly - 1-2 sets - 10 reps - 5 sec  hold -  Cat Cow  - 2 x daily - 7 x weekly - 1 sets - 5-10 reps - 3-5 sec  hold - Cat Cow to Child's Pose  - 2 x daily -  7 x weekly - 1 sets - 3 reps - 30 sec  hold - Child's Pose with Thread the Needle  - 2 x daily - 7 x weekly - 1 sets - 3 reps - 30 sec  hold - Reverse Lunge on Slider  - 1 x daily - 7 x weekly - 2 sets - 10 reps - Single Leg Quarter Squat with Swiss Ball at [employer]  - 1 x daily - 7 x weekly - 2 sets - 10 reps - Supine Hamstring Curl on Swiss Ball  - 1 x daily - 7 x weekly - 2 sets - 10 reps - Quadruped Hip Abduction with Resistance Loop  - 1 x daily - 7 x weekly - 2 sets - 10 reps - Sidelying Shoulder ER with Towel and Dumbbell  - 1 x daily - 7 x weekly - 2 sets - 10 reps - Prone Middle Trapezius Strengthening on Swiss Ball  - 1 x daily - 7 x weekly - 2 sets - 10 reps - Prone Shoulder W on [employer]  - 1 x daily - 7 x weekly - 2 sets - 10 reps - Prone Lower Trapezius Strengthening on Swiss Ball with Dumbbells  - 1 x daily - 7 x weekly - 2 sets - 10 reps - Quadruped Hip Extension with Mini Swiss Ball  - 1 x daily - 7 x weekly - 2 sets - 10 reps - Single Leg Squat with Chair Touch  - 1 x daily - 7 x weekly - 2 sets - 10 reps - Lateral Lunge with Slider  - 1 x daily - 7 x weekly - 2 sets - 10 reps - Standing Hip Hinge with Dowel  - 1 x daily - 7 x weekly - 2 sets - 10 reps   ASSESSMENT:  CLINICAL IMPRESSION: Ramiah is making steady progress in PT for her chronic Lt knee and Lt shoulder pain. She has met all short term functional goals and 1/4 long term functional goals. Lt knee pain continues to be exacerbated with running activity and shoulder pain is exacerbated with lifting/reaching activity. She has lingering periscapular and hip weakness bilaterally (Lt>Rt) with aberrant closed chain mechanics present with squatting/lunging activity. She will benefit from continued skilled PT 1 x week for 6 additional weeks to further progress her strength and address body mechanics with functional tasks  in order to optimize her function and assist in overall pain reduction.    EVAL: Patient is a 47 y.o. female who was seen today for physical therapy evaluation and treatment for chronic Lt knee and Lt shoulder pain. Upon assessment she is noted to have full Lt shoulder AROM reporting pulling/tightness at end range of shoulder flexion. Overall good strength about the Lt shoulder with mild rotator cuff weakness. She does have periscapular weakness and postural abnormalities. Her knee has full ROM with patient reporting pain at end range of flexion. She has mild Lt knee weakness and bilateral hip weakness as well as aberrant SL squat mechanics. Patient will benefit from skilled PT to address the above stated deficits in order to optimize their function and assist in overall pain reduction.     OBJECTIVE IMPAIRMENTS: decreased activity tolerance, decreased knowledge of condition, difficulty walking, decreased strength, impaired UE functional use, improper body mechanics, postural dysfunction, and pain.   GOALS: Goals reviewed with patient? Yes  SHORT TERM GOALS: Target date: 11/02/2023     Patient will be independent and compliant with initial HEP.  Baseline: issued at eval  Goal status: MET  2.  Patient will demonstrate proper form with step down on the LLE without pain to signify improvements in knee stability.  Baseline: poor form with SL squat  11/07/23: proper form without pain  Goal status: MET  3. Patient will report 50% improvement in her pain to reduce current functional limitations  Baseline: 6/10 at eval  11/21/23: 80% improvement   Goal status: MET    LONG TERM GOALS: Target date: 01/07/24  Patient will demonstrate 5/5 middle trap and 4/5 lower trap strength to improve postural stability.  Baseline: see above Goal status: partially met   2.  Patient will score at least 72% shoulder and 75% knee on FOTO to signify clinically meaningful improvement in functional abilities.    Baseline: see above Goal status: MET  3.  Patient will demonstrate 5/5 bilateral hip strength to improve stability about the chain with prolonged walking activity.  Baseline: see above Goal status: partially met   4.  Patient will be independent with advanced home program, including appropriate gym exercises in order to assist in management of her chronic conditions.  Baseline: initial HEP issued  Goal status: progressing    PLAN:  PT FREQUENCY: 1x/week  PT DURATION: 6 weeks  PLANNED INTERVENTIONS: 97164- PT Re-evaluation, 97110-Therapeutic exercises, 97530- Therapeutic activity, 97112- Neuromuscular re-education, 97535- Self Care, 40981- Manual therapy, [id number]- Aquatic Therapy, 97014- Electrical stimulation (unattended), [id number]- Electrical stimulation (manual), 97016- Vasopneumatic device, [id number]- Ionotophoresis 4mg /ml Dexamethasone, Patient/Family education, Taping, Dry Needling, Cryotherapy, and Moist heat  PLAN FOR NEXT SESSION: postural strengthening, rotator cuff strengthening, CKC LE strengthening; TPDN as needed   Letitia Libra, PT, DPT, ATC 11/21/23 12:00 PM

## 2023-11-25 ENCOUNTER — Other Ambulatory Visit: Payer: Self-pay | Admitting: Obstetrics and Gynecology

## 2023-11-25 DIAGNOSIS — Z01419 Encounter for gynecological examination (general) (routine) without abnormal findings: Secondary | ICD-10-CM

## 2023-11-29 ENCOUNTER — Encounter: Payer: Self-pay | Admitting: Rehabilitative and Restorative Service Providers"

## 2023-11-30 ENCOUNTER — Other Ambulatory Visit: Payer: Self-pay | Admitting: *Deleted

## 2023-11-30 ENCOUNTER — Ambulatory Visit: Payer: 59

## 2023-11-30 ENCOUNTER — Ambulatory Visit: Payer: 59 | Admitting: Sports Medicine

## 2023-11-30 DIAGNOSIS — Z0189 Encounter for other specified special examinations: Secondary | ICD-10-CM | POA: Diagnosis not present

## 2023-11-30 DIAGNOSIS — M25462 Effusion, left knee: Secondary | ICD-10-CM

## 2023-11-30 DIAGNOSIS — Z9889 Other specified postprocedural states: Secondary | ICD-10-CM | POA: Diagnosis not present

## 2023-11-30 DIAGNOSIS — Z01419 Encounter for gynecological examination (general) (routine) without abnormal findings: Secondary | ICD-10-CM

## 2023-11-30 MED ORDER — ETONOGESTREL-ETHINYL ESTRADIOL 0.12-0.015 MG/24HR VA RING: VAGINAL_RING | VAGINAL | 1 refills | Status: DC

## 2023-11-30 NOTE — Assessment & Plan Note (Signed)
 This is a very pleasant 48 year old female, history of hamstring allograft ACL repair with known post ACL reconstruction osteoarthritis per I last saw her in November, she was having some increasing pain medial joint line, ACL did feel loose but she had a negative pivot shift test. An injection in 2020 had worked well, we repeated the injection with instructions to do PT and advanced imaging if not significantly better. Today she does have a mild effusion, she is a positive Lachman's test and a positive pivot shift sign, no pain with terminal flexion and a negative McMurray's sign. LCL and MCL are stable. 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 Unfortunately she has not improved, proceeding with updated x-rays, left knee MRI, follow-up to go over MRI results.

## 2023-11-30 NOTE — Progress Notes (Signed)
    Procedures performed today:    None.  Independent interpretation of notes and tests performed by another provider:   None.  Brief History, Exam, Impression, and Recommendations:    History of repair of ACL of Left knee This is a very pleasant 48 year old female, history of hamstring allograft ACL repair with known post ACL reconstruction osteoarthritis per I last saw her in November, she was having some increasing pain medial joint line, ACL did feel loose but she had a negative pivot shift test. An injection in 2020 had worked well, we repeated the injection with instructions to do PT and advanced imaging if not significantly better. Today she does have a mild effusion, she is a positive Lachman's test and a positive pivot shift sign, no pain with terminal flexion and a negative McMurray's sign. LCL and MCL are stable. 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 Unfortunately she has not improved, proceeding with updated x-rays, left knee MRI, follow-up to go over MRI results.    ____________________________________________ Debby PARAS. Curtis, M.D., ABFM., CAQSM., AME. Primary Care and Sports Medicine [facility]  Adjunct Professor of [facility] Medicine  [employer]  [employer]  Restaurant Manager, [employer]

## 2023-12-03 ENCOUNTER — Ambulatory Visit (INDEPENDENT_AMBULATORY_CARE_PROVIDER_SITE_OTHER): Payer: 59

## 2023-12-03 DIAGNOSIS — S83207A Unspecified tear of unspecified meniscus, current injury, left knee, initial encounter: Secondary | ICD-10-CM

## 2023-12-03 DIAGNOSIS — Z9889 Other specified postprocedural states: Secondary | ICD-10-CM

## 2023-12-04 NOTE — Progress Notes (Signed)
ANNUAL EXAM Patient name: Whitney Turner MRN [id number]  Date of birth: 1976/05/02 Chief Complaint:   Annual Exam  History of Present Illness:   Whitney Turner is a 48 y.o. [phone] female being seen today for a routine annual exam.   Current complaints: None. Doing well on Nuvaring. Works very well since she travels a lot for work.   Discussed the use of AI scribe software for clinical note transcription with the patient, who gave verbal consent to proceed.  The patient, with a history of pre-menopausal symptoms and herpes simplex virus, presents for a routine check-up. She reports significant improvement in her pre-menopausal symptoms since starting the NuvaRing. Previously, she experienced erratic menstrual cycles and heavy bleeding. Now, her periods are regular and she has not had any issues with bleeding. She prefers the NuvaRing over oral contraceptives due to her irregular schedule and the side effects she experienced with the pill. She expresses a desire to avoid a hysterectomy and plans to continue using the NuvaRing until she reaches menopause.  Regarding her herpes simplex virus, the patient reports that she only has outbreaks when she is in the sun and gets sunburnt or when her immune system is low due to illness. She uses Valtrex as needed, particularly in the summer when she is more likely to be in the sun.       Patient's last menstrual period was 11/29/2023.  Last pap:  Was in 2014 - ASCUS/HPV pos. Had colpo - CIN1.  Pap with HPV normal in 2019 and 2020.  11/2022: normal pap/HPV pos (non 16/18)  Last MXR: 12/23/2022, birad 1   Review of Systems:   Pertinent items are noted in HPI Denies any headaches, blurred vision, fatigue, shortness of breath, chest pain, abdominal pain, abnormal vaginal discharge/itching/odor/irritation, bowel movements, urination, or intercourse unless otherwise stated above.  Pertinent History Reviewed:  Reviewed past medical,surgical, social and  family history.  Reviewed problem list, medications and allergies. Physical Assessment:   Vitals:   12/08/23 0830  BP: 114/69  Pulse: 69  Weight: 138 lb (62.6 kg)  Height: 5\' 3"  (1.6 m)   Body mass index is 24.45 kg/m.   Physical Examination:  General appearance - well appearing, and in no distress Mental status - alert, oriented to person, place, and time Psych:  She has a normal mood and affect Skin - warm and dry, normal color, no suspicious lesions noted Chest - effort normal Heart - normal rate  Breasts - breasts appear normal, no suspicious masses, no skin or nipple changes or axillary nodes Abdomen - soft, nontender, nondistended, no masses or organomegaly Pelvic -  VULVA: normal appearing vulva with no masses, tenderness or lesions  VAGINA: normal appearing vagina with normal color and discharge, no lesions  CERVIX: normal appearing cervix without discharge or lesions, no CMT UTERUS: uterus is felt to be normal size, shape, consistency and nontender  ADNEXA: No adnexal masses or tenderness noted. Extremities:  No swelling or varicosities noted  Chaperone present for exam  No results found for this or any previous visit (from the past 24 hours).  Assessment & Plan:  Whitney was seen today for annual exam.  Diagnoses and all orders for this visit:  Encounter for annual routine gynecological examination - Cervical cancer screening: Discussed guidelines. Pap with HPV done.  - STD Testing: accepts - Birth Control: Discussed options I.e. Nuvaring and patch that will regulate cycle but not rely on her as much. She would like to try Nuvaring. Reviewed  instructions for use.  - Breast Health: Encouraged self breast awareness/SBE. Teaching provided. Discussed limits of clinical breast exam for detecting breast cancer. Rx given for MXR by PCP.  She prefers not annually at this time and prefers every other year. - F/U 12 months and prn  -     Cytology - PAP -      etonogestrel-ethinyl estradiol (NUVARING) 0.12-0.015 MG/24HR vaginal ring; Insert vaginally and leave in place for 3 consecutive weeks, then remove for 1 week.  Genital herpes simplex, unspecified site Prefers larger supply to have on hand for when she travels and dose as needed.  -     valACYclovir (VALTREX) 500 MG tablet; Take 1 tablet (500 mg total) by mouth daily. Can increase to twice a day for 5 days in the event of a recurrence  CIN I (cervical intraepithelial neoplasia I) Cotesting done today   Meds:  Meds ordered this encounter  Medications   etonogestrel-ethinyl estradiol (NUVARING) 0.12-0.015 MG/24HR vaginal ring    Sig: Insert vaginally and leave in place for 3 consecutive weeks, then remove for 1 week.    Dispense:  3 each    Refill:  3   valACYclovir (VALTREX) 500 MG tablet    Sig: Take 1 tablet (500 mg total) by mouth daily. Can increase to twice a day for 5 days in the event of a recurrence    Dispense:  90 tablet    Refill:  2    Follow-up: Return in about 1 year (around 12/07/2024) for annual.  Milas Hock, MD 12/08/2023 8:46 AM

## 2023-12-08 ENCOUNTER — Ambulatory Visit (INDEPENDENT_AMBULATORY_CARE_PROVIDER_SITE_OTHER): Payer: 59 | Admitting: Obstetrics and Gynecology

## 2023-12-08 ENCOUNTER — Other Ambulatory Visit (HOSPITAL_COMMUNITY)
Admission: RE | Admit: 2023-12-08 | Discharge: 2023-12-08 | Disposition: A | Discharge status: Home / Self Care | Payer: 59 | Source: Ambulatory Visit | Attending: Obstetrics and Gynecology | Admitting: Obstetrics and Gynecology

## 2023-12-08 ENCOUNTER — Encounter: Payer: Self-pay | Admitting: Obstetrics and Gynecology

## 2023-12-08 VITALS — BP 114/69 | HR 69 | Ht 63.0 in | Wt 138.0 lb

## 2023-12-08 DIAGNOSIS — N870 Mild cervical dysplasia: Secondary | ICD-10-CM

## 2023-12-08 DIAGNOSIS — Z01419 Encounter for gynecological examination (general) (routine) without abnormal findings: Secondary | ICD-10-CM

## 2023-12-08 DIAGNOSIS — A6000 Herpesviral infection of urogenital system, unspecified: Secondary | ICD-10-CM | POA: Diagnosis not present

## 2023-12-08 MED ORDER — ETONOGESTREL-ETHINYL ESTRADIOL 0.12-0.015 MG/24HR VA RING: VAGINAL_RING | VAGINAL | 3 refills | Status: DC

## 2023-12-08 MED ORDER — VALACYCLOVIR HCL 500 MG PO TABS: 500.0000 mg | ORAL_TABLET | Freq: Every day | ORAL | 2 refills | Status: AC

## 2023-12-10 ENCOUNTER — Encounter: Payer: Self-pay | Admitting: Sports Medicine

## 2023-12-10 DIAGNOSIS — Z9889 Other specified postprocedural states: Secondary | ICD-10-CM

## 2023-12-12 LAB — CYTOLOGY - PAP
Comment: NEGATIVE
Diagnosis: NEGATIVE
High risk HPV: NEGATIVE

## 2023-12-13 ENCOUNTER — Encounter: Payer: Self-pay | Admitting: Obstetrics and Gynecology

## 2023-12-14 ENCOUNTER — Ambulatory Visit: Payer: 59 | Attending: Sports Medicine

## 2023-12-14 DIAGNOSIS — R29898 Other symptoms and signs involving the musculoskeletal system: Secondary | ICD-10-CM | POA: Diagnosis present

## 2023-12-14 DIAGNOSIS — G8929 Other chronic pain: Secondary | ICD-10-CM | POA: Insufficient documentation

## 2023-12-14 DIAGNOSIS — M25512 Pain in left shoulder: Secondary | ICD-10-CM | POA: Diagnosis present

## 2023-12-14 DIAGNOSIS — M6281 Muscle weakness (generalized): Secondary | ICD-10-CM | POA: Insufficient documentation

## 2023-12-14 DIAGNOSIS — M25562 Pain in left knee: Secondary | ICD-10-CM | POA: Diagnosis present

## 2023-12-14 NOTE — Therapy (Signed)
OUTPATIENT PHYSICAL THERAPY TREATMENT   Patient Name: Whitney Turner MRN: [id number] DOB:1975-12-03, 48 y.o., female Today's Date: 12/14/2023  END OF SESSION:  PT End of Session - 12/14/23 0804     Visit Number 9    Number of Visits 16    Date for PT Re-Evaluation 01/07/24    Authorization Type [employer]- 20 visits per year    PT Start Time 0804    PT Stop Time 0845    PT Time Calculation (min) 41 min    Activity Tolerance Patient tolerated treatment well    Behavior During Therapy [facility] for tasks assessed/performed                  Past Medical History:  Diagnosis Date   Allergy    Heart murmur    Herpes    Past Surgical History:  Procedure Laterality Date   HERNIA REPAIR  1982   KNEE SURGERY  08/2011   TONSILECTOMY/ADENOIDECTOMY WITH MYRINGOTOMY  2000   Patient Active Problem List   Diagnosis Date Noted   Tibialis posterior tendinitis, left 03/10/2023   Mouth ulcer 03/08/2023   Sore throat 03/08/2023   Poor concentration 12/30/2020   Hyperactive behavior 12/30/2020   Chronic left shoulder pain 11/17/2020   Elevated LDL cholesterol level 12/24/2019   Right lower quadrant pain 12/24/2019   Right upper quadrant pain 12/24/2019   Effusion of right knee 06/25/2019   Hemorrhoids 12/04/2018   CIN I (cervical intraepithelial neoplasia I) 04/03/2018   Lateral epicondylitis of left elbow 02/15/2018   Left hand pain 02/23/2016   History of repair of ACL of Left knee 01/26/2016   Plantar fasciitis, bilateral 12/29/2015   Genital herpes 12/01/2012   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/2012   Allergic rhinitis 12/01/2012   Seasonal allergies 12/01/2012    PCP: Jomarie Longs, PA-C  REFERRING PROVIDER: Monica Becton, MD    REFERRING DIAG:  [phone] (ICD-10-CM) - History of repair of ACL  [phone] (ICD-10-CM) - Chronic left shoulder pain    THERAPY DIAG:  Chronic left shoulder pain  Muscle weakness (generalized)  Chronic pain of left knee  Other symptoms and  signs involving the musculoskeletal system  Rationale for Evaluation and Treatment: Rehabilitation  ONSET DATE: knee- acute on chronic October 2024; shoulder- 2021  SUBJECTIVE:   SUBJECTIVE STATEMENT: Patient cycled for 30 minutes at the end of December and the next day did 3 leg exercises and had a lot of swelling/pain in the knee following this. She saw Dr. Karie Schwalbe and had an MRI (see findings below). She isn't really having pain right now in the knee, but it's more the instability. 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   EVAL: She reports multiple issues noting tightness in her neck/shoulders/Rt hip, Lt shoulder and Lt knee pain. She had had dry needling before and feels that this has been helpful in reducing her pain. She was hiking and thinks she might have hyperextended the left knee on 09/22/23. She thinks that she aggravated her meniscus. She denies any popping/clicking. It feels unstable and occasionally aches. 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 She has not been running or squatting since October, but wants to be able to these things. Patient reports during COVID she worked at [employer] and was lifting heavy bags overhead and thinks that when she injured her shoulder. Sometimes the shoulder just hurts, primarily when she is sleeping (she  sleeps on her stomach). She had injections in both the shoulder and knee at recent MD visit. The injections have helped to reduce her pain.  PERTINENT HISTORY: ACLR Lt 2011 per patient hamstring autograft  PAIN:  Are you having pain? Yes: NPRS scale: 4/10 Pain location: shoulders, back  Pain description: muscle sore Aggravating factors: from working out  Relieving factors: rest  PRECAUTIONS: None   WEIGHT BEARING RESTRICTIONS: No  FALLS:  Has patient fallen in last 6 months? No  LIVING ENVIRONMENT: Lives with: lives with their family Lives in: House/apartment Stairs:  Yes: Internal: multiple flights steps; on right going up Has following equipment at home: None  OCCUPATION: self-employed; sports official (softball, volleyball, and swimming); Printmaker   PATIENT GOALS: "I want to get stronger and get rid of tightness."   NEXT MD VISIT: 11/17/23  OBJECTIVE:  Note: Objective measures were completed at Evaluation unless otherwise noted.  DIAGNOSTIC FINDINGS: Lt shoulder MRI:   IMPRESSION: 1. Mild tendinosis of the supraspinatus and infraspinatus tendon without a tear. 2. Mild arthropathy of the acromioclavicular joint with marrow edema on either side of the joint.  Lt knee MRI: IMPRESSION: 1. Small vertically oriented tear extending through the superior and inferior articular surfaces of the free edge of the root of the posterior horn of the medial meniscus. Vertically oriented tear extending through the inferior articular surface of the peripheral third of the meniscal triangle of the more medial aspect of the posterior horn of the medial meniscus. Tiny horizontal linear tear extending through the superior articular surface and the peripheral wall of the mid to anterior aspect of the body of the medial meniscus. 2. Full-thickness cartilage defect within the inferior aspect of the medial trochlea with subchondral cystic change. 3. Full-thickness cartilage loss within the mid transverse, posterior weight-bearing lateral femoral condyle extending into the posterior nonweightbearing lateral femoral condyle. Focal defect within the adjacent posterolateral tibial plateau cartilage. 4. At the distal aspect of the tibial tunnel for the ACL reconstruction, there is mild fluid bright signal bordering the medial cortex of the proximal tibial metaphysis. There is also mild-to-moderate marrow edema surrounding the distal tibial tunnel in this region. This may represent mild stress reaction.  PATIENT SURVEYS:  FOTO shoulder: 70% function to  72% predicted; knee: 67% function to 75% predicted 11/10/23: Shoulder: 78% function; knee 75% function   SENSATION: Not tested  EDEMA:  No obvious swelling about the Lt knee or shoulder   MUSCLE LENGTH: Hamstrings: WNL  POSTURE: rounded shoulders and forward head  PALPATION: TTP Lt patellar tendon  Shoulder not assessed   10/31/23: Lt anterior rotated innominate   SPECIAL TESTS:  (+) trendelenberg   (-) Valgus, Varus, Anterior Drawer No time to complete shoulder special tests   FUNCTIONAL TESTS:  Squat: WNL SL squat: valgus, LOB, trunk flexion   GAIT: Distance walked: 10 ft  Assistive device utilized: None Level of assistance: Complete Independence Comments: WNL  LOWER EXTREMITY ROM:  Active ROM Right eval Left eval  Hip flexion    Hip extension    Hip abduction    Hip adduction    Hip internal rotation    Hip external rotation    Knee flexion Full  Full pain   Knee extension Full  Full   Ankle dorsiflexion    Ankle plantarflexion    Ankle inversion    Ankle eversion     (Blank rows = not tested)  LOWER EXTREMITY MMT:  MMT Right eval Left eval 11/21/23  Hip flexion 4+ 4 Lt: 4+; Rt: 5   Hip extension 4+ 4 Rt:5; Lt: 4   Hip abduction 4 4 Lt:4; Rt: 5   Hip adduction     Hip internal rotation     Hip external rotation     Knee flexion 5 4+ Lt: 5  Knee extension 5 4+ pain  Lt: 5   Ankle dorsiflexion     Ankle plantarflexion     Ankle inversion     Ankle eversion      (Blank rows = not tested) CERVICAL ROM:   Active ROM A/PROM (deg) eval  Flexion Full   Extension Full   Right lateral flexion   Left lateral flexion   Right rotation 70  Left rotation 78   (Blank rows = not tested)  UPPER EXTREMITY ROM:  Active ROM Right eval Left eval  Shoulder flexion Full  Full pulling in lat   Shoulder extension    Shoulder abduction Full  Full   Shoulder adduction    Shoulder extension    Shoulder internal rotation  Full   Shoulder external  rotation  Full   Elbow flexion    Elbow extension    Wrist flexion    Wrist extension    Wrist ulnar deviation    Wrist radial deviation    Wrist pronation    Wrist supination     (Blank rows = not tested) UPPER EXTREMITY MMT:  MMT Right eval Left eval 11/21/23  Shoulder flexion 5 5   Shoulder extension     Shoulder abduction 5 5   Shoulder adduction     Shoulder extension     Shoulder internal rotation 5 5   Shoulder external rotation 5 4   Middle trapezius 4 4 Rt: 5; Lt: 4+  Lower trapezius 3+ 3+ Bilateral 4-   Elbow flexion     Elbow extension     Wrist flexion     Wrist extension     Wrist ulnar deviation     Wrist radial deviation     Wrist pronation     Wrist supination     Grip strength      (Blank rows = not tested)  [facility] Adult PT Treatment:                                                DATE: 12/14/23 Therapeutic Exercise: Updated HEP discussing frequency, sets, reps  Self Care: Discussed activities to avoid including running, jumping, deep squatting, cutting activities Discussed modifications for softball specific activity Recommendation on bracing options for the knee  Recommended to discuss surgical questions at consult including different surgical techniques.  Ice,compression, elevation to reduce swelling   [facility] Adult PT Treatment:                                                DATE: 11/21/23 Therapeutic Exercise: Lateral step down 6 inch step 2 x 10  Forward step down 4 inch 2 x 10  Opposite touchdown to kettlebell 2 x 10  BOSU squats 2 x 10  Lunge on BOSU to SLS x 10 each   Therapeutic Activity: Re-assessment to determine overall progress, educating patient on progress towards goals.  Self Care: Recommended to begin cardio workout at the gym on the bike.    [facility] Adult PT Treatment:                                                DATE: 11/10/23 Therapeutic Exercise: Fire hydrant x 10 each blue band  Donkey kicks x 10 each 5 lbs  SL squat  to table x 10 bilateral Split squat x 10 each with blue band pulling laterally x 10 each  Lateral lunge on slider 2 x 10 each  Dead lift attempted d/c due to poor form  Hip hinge at wall with foam roller x 10  Hip hinge in standing with dowel x 10  Updated HEP     [facility] Adult PT Treatment:                                                DATE: 11/07/23 Therapeutic Exercise: UBE level 4 x 2 min each fwd/bwd  Sidelying ER 2 x 10; 3#  Reviewed HEP discussing sets, reps, hold times.  Hip bridge with posterior pelvic tilt x 5   Prone T, W, Y x 10; x 10 with 2 lbs  Resisted rows 25 lbs 2 x 15  Supine lat pull x 10; 2# Updated HEP     PATIENT EDUCATION:  Education details: HEP verbal update to include BOSU squats; POC   Person educated: Patient Education method: Explanation Education comprehension: verbalized understanding  HOME EXERCISE PROGRAM: Access Code: [email or web address] URL: [email or web address] Date: 12/14/2023 Prepared by: Letitia Libra  Program Notes 2- 3 times weekly.   Exercises - Shoulder External Rotation and Scapular Retraction with Resistance  - 1 x daily - 7 x weekly - 2 sets - 10 reps - Shoulder W - External Rotation with Resistance  - 2 x daily - 7 x weekly - 1-2 sets - 10 reps - 5 sec  hold - Supine Chest Stretch on Foam Roll  - 2 x daily - 7 x weekly - 1 sets - 1 reps - 2-5 min  sec  hold - Hooklying Hamstring Stretch with Strap  - 2 x daily - 7 x weekly - 1 sets - 3 reps - 30 sec  hold - Cat Cow  - 2 x daily - 7 x weekly - 1 sets - 5-10 reps - 3-5 sec  hold - Cat Cow to Child's Pose  - 2 x daily - 7 x weekly - 1 sets - 3 reps - 30 sec  hold - Child's Pose with Thread the Needle  - 2 x daily - 7 x weekly - 1 sets - 3 reps - 30 sec  hold - Single Leg Quarter Squat with Swiss Ball at [employer]  - 1 x daily - 7 x weekly - 2 sets - 10 reps - Sidelying Shoulder ER with Towel and Dumbbell  - 1 x daily - 7 x weekly - 2 sets - 10 reps - Prone Middle Trapezius  Strengthening on Swiss Ball  - 1 x daily - 7 x weekly - 2 sets - 10 reps - Prone Shoulder W on [employer]  - 1 x daily - 7 x weekly - 2 sets - 10 reps -  Prone Lower Trapezius Strengthening on [employer] with Dumbbells  - 1 x daily - 7 x weekly - 2 sets - 10 reps - Quadruped Hip Extension with Mini Swiss Ball  - 1 x daily - 7 x weekly - 2 sets - 10 reps - Standing Hip Hinge with Dowel  - 1 x daily - 7 x weekly - 2 sets - 10 reps - Single Leg Bridge  - 1 x daily - 7 x weekly - 2 sets - 10 reps - Side Stepping with Resistance at Ankles  - 1 x daily - 7 x weekly - 2 sets - 10 reps - Sidelying Hip Circles  - 1 x daily - 7 x weekly - 2 sets - 10 reps - Quadruped Hip Abduction with Resistance Loop  - 1 x daily - 7 x weekly - 2 sets - 10 reps - Supine Hamstring Curl on Swiss Ball  - 1 x daily - 7 x weekly - 2 sets - 10 reps - Straight Leg Raise with External Rotation  - 2 x daily - 7 x weekly - 1 sets - 10 reps - 3-5 sec  hold - Standing Hip Abduction with Resistance at Ankles and Counter Support  - 1 x daily - 7 x weekly - 2 sets - 10 reps - Standing Hip Extension with Resistance at Ankles and Counter Support  - 1 x daily - 7 x weekly - 2 sets - 10 reps   ASSESSMENT:  CLINICAL IMPRESSION: Patient underwent MRI of the Lt knee due to increased pain/swelling following a workout at the end of December. See above for MRI findings. 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 Her biggest complaint now is knee instability. Today's session focused on discussing exercises to complete/avoid at the gym and revising HEP for appropriate strengthening given MRI findings.    EVAL: Patient is a 48 y.o. female who was seen today for physical therapy evaluation and treatment for chronic Lt knee and Lt shoulder pain. Upon assessment she is noted to have full Lt shoulder AROM reporting pulling/tightness at end range of shoulder flexion. Overall good  strength about the Lt shoulder with mild rotator cuff weakness. She does have periscapular weakness and postural abnormalities. Her knee has full ROM with patient reporting pain at end range of flexion. She has mild Lt knee weakness and bilateral hip weakness as well as aberrant SL squat mechanics. Patient will benefit from skilled PT to address the above stated deficits in order to optimize their function and assist in overall pain reduction.     OBJECTIVE IMPAIRMENTS: decreased activity tolerance, decreased knowledge of condition, difficulty walking, decreased strength, impaired UE functional use, improper body mechanics, postural dysfunction, and pain.   GOALS: Goals reviewed with patient? Yes  SHORT TERM GOALS: Target date: 11/02/2023     Patient will be independent and compliant with initial HEP.   Baseline: issued at eval  Goal status: MET  2.  Patient will demonstrate proper form with step down on the LLE without pain to signify improvements in knee stability.  Baseline: poor form with SL squat  11/07/23: proper form without pain  Goal status: MET  3. Patient will report 50% improvement in her pain to reduce current functional limitations  Baseline: 6/10 at eval  11/21/23: 80% improvement   Goal status: MET    LONG TERM GOALS: Target date: 01/07/24  Patient will demonstrate 5/5 middle  trap and 4/5 lower trap strength to improve postural stability.  Baseline: see above Goal status: partially met   2.  Patient will score at least 72% shoulder and 75% knee on FOTO to signify clinically meaningful improvement in functional abilities.   Baseline: see above Goal status: MET  3.  Patient will demonstrate 5/5 bilateral hip strength to improve stability about the chain with prolonged walking activity.  Baseline: see above Goal status: partially met   4.  Patient will be independent with advanced home program, including appropriate gym exercises in order to assist in management  of her chronic conditions.  Baseline: initial HEP issued  Goal status: progressing    PLAN:  PT FREQUENCY: 1x/week  PT DURATION: 6 weeks  PLANNED INTERVENTIONS: 97164- PT Re-evaluation, 97110-Therapeutic exercises, 97530- Therapeutic activity, [id number]- Neuromuscular re-education, 97535- Self Care, 86578- Manual therapy, [id number]- Aquatic Therapy, 97014- Electrical stimulation (unattended), [id number]- Electrical stimulation (manual), [id number]- Vasopneumatic device, [id number]- Ionotophoresis 4mg /ml Dexamethasone, Patient/Family education, Taping, Dry Needling, Cryotherapy, and Moist heat  PLAN FOR NEXT SESSION: further progress LE strengthening as appropriate.   Letitia Libra, PT, DPT, ATC 12/14/23 8:46 AM

## 2023-12-21 ENCOUNTER — Ambulatory Visit: Payer: 59

## 2023-12-21 DIAGNOSIS — M6281 Muscle weakness (generalized): Secondary | ICD-10-CM

## 2023-12-21 DIAGNOSIS — M25512 Pain in left shoulder: Secondary | ICD-10-CM | POA: Diagnosis not present

## 2023-12-21 DIAGNOSIS — R29898 Other symptoms and signs involving the musculoskeletal system: Secondary | ICD-10-CM

## 2023-12-21 DIAGNOSIS — G8929 Other chronic pain: Secondary | ICD-10-CM

## 2023-12-21 NOTE — Therapy (Signed)
OUTPATIENT PHYSICAL THERAPY TREATMENT  PHYSICAL THERAPY DISCHARGE SUMMARY  Visits from Start of Care: 10  Current functional level related to goals / functional outcomes: See goals below   Remaining deficits: Intermittent Lt knee pain/instability    Education / Equipment: See education below    Patient agrees to discharge. Patient goals were partially met. Patient is being discharged due to  maximized rehab potential and independent with HEP.  Patient Name: Whitney Turner MRN: [id number] DOB:[birth year], 48 y.o., female Today's Date: 12/21/2023  END OF SESSION:  PT End of Session - 12/21/23 0805     Visit Number 10    Number of Visits 16    Date for PT Re-Evaluation 01/07/24    Authorization Type [employer]- 20 visits per year    PT Start Time 0805    PT Stop Time 0845    PT Time Calculation (min) 40 min    Activity Tolerance Patient tolerated treatment well    Behavior During Therapy [facility] for tasks assessed/performed                   Past Medical History:  Diagnosis Date   Allergy    Heart murmur    Herpes    Past Surgical History:  Procedure Laterality Date   HERNIA REPAIR  1982   KNEE SURGERY  08/2011   TONSILECTOMY/ADENOIDECTOMY WITH MYRINGOTOMY  2000   Patient Active Problem List   Diagnosis Date Noted   Tibialis posterior tendinitis, left 03/10/2023   Mouth ulcer 03/08/2023   Sore throat 03/08/2023   Poor concentration 12/30/2020   Hyperactive behavior 12/30/2020   Chronic left shoulder pain 11/17/2020   Elevated LDL cholesterol level 12/24/2019   Right lower quadrant pain 12/24/2019   Right upper quadrant pain 12/24/2019   Effusion of right knee 06/25/2019   Hemorrhoids 12/04/2018   CIN I (cervical intraepithelial neoplasia I) 04/03/2018   Lateral epicondylitis of left elbow 02/15/2018   Left hand pain 02/23/2016   History of repair of ACL of Left knee 01/26/2016   Plantar fasciitis, bilateral 12/29/2015   Genital herpes 12/01/2012   Oral  herpes 12/01/2012   Allergic rhinitis 12/01/2012   Seasonal allergies 12/01/2012    PCP: Jomarie Longs, PA-C  REFERRING PROVIDER: Monica Becton, MD    REFERRING DIAG:  [phone] (ICD-10-CM) - History of repair of ACL  [phone] (ICD-10-CM) - Chronic left shoulder pain    THERAPY DIAG:  Chronic left shoulder pain  Muscle weakness (generalized)  Chronic pain of left knee  Other symptoms and signs involving the musculoskeletal system  Rationale for Evaluation and Treatment: Rehabilitation  ONSET DATE: knee- acute on chronic October 2024; shoulder- 2021  SUBJECTIVE:   SUBJECTIVE STATEMENT: Patient saw Dr. Everardo Pacific who has recommended surgery, but she is going to hold until the softball season is over. She was given a new brace, which has been helpful and plans to wear for the season. Was able to complete prescribed HEP without any knee issues. Upright bike did bother knee, but recumbent bike was ok.   EVAL: She reports multiple issues noting tightness in her neck/shoulders/Rt hip, Lt shoulder and Lt knee pain. She had had dry needling before and feels that this has been helpful in reducing her pain. She was hiking and thinks she might have hyperextended the left knee on 09/22/23. She thinks that she aggravated her meniscus. She denies any popping/clicking. It feels unstable and occasionally aches. 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 She has not been running or squatting since October, but wants to be able to these things. Patient reports during COVID she worked at [employer] and was lifting heavy bags overhead and thinks that when she injured her shoulder. Sometimes the shoulder just hurts, primarily when she is sleeping (she sleeps on her stomach). She had injections in both the shoulder and knee at recent MD visit. The injections have helped to reduce her pain.  PERTINENT HISTORY: ACLR Lt 2011 per patient hamstring autograft   PAIN:  Are you having pain? Yes: NPRS scale: 4/10 Pain location: Lt knee  Pain description: sore Aggravating factors: from working out  Relieving factors: rest  PRECAUTIONS: None   WEIGHT BEARING RESTRICTIONS: No  FALLS:  Has patient fallen in last 6 months? No  LIVING ENVIRONMENT: Lives with: lives with their family Lives in: House/apartment Stairs: Yes: Internal: multiple flights steps; on right going up Has following equipment at home: None  OCCUPATION: self-employed; sports official (softball, volleyball, and swimming); Printmaker   PATIENT GOALS: "I want to get stronger and get rid of tightness."   NEXT MD VISIT: 11/17/23  OBJECTIVE:  Note: Objective measures were completed at Evaluation unless otherwise noted.  DIAGNOSTIC FINDINGS: Lt shoulder MRI:   IMPRESSION: 1. Mild tendinosis of the supraspinatus and infraspinatus tendon without a tear. 2. Mild arthropathy of the acromioclavicular joint with marrow edema on either side of the joint.  Lt knee MRI: IMPRESSION: 1. Small vertically oriented tear extending through the superior and inferior articular surfaces of the free edge of the root of the posterior horn of the medial meniscus. Vertically oriented tear extending through the inferior articular surface of the peripheral third of the meniscal triangle of the more medial aspect of the posterior horn of the medial meniscus. Tiny horizontal linear tear extending through the superior articular surface and the peripheral wall of the mid to anterior aspect of the body of the medial meniscus. 2. Full-thickness cartilage defect within the inferior aspect of the medial trochlea with subchondral cystic change. 3. Full-thickness cartilage loss within the mid transverse, posterior weight-bearing lateral femoral condyle extending into the posterior nonweightbearing lateral femoral condyle. Focal defect within the adjacent posterolateral tibial plateau  cartilage. 4. At the distal aspect of the tibial tunnel for the ACL reconstruction, there is mild fluid bright signal bordering the medial cortex of the proximal tibial metaphysis. There is also mild-to-moderate marrow edema surrounding the distal tibial tunnel in this region. This may represent mild stress reaction.  PATIENT SURVEYS:  FOTO shoulder: 70% function to 72% predicted; knee: 67% function to 75% predicted 11/10/23: Shoulder: 78% function; knee 75% function   SENSATION: Not tested  EDEMA:  No obvious swelling about the Lt knee or shoulder   MUSCLE LENGTH: Hamstrings: WNL  POSTURE: rounded shoulders and forward head  PALPATION: TTP Lt patellar tendon  Shoulder not assessed   10/31/23: Lt anterior rotated innominate   SPECIAL TESTS:  (+) trendelenberg   (-) Valgus, Varus, Anterior Drawer No time to complete shoulder special tests   FUNCTIONAL TESTS:  Squat: WNL SL squat: valgus, LOB, trunk flexion   GAIT: Distance walked: 10 ft  Assistive device utilized: None Level of assistance: Complete Independence Comments: WNL  LOWER EXTREMITY ROM:  Active ROM Right eval Left eval  Hip flexion    Hip extension    Hip abduction    Hip adduction    Hip internal rotation    Hip external rotation  Knee flexion Full  Full pain   Knee extension Full  Full   Ankle dorsiflexion    Ankle plantarflexion    Ankle inversion    Ankle eversion     (Blank rows = not tested)  LOWER EXTREMITY MMT:  MMT Right eval Left eval 11/21/23 12/21/23  Hip flexion 4+ 4 Lt: 4+; Rt: 5  5 bilateral   Hip extension 4+ 4 Rt:5; Lt: 4  5 bilateral  Hip abduction 4 4 Lt:4; Rt: 5  5 bilateral   Hip adduction      Hip internal rotation      Hip external rotation      Knee flexion 5 4+ Lt: 5 5 bilateral  Knee extension 5 4+ pain  Lt: 5  5 bilateral   Ankle dorsiflexion      Ankle plantarflexion      Ankle inversion      Ankle eversion       (Blank rows = not tested) CERVICAL  ROM:   Active ROM A/PROM (deg) eval  Flexion Full   Extension Full   Right lateral flexion   Left lateral flexion   Right rotation 70  Left rotation 78   (Blank rows = not tested)  UPPER EXTREMITY ROM:  Active ROM Right eval Left eval  Shoulder flexion Full  Full pulling in lat   Shoulder extension    Shoulder abduction Full  Full   Shoulder adduction    Shoulder extension    Shoulder internal rotation  Full   Shoulder external rotation  Full   Elbow flexion    Elbow extension    Wrist flexion    Wrist extension    Wrist ulnar deviation    Wrist radial deviation    Wrist pronation    Wrist supination     (Blank rows = not tested) UPPER EXTREMITY MMT:  MMT Right eval Left eval 11/21/23 12/21/23  Shoulder flexion 5 5    Shoulder extension      Shoulder abduction 5 5    Shoulder adduction      Shoulder extension      Shoulder internal rotation 5 5    Shoulder external rotation 5 4    Middle trapezius 4 4 Rt: 5; Lt: 4+ 5 bilateral   Lower trapezius 3+ 3+ Bilateral 4-  4 bilateral   Elbow flexion      Elbow extension      Wrist flexion      Wrist extension      Wrist ulnar deviation      Wrist radial deviation      Wrist pronation      Wrist supination      Grip strength       (Blank rows = not tested) [facility] Adult PT Treatment:                                                DATE: 12/21/23 Therapeutic Exercise: Reviewed HEP discussing frequency,set, reps, and ways to progress   Therapeutic Activity: Re-assessment to determine overall progress, educating patient on progress towards goals.   Self Care: Discussed general post-operative progression specific to her impairments and where to find surgical post-operative protocols online.   Discussed activities to avoid including running, jumping, deep squatting, cutting activities  Recommended to utilize brace for umpiring activity.  Ice/compression as needed for  swelling/pain.  Cardio recommendations including  biking, treadmill walking, row machine.   [facility] Adult PT Treatment:                                                DATE: 12/14/23 Therapeutic Exercise: Updated HEP discussing frequency, sets, reps  Self Care: Discussed activities to avoid including running, jumping, deep squatting, cutting activities Discussed modifications for softball specific activity Recommendation on bracing options for the knee  Recommended to discuss surgical questions at consult including different surgical techniques.  Ice,compression, elevation to reduce swelling   [facility] Adult PT Treatment:                                                DATE: 11/21/23 Therapeutic Exercise: Lateral step down 6 inch step 2 x 10  Forward step down 4 inch 2 x 10  Opposite touchdown to kettlebell 2 x 10  BOSU squats 2 x 10  Lunge on BOSU to SLS x 10 each   Therapeutic Activity: Re-assessment to determine overall progress, educating patient on progress towards goals.   Self Care: Recommended to begin cardio workout at the gym on the bike.    PATIENT EDUCATION:  Education details: see treatment; d/c education  Person educated: Patient Education method: Explanation and Demonstration Education comprehension: verbalized understanding and returned demonstration  HOME EXERCISE PROGRAM: Access Code: [email or web address] URL: [email or web address] Date: 12/14/2023 Prepared by: Letitia Libra  Program Notes 2- 3 times weekly.   Exercises - Shoulder External Rotation and Scapular Retraction with Resistance  - 1 x daily - 7 x weekly - 2 sets - 10 reps - Shoulder W - External Rotation with Resistance  - 2 x daily - 7 x weekly - 1-2 sets - 10 reps - 5 sec  hold - Supine Chest Stretch on Foam Roll  - 2 x daily - 7 x weekly - 1 sets - 1 reps - 2-5 min  sec  hold - Hooklying Hamstring Stretch with Strap  - 2 x daily - 7 x weekly - 1 sets - 3 reps - 30 sec  hold - Cat Cow  - 2 x daily - 7 x weekly - 1 sets - 5-10 reps - 3-5 sec   hold - Cat Cow to Child's Pose  - 2 x daily - 7 x weekly - 1 sets - 3 reps - 30 sec  hold - Child's Pose with Thread the Needle  - 2 x daily - 7 x weekly - 1 sets - 3 reps - 30 sec  hold - Single Leg Quarter Squat with Swiss Ball at [employer]  - 1 x daily - 7 x weekly - 2 sets - 10 reps - Sidelying Shoulder ER with Towel and Dumbbell  - 1 x daily - 7 x weekly - 2 sets - 10 reps - Prone Middle Trapezius Strengthening on Swiss Ball  - 1 x daily - 7 x weekly - 2 sets - 10 reps - Prone Shoulder W on [employer]  - 1 x daily - 7 x weekly - 2 sets - 10 reps - Prone Lower Trapezius Strengthening on Swiss Ball with Dumbbells  - 1 x daily - 7 x weekly - 2 sets -  10 reps - Quadruped Hip Extension with Mini Swiss Ball  - 1 x daily - 7 x weekly - 2 sets - 10 reps - Standing Hip Hinge with Dowel  - 1 x daily - 7 x weekly - 2 sets - 10 reps - Single Leg Bridge  - 1 x daily - 7 x weekly - 2 sets - 10 reps - Side Stepping with Resistance at Ankles  - 1 x daily - 7 x weekly - 2 sets - 10 reps - Sidelying Hip Circles  - 1 x daily - 7 x weekly - 2 sets - 10 reps - Quadruped Hip Abduction with Resistance Loop  - 1 x daily - 7 x weekly - 2 sets - 10 reps - Supine Hamstring Curl on Swiss Ball  - 1 x daily - 7 x weekly - 2 sets - 10 reps - Straight Leg Raise with External Rotation  - 2 x daily - 7 x weekly - 1 sets - 10 reps - 3-5 sec  hold - Standing Hip Abduction with Resistance at Ankles and Counter Support  - 1 x daily - 7 x weekly - 2 sets - 10 reps - Standing Hip Extension with Resistance at Ankles and Counter Support  - 1 x daily - 7 x weekly - 2 sets - 10 reps   ASSESSMENT:  CLINICAL IMPRESSION: Kennis had recent surgical consult and was recommended surgery for her chronic knee pain/instability. Today's session focused on finalizing an HEP she can continue prior to surgical intervention, which she is planning for after the softball season. We discussed actitivies to avoid currently to preserve the knee as well as  ways to progress strengthening prior to surgical intervention. She demonstrates independence with advanced home program and is therefore appropriate for discharge at this time with patient in agreement with this plan.    EVAL: Patient is a 48 y.o. female who was seen today for physical therapy evaluation and treatment for chronic Lt knee and Lt shoulder pain. Upon assessment she is noted to have full Lt shoulder AROM reporting pulling/tightness at end range of shoulder flexion. Overall good strength about the Lt shoulder with mild rotator cuff weakness. She does have periscapular weakness and postural abnormalities. Her knee has full ROM with patient reporting pain at end range of flexion. She has mild Lt knee weakness and bilateral hip weakness as well as aberrant SL squat mechanics. Patient will benefit from skilled PT to address the above stated deficits in order to optimize their function and assist in overall pain reduction.     OBJECTIVE IMPAIRMENTS: decreased activity tolerance, decreased knowledge of condition, difficulty walking, decreased strength, impaired UE functional use, improper body mechanics, postural dysfunction, and pain.   GOALS: Goals reviewed with patient? Yes  SHORT TERM GOALS: Target date: 11/02/2023     Patient will be independent and compliant with initial HEP.   Baseline: issued at eval  Goal status: MET  2.  Patient will demonstrate proper form with step down on the LLE without pain to signify improvements in knee stability.  Baseline: poor form with SL squat  11/07/23: proper form without pain  Goal status: MET  3. Patient will report 50% improvement in her pain to reduce current functional limitations  Baseline: 6/10 at eval  11/21/23: 80% improvement   12/21/23: regression due to knee instability/pain  Goal status: previously met     LONG TERM GOALS: Target date: 01/07/24  Patient will demonstrate 5/5 middle trap and 4/5  lower trap strength to improve  postural stability.  Baseline: see above Goal status: MET  2.  Patient will score at least 72% shoulder and 75% knee on FOTO to signify clinically meaningful improvement in functional abilities.   Baseline: see above Goal status: MET  3.  Patient will demonstrate 5/5 bilateral hip strength to improve stability about the chain with prolonged walking activity.  Baseline: see above Goal status: MET  4.  Patient will be independent with advanced home program, including appropriate gym exercises in order to assist in management of her chronic conditions.  Baseline: initial HEP issued  Goal status: MET   PLAN:  PT FREQUENCY: 1x/week  PT DURATION: 6 weeks  PLANNED INTERVENTIONS: 97164- PT Re-evaluation, 97110-Therapeutic exercises, 97530- Therapeutic activity, [id number]- Neuromuscular re-education, 97535- Self Care, 16109- Manual therapy, [id number]- Aquatic Therapy, 97014- Electrical stimulation (unattended), [id number]- Electrical stimulation (manual), [id number]- Vasopneumatic device, [id number]- Ionotophoresis 4mg /ml Dexamethasone, Patient/Family education, Taping, Dry Needling, Cryotherapy, and Moist heat  PLAN FOR NEXT SESSION: n/a d/c   Letitia Libra, PT, DPT, ATC 12/21/23 10:31 AM

## 2023-12-23 ENCOUNTER — Other Ambulatory Visit: Payer: Self-pay | Admitting: *Deleted

## 2023-12-23 MED ORDER — ETONOGESTREL-ETHINYL ESTRADIOL 0.12-0.015 MG/24HR VA RING: VAGINAL_RING | VAGINAL | 3 refills | Status: AC

## 2023-12-23 MED ORDER — HYDROCODONE-ACETAMINOPHEN 5-325 MG PO TABS: 1.0000 | ORAL_TABLET | Freq: Three times a day (TID) | ORAL | 0 refills | Status: DC | PRN

## 2023-12-23 NOTE — Addendum Note (Signed)
Addended by: Monica Becton on: 12/23/2023 03:48 PM   Modules accepted: Orders

## 2024-02-21 ENCOUNTER — Other Ambulatory Visit: Payer: Self-pay | Admitting: Sports Medicine

## 2024-02-21 ENCOUNTER — Encounter (INDEPENDENT_AMBULATORY_CARE_PROVIDER_SITE_OTHER): Payer: Self-pay | Admitting: Sports Medicine

## 2024-02-21 DIAGNOSIS — G8929 Other chronic pain: Secondary | ICD-10-CM | POA: Diagnosis not present

## 2024-02-21 DIAGNOSIS — M7542 Impingement syndrome of left shoulder: Secondary | ICD-10-CM

## 2024-02-21 DIAGNOSIS — M25552 Pain in left hip: Secondary | ICD-10-CM

## 2024-02-27 ENCOUNTER — Ambulatory Visit

## 2024-02-27 DIAGNOSIS — G8929 Other chronic pain: Secondary | ICD-10-CM | POA: Diagnosis not present

## 2024-02-27 DIAGNOSIS — M25552 Pain in left hip: Secondary | ICD-10-CM

## 2024-02-27 NOTE — Telephone Encounter (Signed)

## 2024-03-05 ENCOUNTER — Ambulatory Visit: Admitting: Sports Medicine

## 2024-03-05 ENCOUNTER — Encounter: Payer: Self-pay | Admitting: Sports Medicine

## 2024-03-05 VITALS — Wt 137.0 lb

## 2024-03-05 DIAGNOSIS — G8929 Other chronic pain: Secondary | ICD-10-CM

## 2024-03-05 DIAGNOSIS — R635 Abnormal weight gain: Secondary | ICD-10-CM | POA: Diagnosis not present

## 2024-03-05 DIAGNOSIS — M25552 Pain in left hip: Secondary | ICD-10-CM | POA: Diagnosis not present

## 2024-03-05 MED ORDER — TIRZEPATIDE 10 MG/0.5ML ~~LOC~~ SOAJ: SUBCUTANEOUS | 11 refills | Status: DC

## 2024-03-05 NOTE — Assessment & Plan Note (Signed)
 This is a very pleasant 48 year old female, she has been having some pain left hip worse after cycling localized laterally. On exam she does have minimal tenderness at the greater trochanter, she does have fairly strong hip abductors however she is subjectively weak on the left compared to the right. We discussed the anatomy and pathophysiology, she will work on conditioning and aggressive hip abductor strengthening at home, she can return to see me as needed for this.

## 2024-03-05 NOTE — Progress Notes (Signed)
    Procedures performed today:    None.  Independent interpretation of notes and tests performed by another provider:   None.  Brief History, Exam, Impression, and Recommendations:    Chronic left hip pain This is a very pleasant 48 year old female, she has been having some pain left hip worse after cycling localized laterally. On exam she does have minimal tenderness at the greater trochanter, she does have fairly strong hip abductors however she is subjectively weak on the left compared to the right. We discussed the anatomy and pathophysiology, she will work on conditioning and aggressive hip abductor strengthening at home, she can return to see me as needed for this.   Abnormal weight gain Zanita has progressively gained weight, she is dieting, she is doing exercise but still not noticing improvements, we will start some low-dose compounded tirzepatide, she will see me back after a month to go over tolerance and weight loss.    ____________________________________________ Joselyn Nicely. Sandy Crumb, M.D., ABFM., CAQSM., AME. Primary Care and Sports Medicine  [facility]  Adjunct Professor of [facility] Medicine  [employer]  Restaurant manager, fast food

## 2024-03-05 NOTE — Assessment & Plan Note (Signed)
 Whitney Turner has progressively gained weight, she is dieting, she is doing exercise but still not noticing improvements, we will start some low-dose compounded tirzepatide, she will see me back after a month to go over tolerance and weight loss.

## 2024-03-13 ENCOUNTER — Encounter: Payer: Self-pay | Admitting: Physician Assistant

## 2024-03-14 MED ORDER — EPINEPHRINE 0.3 MG/0.3ML IJ SOAJ: 0.3000 mg | INTRAMUSCULAR | 1 refills | Status: AC | PRN

## 2024-03-14 MED ORDER — METHYLPREDNISOLONE 4 MG PO TBPK: ORAL_TABLET | ORAL | 0 refills | Status: DC

## 2024-03-30 ENCOUNTER — Other Ambulatory Visit: Payer: Self-pay | Admitting: Sports Medicine

## 2024-03-30 DIAGNOSIS — M7542 Impingement syndrome of left shoulder: Secondary | ICD-10-CM

## 2024-04-02 ENCOUNTER — Ambulatory Visit: Admitting: Sports Medicine

## 2024-04-02 ENCOUNTER — Encounter: Payer: Self-pay | Admitting: Sports Medicine

## 2024-04-02 VITALS — BP 97/66 | HR 79 | Resp 20 | Ht 63.0 in | Wt 130.0 lb

## 2024-04-02 DIAGNOSIS — R635 Abnormal weight gain: Secondary | ICD-10-CM

## 2024-04-02 NOTE — Progress Notes (Signed)
    Procedures performed today:    None.  Independent interpretation of notes and tests performed by another provider:   None.  Brief History, Exam, Impression, and Recommendations:    Abnormal weight gain Whitney Turner returns, we started compounded tirzepatide and she lost approximately 7 to 8 pounds. No adverse effects, she can stay at the initial dose for now unless she plateaus. Her goal weight is 118, she does also agree to get in the gym and do some additional resistance training. We also did the exercise prescription, with a target heart rate of 150, she will keep it there 5 times a week for 30 minutes each.    ____________________________________________ Joselyn Nicely. Sandy Crumb, M.D., ABFM., CAQSM., AME. Primary Care and Sports Medicine [facility]  Adjunct Professor of [facility] Medicine  [employer]  Restaurant manager, fast food

## 2024-04-02 NOTE — Assessment & Plan Note (Signed)
 Whitney Turner returns, we started compounded tirzepatide and she lost approximately 7 to 8 pounds. No adverse effects, she can stay at the initial dose for now unless she plateaus. Her goal weight is 118, she does also agree to get in the gym and do some additional resistance training. We also did the exercise prescription, with a target heart rate of 150, she will keep it there 5 times a week for 30 minutes each.

## 2024-04-10 ENCOUNTER — Encounter: Payer: Self-pay | Admitting: Sports Medicine

## 2024-04-28 ENCOUNTER — Other Ambulatory Visit: Payer: Self-pay | Admitting: Sports Medicine

## 2024-04-28 DIAGNOSIS — M7542 Impingement syndrome of left shoulder: Secondary | ICD-10-CM

## 2024-05-07 ENCOUNTER — Encounter (INDEPENDENT_AMBULATORY_CARE_PROVIDER_SITE_OTHER): Admitting: Sports Medicine

## 2024-05-07 DIAGNOSIS — M25569 Pain in unspecified knee: Secondary | ICD-10-CM

## 2024-05-08 NOTE — Telephone Encounter (Signed)

## 2024-05-09 NOTE — Therapy (Signed)
 OUTPATIENT PHYSICAL THERAPY LOWER EXTREMITY EVALUATION   Patient Name: Inioluwa Boulay MRN: [id number] DOB:July 31, 1976, 48 y.o., female Today's Date: 05/10/2024  END OF SESSION:  PT End of Session - 05/10/24 1404     Visit Number 1    Number of Visits 25    Date for PT Re-Evaluation 08/04/24    Authorization Type [employer]    PT Start Time 1404    PT Stop Time 1443    PT Time Calculation (min) 39 min    Equipment Utilized During Treatment Left knee immobilizer    Activity Tolerance Patient tolerated treatment well    Behavior During Therapy WFL for tasks assessed/performed          Past Medical History:  Diagnosis Date   Allergy    Heart murmur    Herpes    Past Surgical History:  Procedure Laterality Date   HERNIA REPAIR  1982   KNEE SURGERY  08/2011   TONSILECTOMY/ADENOIDECTOMY WITH MYRINGOTOMY  2000   Patient Active Problem List   Diagnosis Date Noted   Abnormal weight gain 03/05/2024   Chronic left hip pain 02/27/2024   Tibialis posterior tendinitis, left 03/10/2023   Mouth ulcer 03/08/2023   Sore throat 03/08/2023   Poor concentration 12/30/2020   Hyperactive behavior 12/30/2020   Chronic left shoulder pain 11/17/2020   Elevated LDL cholesterol level 12/24/2019   Right lower quadrant pain 12/24/2019   Right upper quadrant pain 12/24/2019   Effusion of right knee 06/25/2019   Hemorrhoids 12/04/2018   CIN I (cervical intraepithelial neoplasia I) 04/03/2018   Lateral epicondylitis of left elbow 02/15/2018   Left hand pain 02/23/2016   History of repair of ACL of Left knee 01/26/2016   Plantar fasciitis, bilateral 12/29/2015   Genital herpes 12/01/2012   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/2012   Allergic rhinitis 12/01/2012   Seasonal allergies 12/01/2012    PCP: Kita Perish   REFERRING PROVIDER: Micheline Ahr, MD  REFERRING DIAG: left knee arthroscopy with medial meniscectomy and revision ACL reconstruction with allograft 6/16  THERAPY DIAG:  Acute pain of left  knee  Muscle weakness (generalized)  Other abnormalities of gait and mobility  Localized edema  Rationale for Evaluation and Treatment: Rehabilitation  ONSET DATE: 05/07/24  SUBJECTIVE:   SUBJECTIVE STATEMENT: Patient reports the knee is feeling good. She is taking anti-inflammatory as needed. She has not put any weight on the LLE yet even though her paperwork says she can. She has tried icing,but due to bandaging doesn't feel like this has helped. 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   PERTINENT HISTORY: left knee arthroscopy with medial meniscectomy and revision ACL reconstruction with allograft 05/07/24 Previous LT ACLR  PAIN:  Are you having pain? Yes: NPRS scale: none currently; at worst 6 Pain location: lt lateral knee Pain description: pulling Aggravating factors: unknown, putting weight on it Relieving factors: rest  PRECAUTIONS: Other: see protocol   RED FLAGS: None   WEIGHT BEARING RESTRICTIONS: Yes LLE WBAT in knee immobilizer   FALLS:  Has patient fallen in last 6 months? No  LIVING ENVIRONMENT: Lives with: lives with their family Lives in: House/apartment Stairs: Yes: Internal: flight steps; on right going up and External: 3 steps; none Has following equipment at home: Crutches, knee immobilizer  OCCUPATION: softball umpire   PLOF: Independent  PATIENT GOALS: full range of motion and be able to jog.  NEXT MD VISIT: 05/15/24  OBJECTIVE:  Note: Objective measures were completed at Evaluation unless otherwise noted.  DIAGNOSTIC FINDINGS: IMPRESSION: 1. Small vertically oriented tear extending through the superior and inferior articular surfaces of the free edge of the root of the posterior horn of the medial meniscus. Vertically oriented tear extending through the inferior articular surface of the peripheral third of the meniscal triangle of the more medial aspect of  the posterior horn of the medial meniscus. Tiny horizontal linear tear extending through the superior articular surface and the peripheral wall of the mid to anterior aspect of the body of the medial meniscus. 2. Full-thickness cartilage defect within the inferior aspect of the medial trochlea with subchondral cystic change. 3. Full-thickness cartilage loss within the mid transverse, posterior weight-bearing lateral femoral condyle extending into the posterior nonweightbearing lateral femoral condyle. Focal defect within the adjacent posterolateral tibial plateau cartilage. 4. At the distal aspect of the tibial tunnel for the ACL reconstruction, there is mild fluid bright signal bordering the medial cortex of the proximal tibial metaphysis. There is also mild-to-moderate marrow edema surrounding the distal tibial tunnel in this region. This may represent mild stress reaction.  PATIENT SURVEYS:  LEFS: 12/80  COGNITION: Overall cognitive status: Within functional limits for tasks assessed     SENSATION: Not tested  EDEMA:  Moderate swelling about Lt knee     PALPATION: Not assessed   LOWER EXTREMITY ROM:  Active ROM Right eval Left eval  Hip flexion    Hip extension    Hip abduction    Hip adduction    Hip internal rotation    Hip external rotation    Knee flexion  45  Knee extension  Lacking 5  Ankle dorsiflexion    Ankle plantarflexion    Ankle inversion    Ankle eversion     (Blank rows = not tested)  LOWER EXTREMITY MMT:   MMT Right eval Left eval  Hip flexion    Hip extension    Hip abduction    Hip adduction    Hip internal rotation    Hip external rotation    Knee flexion  deferred  Knee extension  Deferred   Ankle dorsiflexion    Ankle plantarflexion    Ankle inversion    Ankle eversion     (Blank rows = not tested)    FUNCTIONAL TESTS:  Not assessed   GAIT: Distance walked: 10 ft  Assistive device utilized: Crutches Level of  assistance: Modified independence Comments: NWB LLE in knee immobilizer                                                                                                                                 [facility] Adult PT Treatment:  DATE: 05/10/24 Therapeutic Exercise: Demonstrated,performed, and issued initial HEP.   Gait training: With axillary crutches allowing for WBAT on the LLE Stair training with axillary crutches step to pattern   Self Care: Ice for pain/swelling Reviewed protocol Signs/symptoms of infection     PATIENT EDUCATION:  Education details: see treatment; POC Person educated: Patient Education method: Explanation, Demonstration, Tactile cues, Verbal cues, and Handouts Education comprehension: verbalized understanding, returned demonstration, verbal cues required, tactile cues required, and needs further education  HOME EXERCISE PROGRAM: Access Code: [email or web address] URL: [email or web address] Date: 05/10/2024 Prepared by: Forrestine Ike  Exercises - Long Sitting Quad Set  - 3 x daily - 7 x weekly - 2 sets - 10 reps - 5 sec  hold - Long Sitting Calf Stretch with Strap  - 3 x daily - 7 x weekly - 3 sets - 30 sec  hold - Supine Heel Slide  - 3 x daily - 7 x weekly - 1 sets - 10 reps - Side to Side Weight Shift with Counter Support  - 3 x daily - 7 x weekly - 2 sets - 10 reps  ASSESSMENT:  CLINICAL IMPRESSION: Patient is a 48 y.o. female who was seen today for physical therapy evaluation and treatment for s/p Lt knee arthroscopy with medial meniscectomy and revision ACL reconstruction with allograft on 05/07/24. She demonstrates ROM, strength, gait and balance deficits that are consistent with her recent post-operative status. She will benefit from skilled PT to address the above stated deficits in order to return to optimal function.   OBJECTIVE IMPAIRMENTS: Abnormal gait, decreased activity tolerance, decreased  balance, decreased endurance, decreased mobility, difficulty walking, decreased ROM, decreased strength, increased edema, impaired flexibility, improper body mechanics, and pain.   ACTIVITY LIMITATIONS: carrying, lifting, bending, sitting, standing, squatting, sleeping, stairs, transfers, bed mobility, bathing, dressing, hygiene/grooming, and locomotion level  PARTICIPATION LIMITATIONS: meal prep, cleaning, laundry, driving, shopping, community activity, occupation, and yard work  PERSONAL FACTORS: Age, Fitness, Profession, Time since onset of injury/illness/exacerbation, and 1-2 comorbidities: lt knee surgery x 2  are also affecting patient's functional outcome.   REHAB POTENTIAL: Good  CLINICAL DECISION MAKING: Stable/uncomplicated  EVALUATION COMPLEXITY: Low   GOALS: Goals reviewed with patient? Yes  SHORT TERM GOALS: Target date: 06/21/2024   Patient will be independent and compliant with initial HEP.   Baseline: issued at eval Goal status: INITIAL  2.  Patient will be able to perform LLE SLR x 10 without quad lag indicative of improved knee stability.  Baseline: unable Goal status: INITIAL  3.  Patient will demonstrate at least 100 degrees of Lt knee flexion AROM to improve ability to complete sit to stand transfers.  Baseline: see above Goal status: INITIAL  4.  Patient will demonstrate full Lt knee extension AROM to improve gait mechanics.  Baseline: see above Goal status: INITIAL  5.  Patient will ambulate community distances without AD without Lt knee pain.  Baseline: WBAT LLE Goal status: INITIAL    LONG TERM GOALS: Target date: 08/04/24  Patient will demonstrate 5/5 Lt knee strength to improve stability with curb/stair negotiation.  Baseline: not assessed.  Goal status: INITIAL  2.  Patient will demonstrate normalized squat mechanics without pain.  Baseline: unable Goal status: INITIAL  3.  Patient will score >/= 50/80 on the LEFS (MCID is 9) to signify  clinically meaningful improvement in functional abilities.   Baseline: see above  Goal status: INITIAL  4.  Patient will demonstrate at least 120 degrees of Lt knee  flexion AROM to improve ability to complete bending activity.  Baseline: see above Goal status: INITIAL  5.  Patient will be independent with advanced home program to progress strength phase of rehab independently.  Baseline: initial HEP issued  Goal status: INITIAL   PLAN:  PT FREQUENCY: 2x/week  PT DURATION: 12 weeks  PLANNED INTERVENTIONS: 97164- PT Re-evaluation, 97750- Physical Performance Testing, 97110-Therapeutic exercises, 97530- Therapeutic activity, [id number]- Neuromuscular re-education, 97535- Self Care, 82956- Manual therapy, [id number]- Gait training, [phone]- Electrical stimulation (unattended), [id number]- Electrical stimulation (manual), 97016- Vasopneumatic device, 20560 (1-2 muscles), 20561 (3+ muscles)- Dry Needling, Cryotherapy, and Moist heat  PLAN FOR NEXT SESSION: progress per protocol; focus on progressing to full weightbearing and quad activation. Game ready    Saphyra Hutt, PT, DPT, ATC 05/10/24 4:32 PM

## 2024-05-10 ENCOUNTER — Other Ambulatory Visit: Payer: Self-pay

## 2024-05-10 ENCOUNTER — Ambulatory Visit: Attending: Orthopaedic Surgery

## 2024-05-10 DIAGNOSIS — R600 Localized edema: Secondary | ICD-10-CM | POA: Diagnosis present

## 2024-05-10 DIAGNOSIS — M6281 Muscle weakness (generalized): Secondary | ICD-10-CM | POA: Insufficient documentation

## 2024-05-10 DIAGNOSIS — M25562 Pain in left knee: Secondary | ICD-10-CM | POA: Insufficient documentation

## 2024-05-10 DIAGNOSIS — R2689 Other abnormalities of gait and mobility: Secondary | ICD-10-CM | POA: Insufficient documentation

## 2024-05-17 ENCOUNTER — Ambulatory Visit: Admitting: Rehabilitative and Restorative Service Providers"

## 2024-05-17 ENCOUNTER — Encounter: Payer: Self-pay | Admitting: Rehabilitative and Restorative Service Providers"

## 2024-05-17 DIAGNOSIS — M25562 Pain in left knee: Secondary | ICD-10-CM

## 2024-05-17 DIAGNOSIS — M6281 Muscle weakness (generalized): Secondary | ICD-10-CM

## 2024-05-17 DIAGNOSIS — R2689 Other abnormalities of gait and mobility: Secondary | ICD-10-CM

## 2024-05-17 DIAGNOSIS — R600 Localized edema: Secondary | ICD-10-CM

## 2024-05-17 NOTE — Therapy (Signed)
 OUTPATIENT PHYSICAL THERAPY LOWER EXTREMITY EVALUATION   Patient Name: Sunita Demond MRN: [id number] DOB:08/09/1976, 48 y.o., female Today's Date: 05/17/2024  END OF SESSION:  PT End of Session - 05/17/24 1319     Visit Number 2    Number of Visits 25    Date for PT Re-Evaluation 08/04/24    Authorization Type [employer]    PT Start Time 1318    PT Stop Time 1400    PT Time Calculation (min) 42 min    Activity Tolerance Patient tolerated treatment well          Past Medical History:  Diagnosis Date   Allergy    Heart murmur    Herpes    Past Surgical History:  Procedure Laterality Date   HERNIA REPAIR  1982   KNEE SURGERY  08/2011   TONSILECTOMY/ADENOIDECTOMY WITH MYRINGOTOMY  2000   Patient Active Problem List   Diagnosis Date Noted   Abnormal weight gain 03/05/2024   Chronic left hip pain 02/27/2024   Tibialis posterior tendinitis, left 03/10/2023   Mouth ulcer 03/08/2023   Sore throat 03/08/2023   Poor concentration 12/30/2020   Hyperactive behavior 12/30/2020   Chronic left shoulder pain 11/17/2020   Elevated LDL cholesterol level 12/24/2019   Right lower quadrant pain 12/24/2019   Right upper quadrant pain 12/24/2019   Effusion of right knee 06/25/2019   Hemorrhoids 12/04/2018   CIN I (cervical intraepithelial neoplasia I) 04/03/2018   Lateral epicondylitis of left elbow 02/15/2018   Left hand pain 02/23/2016   History of repair of ACL of Left knee 01/26/2016   Plantar fasciitis, bilateral 12/29/2015   Genital herpes 12/01/2012   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/2012   Allergic rhinitis 12/01/2012   Seasonal allergies 12/01/2012    PCP: Antoniette Vermell LITTIE DEVONNA   REFERRING PROVIDER: Bonner Cristy DASEN, MD  REFERRING DIAG: left knee arthroscopy with medial meniscectomy and revision ACL reconstruction with allograft 6/16  THERAPY DIAG:  Acute pain of left knee  Muscle weakness (generalized)  Other abnormalities of gait and mobility  Localized edema  Rationale for  Evaluation and Treatment: Rehabilitation  ONSET DATE: 05/07/24  SUBJECTIVE:   SUBJECTIVE STATEMENT: Patient reports the knee is feeling good. She is taking anti-inflammatory as needed. She is out of the brace and in a small knee brace prn. She is not using the brace at home. Working on exercises at home. Walking without assistive device. Saw MD Tuesday and is released to walk full weight bearing. She can not swim until next visit with MD 06/05/24. No weight lifting. She is still using ice and has an ice machine. Using ice 4-5 times a day. 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   PERTINENT HISTORY: left knee arthroscopy with medial meniscectomy and revision ACL reconstruction with allograft 05/07/24 Previous LT ACLR  PAIN:  Are you having pain? Yes: NPRS scale: none currently; at worst 4 Pain location: lt lateral knee Pain description: pulling Aggravating factors: unknown, putting weight on it Relieving factors: rest  PRECAUTIONS: Other: see protocol    WEIGHT BEARING RESTRICTIONS: Yes LLE WBAT in knee immobilizer   FALLS:  Has patient fallen in last 6 months? No  LIVING ENVIRONMENT: Lives with: lives with their family Lives in: House/apartment Stairs: Yes: Internal: flight steps; on right going up and External: 3 steps; none Has following equipment at home: Crutches, knee immobilizer  OCCUPATION: softball umpire  PATIENT GOALS: full range of motion and be able to jog.   NEXT MD VISIT: 05/15/24  OBJECTIVE:  Note: Objective measures were completed at Evaluation unless otherwise noted.  DIAGNOSTIC FINDINGS: IMPRESSION: 1. Small vertically oriented tear extending through the superior and inferior articular surfaces of the free edge of the root of the posterior horn of the medial meniscus. Vertically oriented tear extending through the inferior articular surface of the peripheral third of the meniscal  triangle of the more medial aspect of the posterior horn of the medial meniscus. Tiny horizontal linear tear extending through the superior articular surface and the peripheral wall of the mid to anterior aspect of the body of the medial meniscus. 2. Full-thickness cartilage defect within the inferior aspect of the medial trochlea with subchondral cystic change. 3. Full-thickness cartilage loss within the mid transverse, posterior weight-bearing lateral femoral condyle extending into the posterior nonweightbearing lateral femoral condyle. Focal defect within the adjacent posterolateral tibial plateau cartilage. 4. At the distal aspect of the tibial tunnel for the ACL reconstruction, there is mild fluid bright signal bordering the medial cortex of the proximal tibial metaphysis. There is also mild-to-moderate marrow edema surrounding the distal tibial tunnel in this region. This may represent mild stress reaction.  PATIENT SURVEYS:  LEFS: 12/80    SENSATION: Not tested  EDEMA:  Moderate swelling about Lt knee     PALPATION: Not assessed   LOWER EXTREMITY ROM:  Active ROM Right eval Left eval Left AROM 05/17/24  Hip flexion     Hip extension     Hip abduction     Hip adduction     Hip internal rotation     Hip external rotation     Knee flexion  45 120  Knee extension  Lacking 5 Lacking 5  Ankle dorsiflexion     Ankle plantarflexion     Ankle inversion     Ankle eversion      (Blank rows = not tested)  LOWER EXTREMITY MMT:   MMT Right eval Left eval  Hip flexion    Hip extension    Hip abduction    Hip adduction    Hip internal rotation    Hip external rotation    Knee flexion  deferred  Knee extension  Deferred   Ankle dorsiflexion    Ankle plantarflexion    Ankle inversion    Ankle eversion     (Blank rows = not tested)    FUNCTIONAL TESTS:  Not assessed   GAIT: Distance walked: 10 ft  Assistive device utilized: Crutches Level of  assistance: Modified independence Comments: NWB LLE in knee immobilizer    [facility] Adult PT Treatment:                                                DATE: 05/17/24 Therapeutic Exercise: Supine  Quad set 3 sec x 10 x 2  SLR small range 3 sec x 10 x 2 HS stretch with strap 30 sec x 3  Heel slide with strap 10 sec x 10 Sidelying  Hip abduction leading with heel 3 sec x 10 x 2 Clam red TB 3 sec x 10 x 2  Prone  Hip extension 3 sec x 10 x 2  Knee flexion x 5 for ROM and gentle stretch through quad  Neuromuscular re-ed: Standing with avoiding hyperextension of  R knee  Therapeutic Activity: Standing  Weight shift side to side  Weight shift stagger step shift forward and back repeated with opposite foot forward  Gait: Gait without assistive device  Modalities: Vaso med compression; 34 deg; 15 min  Self Care: Avoid overdoing activities including standing and walking  Use ice several times per day                                                                                                                                [facility] Adult PT Treatment:                                                DATE: 05/10/24 Therapeutic Exercise: Demonstrated,performed, and issued initial HEP.   Gait training: With axillary crutches allowing for WBAT on the LLE Stair training with axillary crutches step to pattern   Self Care: Ice for pain/swelling Reviewed protocol Signs/symptoms of infection     PATIENT EDUCATION:  Education details: see treatment; POC Person educated: Patient Education method: Explanation, Demonstration, Tactile cues, Verbal cues, and Handouts Education comprehension: verbalized understanding, returned demonstration, verbal cues required, tactile cues required, and needs further education  HOME EXERCISE PROGRAM: Access Code: [email or web address] URL: [email or web address] Date: 05/17/2024 Prepared by: Yan Okray  Exercises - Long Sitting Quad Set  - 3 x daily - 7 x  weekly - 2 sets - 10 reps - 5 sec  hold - Long Sitting Calf Stretch with Strap  - 3 x daily - 7 x weekly - 3 sets - 30 sec  hold - Supine Heel Slide  - 3 x daily - 7 x weekly - 1 sets - 10 reps - Side to Side Weight Shift with Counter Support  - 3 x daily - 7 x weekly - 2 sets - 10 reps - Small Range Straight Leg Raise  - 2 x daily - 7 x weekly - 1 sets - 10 reps - 5 sec  hold - Sidelying Hip Abduction  - 1 x daily - 7 x weekly - 3 sets - 10 reps - 3-5 sec  hold - Prone Hip Extension  - 2 x daily - 7 x weekly - 1 sets - 10 reps - 3 sec  hold  ASSESSMENT:  CLINICAL IMPRESSION: Patient returns with donjoy brace L knee ambulating without assistive device. Note continued edema. Good gains in AAROM L knee flexion. Reviewed and progressed exercises. Tolerated exercise without difficulty.   EVAL: Patient is a 48 y.o. female who was seen today for physical therapy evaluation and treatment for s/p Lt knee arthroscopy with medial meniscectomy and revision ACL reconstruction with allograft on 05/07/24. She demonstrates ROM, strength, gait and balance deficits that are consistent with her recent post-operative status. She will benefit from skilled PT to address the above stated deficits  in order to return to optimal function.   OBJECTIVE IMPAIRMENTS: Abnormal gait, decreased activity tolerance, decreased balance, decreased endurance, decreased mobility, difficulty walking, decreased ROM, decreased strength, increased edema, impaired flexibility, improper body mechanics, and pain.    GOALS: Goals reviewed with patient? Yes  SHORT TERM GOALS: Target date: 06/21/2024   Patient will be independent and compliant with initial HEP.   Baseline: issued at eval Goal status: INITIAL  2.  Patient will be able to perform LLE SLR x 10 without quad lag indicative of improved knee stability.  Baseline: unable Goal status: INITIAL  3.  Patient will demonstrate at least 100 degrees of Lt knee flexion AROM to  improve ability to complete sit to stand transfers.  Baseline: see above Goal status: INITIAL  4.  Patient will demonstrate full Lt knee extension AROM to improve gait mechanics.  Baseline: see above Goal status: INITIAL  5.  Patient will ambulate community distances without AD without Lt knee pain.  Baseline: WBAT LLE Goal status: INITIAL    LONG TERM GOALS: Target date: 08/04/24  Patient will demonstrate 5/5 Lt knee strength to improve stability with curb/stair negotiation.  Baseline: not assessed.  Goal status: INITIAL  2.  Patient will demonstrate normalized squat mechanics without pain.  Baseline: unable Goal status: INITIAL  3.  Patient will score >/= 50/80 on the LEFS (MCID is 9) to signify clinically meaningful improvement in functional abilities.   Baseline: see above  Goal status: INITIAL  4.  Patient will demonstrate at least 120 degrees of Lt knee flexion AROM to improve ability to complete bending activity.  Baseline: see above Goal status: INITIAL  5.  Patient will be independent with advanced home program to progress strength phase of rehab independently.  Baseline: initial HEP issued  Goal status: INITIAL   PLAN:  PT FREQUENCY: 2x/week  PT DURATION: 12 weeks  PLANNED INTERVENTIONS: 97164- PT Re-evaluation, 97750- Physical Performance Testing, 97110-Therapeutic exercises, 97530- Therapeutic activity, [id number]- Neuromuscular re-education, 97535- Self Care, 02859- Manual therapy, [id number]- Gait training, [id number]- Electrical stimulation (unattended), [id number]- Electrical stimulation (manual), 97016- Vasopneumatic device, 20560 (1-2 muscles), 20561 (3+ muscles)- Dry Needling, Cryotherapy, and Moist heat  PLAN FOR NEXT SESSION: progress per protocol; focus on progressing to full weightbearing and quad activation. Game ready    Joliyah Lippens P. Ina PT, MPH 05/17/24 1:20 PM

## 2024-05-28 ENCOUNTER — Ambulatory Visit: Attending: Orthopaedic Surgery

## 2024-05-28 DIAGNOSIS — M25562 Pain in left knee: Secondary | ICD-10-CM | POA: Insufficient documentation

## 2024-05-28 DIAGNOSIS — R2689 Other abnormalities of gait and mobility: Secondary | ICD-10-CM | POA: Diagnosis present

## 2024-05-28 DIAGNOSIS — M6281 Muscle weakness (generalized): Secondary | ICD-10-CM | POA: Insufficient documentation

## 2024-05-28 DIAGNOSIS — R600 Localized edema: Secondary | ICD-10-CM | POA: Insufficient documentation

## 2024-05-28 NOTE — Therapy (Signed)
 OUTPATIENT PHYSICAL THERAPY LOWER EXTREMITY TREATMENT   Patient Name: Whitney Turner MRN: [id number] DOB:[birth year], 48 y.o., female Today's Date: 05/28/2024  END OF SESSION:  PT End of Session - 05/28/24 1316     Visit Number 3    Number of Visits 25    Date for PT Re-Evaluation 08/04/24    Authorization Type [employer]    PT Start Time 1316    PT Stop Time 1406    PT Time Calculation (min) 50 min    Activity Tolerance Patient tolerated treatment well           Past Medical History:  Diagnosis Date   Allergy    Heart murmur    Herpes    Past Surgical History:  Procedure Laterality Date   HERNIA REPAIR  1982   KNEE SURGERY  08/2011   TONSILECTOMY/ADENOIDECTOMY WITH MYRINGOTOMY  2000   Patient Active Problem List   Diagnosis Date Noted   Abnormal weight gain 03/05/2024   Chronic left hip pain 02/27/2024   Tibialis posterior tendinitis, left 03/10/2023   Mouth ulcer 03/08/2023   Sore throat 03/08/2023   Poor concentration 12/30/2020   Hyperactive behavior 12/30/2020   Chronic left shoulder pain 11/17/2020   Elevated LDL cholesterol level 12/24/2019   Right lower quadrant pain 12/24/2019   Right upper quadrant pain 12/24/2019   Effusion of right knee 06/25/2019   Hemorrhoids 12/04/2018   CIN I (cervical intraepithelial neoplasia I) 04/03/2018   Lateral epicondylitis of left elbow 02/15/2018   Left hand pain 02/23/2016   History of repair of ACL of Left knee 01/26/2016   Plantar fasciitis, bilateral 12/29/2015   Genital herpes 12/01/2012   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 Still feels tight. Still gets some swelling as the day goes on. She has been without crutches. She has upcoming trip on 06/07/24.   PERTINENT HISTORY: left knee arthroscopy with medial meniscectomy and revision ACL reconstruction with allograft 05/07/24 Previous LT ACLR  PAIN:  Are you having pain? Yes: NPRS scale: 3 Pain location: lt knee Pain description: tender,sore Aggravating factors: unknown, putting weight on it Relieving factors: rest  PRECAUTIONS: Other: see protocol    WEIGHT BEARING RESTRICTIONS: Yes LLE WBAT in knee immobilizer   FALLS:  Has patient fallen in last 6 months? No  LIVING ENVIRONMENT: Lives with: lives with their family Lives in: House/apartment Stairs: Yes: Internal: flight steps; on right going up and External: 3 steps; none Has following equipment at home: Crutches, knee immobilizer  OCCUPATION: softball umpire    PATIENT GOALS: full range of motion and be able to jog.   NEXT MD VISIT: 05/15/24  OBJECTIVE:  Note: Objective measures were completed at Evaluation unless otherwise noted.  DIAGNOSTIC FINDINGS: IMPRESSION: 1. Small vertically oriented tear extending through the superior and inferior articular surfaces of the free edge of the root of the posterior horn of the medial meniscus. Vertically oriented tear extending through the inferior articular surface of the peripheral third of the meniscal  triangle of the more medial aspect of the posterior horn of the medial meniscus. Tiny horizontal linear tear extending through the superior articular surface and the peripheral wall of the mid to anterior aspect of the body of the medial meniscus. 2. Full-thickness cartilage defect within the inferior aspect of the medial trochlea with subchondral cystic change. 3. Full-thickness cartilage loss  within the mid transverse, posterior weight-bearing lateral femoral condyle extending into the posterior nonweightbearing lateral femoral condyle. Focal defect within the adjacent posterolateral tibial plateau cartilage. 4. At the distal aspect of the tibial tunnel for the ACL reconstruction, there is mild fluid bright signal bordering the medial cortex of the proximal tibial metaphysis. There is also mild-to-moderate marrow edema surrounding the distal tibial tunnel in this region. This may represent mild stress reaction.  PATIENT SURVEYS:  LEFS: 12/80    SENSATION: Not tested  EDEMA:  Moderate swelling about Lt knee     PALPATION: Not assessed   LOWER EXTREMITY ROM:  Active ROM Right eval Left eval Left AROM 05/17/24 05/28/24 Left   Hip flexion      Hip extension      Hip abduction      Hip adduction      Hip internal rotation      Hip external rotation      Knee flexion  45 120 120  Knee extension  Lacking 5 Lacking 5 Lacking 3  Ankle dorsiflexion      Ankle plantarflexion      Ankle inversion      Ankle eversion       (Blank rows = not tested)  LOWER EXTREMITY MMT:   MMT Right eval Left eval  Hip flexion    Hip extension    Hip abduction    Hip adduction    Hip internal rotation    Hip external rotation    Knee flexion  deferred  Knee extension  Deferred   Ankle dorsiflexion    Ankle plantarflexion    Ankle inversion    Ankle eversion     (Blank rows = not tested)    FUNCTIONAL TESTS:  Not assessed   GAIT: Distance walked: 10 ft  Assistive device utilized: Crutches Level of assistance: Modified independence Comments: NWB LLE in knee immobilizer   [facility] Adult PT Treatment:                                                DATE: 05/28/24 Therapeutic Exercise: Recumbent bike no resistance x 5 minutes  HS stretch 2 x 30 sec  IT band stretch 2 x 30 sec Neuromuscular re-ed: SLR 2 x 10  TKE 2 x 10 blue band  Sidelying hip abduction 2 x 10   Therapeutic Activity: Mini wall squat 2 x 10  Modalities: Game ready (VASO) Lt knee x 10 minutes medium compression  Self Care: Reviewed protocol   [facility] Adult PT Treatment:                                                DATE: 05/17/24 Therapeutic Exercise: Supine  Quad set 3 sec x 10 x 2  SLR small range 3 sec x 10 x 2 HS stretch with strap 30 sec x 3  Heel slide with strap 10 sec x 10 Sidelying  Hip abduction leading with heel 3 sec x 10 x 2 Clam red TB 3 sec x 10 x 2  Prone  Hip extension 3 sec x 10 x 2  Knee flexion x 5 for ROM and gentle stretch through quad  Neuromuscular re-ed: Standing with avoiding hyperextension of R knee  Therapeutic Activity: Standing  Weight shift side to side  Weight shift stagger step shift forward and back repeated with opposite foot forward  Gait: Gait without assistive device  Modalities: Vaso med compression; 34 deg; 15 min  Self Care: Avoid overdoing activities including standing and walking  Use ice several times per day                                                                                                                                [facility] Adult PT Treatment:                                                DATE: 05/10/24 Therapeutic Exercise: Demonstrated,performed, and issued initial HEP.   Gait training: With axillary crutches allowing for WBAT on the LLE Stair training with axillary crutches step to pattern   Self Care: Ice for pain/swelling Reviewed protocol Signs/symptoms of infection     PATIENT EDUCATION:  Education details: HEP update Person educated: Patient Education method: Explanation, Demonstration, Tactile cues, Verbal cues, and Handouts Education comprehension: verbalized understanding, returned demonstration, verbal cues required, tactile cues required, and needs further education  HOME EXERCISE PROGRAM: Access Code: [email or web address] URL: [email or web address] Date: 05/28/2024 Prepared by:  Lucie Meeter  Exercises - Long Sitting Calf Stretch with Strap  - 3 x daily - 7 x weekly - 3 sets - 30 sec  hold - Supine Heel Slide  - 3 x daily - 7 x weekly - 1 sets - 10 reps - Sidelying Hip Abduction  - 1 x daily - 7 x weekly - 3 sets - 10 reps - 3-5 sec  hold - Prone Hip Extension  - 2 x daily - 7 x weekly - 1 sets - 10 reps - 3 sec  hold - Standing Terminal Knee Extension with Resistance  - 1 x daily - 7 x weekly - 2 sets - 10 reps - Active Straight Leg Raise with Quad Set  - 1 x daily - 7 x weekly - 2 sets - 10 reps - Supine Hamstring Stretch with Strap  - 1 x daily - 7 x weekly - 3 sets - 30 sec  hold  ASSESSMENT:  CLINICAL IMPRESSION: Patient returns to PT after work trip with mild Lt knee pain. She is able to perform SLR without quad lag. Nearing full knee extension AROM. She is able to complete full forward revolution on the recumbent bike without  knee pain.  Introduced CKC strengthening within protocol parameters with patient able to complete mini wall squat with good form without onset of knee pain.   EVAL: Patient is a 48 y.o. female who was seen today for physical therapy evaluation and treatment for s/p Lt knee arthroscopy with medial meniscectomy and revision ACL reconstruction with allograft on 05/07/24. She demonstrates ROM, strength, gait and balance deficits that are consistent with her recent post-operative status. She will benefit from skilled PT to address the above stated deficits in order to return to optimal function.   OBJECTIVE IMPAIRMENTS: Abnormal gait, decreased activity tolerance, decreased balance, decreased endurance, decreased mobility, difficulty walking, decreased ROM, decreased strength, increased edema, impaired flexibility, improper body mechanics, and pain.    GOALS: Goals reviewed with patient? Yes  SHORT TERM GOALS: Target date: 06/21/2024   Patient will be independent and compliant with initial HEP.   Baseline: issued at eval Goal status:  INITIAL  2.  Patient will be able to perform LLE SLR x 10 without quad lag indicative of improved knee stability.  Baseline: unable Goal status: INITIAL  3.  Patient will demonstrate at least 100 degrees of Lt knee flexion AROM to improve ability to complete sit to stand transfers.  Baseline: see above Goal status: INITIAL  4.  Patient will demonstrate full Lt knee extension AROM to improve gait mechanics.  Baseline: see above Goal status: INITIAL  5.  Patient will ambulate community distances without AD without Lt knee pain.  Baseline: WBAT LLE Goal status: INITIAL    LONG TERM GOALS: Target date: 08/04/24  Patient will demonstrate 5/5 Lt knee strength to improve stability with curb/stair negotiation.  Baseline: not assessed.  Goal status: INITIAL  2.  Patient will demonstrate normalized squat mechanics without pain.  Baseline: unable Goal status: INITIAL  3.  Patient will score >/= 50/80 on the LEFS (MCID is 9) to signify clinically meaningful improvement in functional abilities.   Baseline: see above  Goal status: INITIAL  4.  Patient will demonstrate at least 120 degrees of Lt knee flexion AROM to improve ability to complete bending activity.  Baseline: see above Goal status: INITIAL  5.  Patient will be independent with advanced home program to progress strength phase of rehab independently.  Baseline: initial HEP issued  Goal status: INITIAL   PLAN:  PT FREQUENCY: 2x/week  PT DURATION: 12 weeks  PLANNED INTERVENTIONS: 97164- PT Re-evaluation, 97750- Physical Performance Testing, 97110-Therapeutic exercises, 97530- Therapeutic activity, [id number]- Neuromuscular re-education, 97535- Self Care, [id number]- Manual therapy, [id number]- Gait training, [phone]- Electrical stimulation (unattended), [id number]- Electrical stimulation (manual), 97016- Vasopneumatic device, 20560 (1-2 muscles), 20561 (3+ muscles)- Dry Needling, Cryotherapy, and Moist heat  PLAN FOR NEXT SESSION: progress per  protocol; game ready   Thamar Holik, PT, DPT, ATC 05/28/24 2:00 PM

## 2024-05-31 ENCOUNTER — Ambulatory Visit

## 2024-05-31 DIAGNOSIS — M6281 Muscle weakness (generalized): Secondary | ICD-10-CM

## 2024-05-31 DIAGNOSIS — M25562 Pain in left knee: Secondary | ICD-10-CM

## 2024-05-31 DIAGNOSIS — R600 Localized edema: Secondary | ICD-10-CM

## 2024-05-31 DIAGNOSIS — R2689 Other abnormalities of gait and mobility: Secondary | ICD-10-CM

## 2024-05-31 NOTE — Therapy (Signed)
 OUTPATIENT PHYSICAL THERAPY LOWER EXTREMITY TREATMENT   Patient Name: Whitney Turner MRN: [id number] DOB:[birth year], 48 y.o., female Today's Date: 05/31/2024  END OF SESSION:  PT End of Session - 05/31/24 1018     Visit Number 4    Number of Visits 25    Date for PT Re-Evaluation 08/04/24    Authorization Type U[employer]    PT Start Time 1018    PT Stop Time 1111    PT Time Calculation (min) 53 min    Activity Tolerance Patient tolerated treatment well            Past Medical History:  Diagnosis Date   Allergy    Heart murmur    Herpes    Past Surgical History:  Procedure Laterality Date   HERNIA REPAIR  1982   KNEE SURGERY  08/2011   TONSILECTOMY/ADENOIDECTOMY WITH MYRINGOTOMY  2000   Patient Active Problem List   Diagnosis Date Noted   Abnormal weight gain 03/05/2024   Chronic left hip pain 02/27/2024   Tibialis posterior tendinitis, left 03/10/2023   Mouth ulcer 03/08/2023   Sore throat 03/08/2023   Poor concentration 12/30/2020   Hyperactive behavior 12/30/2020   Chronic left shoulder pain 11/17/2020   Elevated LDL cholesterol level 12/24/2019   Right lower quadrant pain 12/24/2019   Right upper quadrant pain 12/24/2019   Effusion of right knee 06/25/2019   Hemorrhoids 12/04/2018   CIN I (cervical intraepithelial neoplasia I) 04/03/2018   Lateral epicondylitis of left elbow 02/15/2018   Left hand pain 02/23/2016   History of repair of ACL of Left knee 01/26/2016   Plantar fasciitis, bilateral 12/29/2015   Genital herpes 12/01/2012   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 She reports swelling is better today.   PERTINENT HISTORY: left knee arthroscopy with medial meniscectomy and revision ACL reconstruction with allograft 05/07/24 Previous LT ACLR  PAIN:  Are you having pain? Yes: NPRS scale: 3 Pain location: Lt knee Pain description: tender,sore Aggravating factors: unknown, putting weight on it Relieving factors: rest  PRECAUTIONS: Other: see protocol    WEIGHT BEARING RESTRICTIONS: Yes LLE WBAT in knee immobilizer   FALLS:  Has patient fallen in last 6 months? No  LIVING ENVIRONMENT: Lives with: lives with their family Lives in: House/apartment Stairs: Yes: Internal: flight steps; on right going up and External: 3 steps; none Has following equipment at home: Crutches, knee immobilizer  OCCUPATION: softball umpire    PATIENT GOALS: full range of motion and be able to jog.   NEXT MD VISIT: 05/15/24  OBJECTIVE:  Note: Objective measures were completed at Evaluation unless otherwise noted.  DIAGNOSTIC FINDINGS: IMPRESSION: 1. Small vertically oriented tear extending through the superior and inferior articular surfaces of the free edge of the root of the posterior horn of the medial meniscus. Vertically oriented tear extending through the inferior articular surface of the peripheral third of the meniscal triangle of the more medial aspect of the posterior horn of the  medial meniscus. Tiny horizontal linear tear extending through the superior articular surface and the peripheral wall of the mid to anterior aspect of the body of the medial meniscus. 2. Full-thickness cartilage defect within the inferior aspect of the medial trochlea with subchondral cystic change. 3. Full-thickness cartilage loss within the mid transverse, posterior weight-bearing lateral femoral condyle extending  into the posterior nonweightbearing lateral femoral condyle. Focal defect within the adjacent posterolateral tibial plateau cartilage. 4. At the distal aspect of the tibial tunnel for the ACL reconstruction, there is mild fluid bright signal bordering the medial cortex of the proximal tibial metaphysis. There is also mild-to-moderate marrow edema surrounding the distal tibial tunnel in this region. This may represent mild stress reaction.  PATIENT SURVEYS:  LEFS: 12/80    SENSATION: Not tested  EDEMA:  Moderate swelling about Lt knee     PALPATION: Not assessed   LOWER EXTREMITY ROM:  Active ROM Right eval Left eval Left AROM 05/17/24 05/28/24 Left   Hip flexion      Hip extension      Hip abduction      Hip adduction      Hip internal rotation      Hip external rotation      Knee flexion  45 120 120  Knee extension  Lacking 5 Lacking 5 Lacking 3  Ankle dorsiflexion      Ankle plantarflexion      Ankle inversion      Ankle eversion       (Blank rows = not tested)  LOWER EXTREMITY MMT:   MMT Right eval Left eval  Hip flexion    Hip extension    Hip abduction    Hip adduction    Hip internal rotation    Hip external rotation    Knee flexion  deferred  Knee extension  Deferred   Ankle dorsiflexion    Ankle plantarflexion    Ankle inversion    Ankle eversion     (Blank rows = not tested)    FUNCTIONAL TESTS:  Not assessed   GAIT: Distance walked: 10 ft  Assistive device utilized: Crutches Level of assistance: Modified independence Comments: NWB LLE in knee immobilizer  [facility] Adult PT Treatment:                                                DATE: 05/31/24 Therapeutic Exercise: Recumbent bike no resistance x 5 minutes Standing SL calf raise 2 x 10  HEP review/update  Manual Therapy: IASTM LT quadriceps, IT band  Neuromuscular re-ed: SLR 2 x 10  Sidelying hip abduction 2 x 10 @ 2 lbs  Therapeutic Activity: Mini wall squat 2 x 10  Step up knee  driver 6 inch 2 x 10  Modalities: Game ready (VASO) Lt knee x 10 minutes medium compression  Self Care: Protocol review   [facility] Adult PT Treatment:                                                DATE: 05/28/24 Therapeutic Exercise: Recumbent bike no resistance x 5 minutes  HS stretch 2 x 30 sec  IT band stretch 2 x 30 sec Neuromuscular re-ed: SLR 2 x 10  TKE 2  x 10 blue band  Sidelying hip abduction 2 x 10  Therapeutic Activity: Mini wall squat 2 x 10  Modalities: Game ready (VASO) Lt knee x 10 minutes medium compression  Self Care: Reviewed protocol   [facility] Adult PT Treatment:                                                DATE: 05/17/24 Therapeutic Exercise: Supine  Quad set 3 sec x 10 x 2  SLR small range 3 sec x 10 x 2 HS stretch with strap 30 sec x 3  Heel slide with strap 10 sec x 10 Sidelying  Hip abduction leading with heel 3 sec x 10 x 2 Clam red TB 3 sec x 10 x 2  Prone  Hip extension 3 sec x 10 x 2  Knee flexion x 5 for ROM and gentle stretch through quad  Neuromuscular re-ed: Standing with avoiding hyperextension of R knee  Therapeutic Activity: Standing  Weight shift side to side  Weight shift stagger step shift forward and back repeated with opposite foot forward  Gait: Gait without assistive device  Modalities: Vaso med compression; 34 deg; 15 min  Self Care: Avoid overdoing activities including standing and walking  Use ice several times per day                            PATIENT EDUCATION:  Education details: HEP update Person educated: Patient Education method: Explanation, Demonstration, Tactile cues, Verbal cues, and Handouts Education comprehension: verbalized understanding, returned demonstration, verbal cues required, tactile cues required, and needs further education  HOME EXERCISE PROGRAM: Access Code: [email or web address] URL: [email or web address] Date: 05/31/2024 Prepared by: Lucie Meeter  Exercises - Long Sitting Calf Stretch  with Strap  - 3 x daily - 7 x weekly - 3 sets - 30 sec  hold - Supine Heel Slide  - 3 x daily - 7 x weekly - 1 sets - 10 reps - Sidelying Hip Abduction  - 1 x daily - 7 x weekly - 3 sets - 10 reps - 3-5 sec  hold - Prone Hip Extension  - 2 x daily - 7 x weekly - 1 sets - 10 reps - 3 sec  hold - Standing Terminal Knee Extension with Resistance  - 1 x daily - 7 x weekly - 2 sets - 10 reps - Active Straight Leg Raise with Quad Set  - 1 x daily - 7 x weekly - 2 sets - 10 reps - Supine Hamstring Stretch with Strap  - 1 x daily - 7 x weekly - 3 sets - 30 sec  hold - Standing Single Leg Heel Raise  - 1 x daily - 7 x weekly - 2 sets - 10 reps  ASSESSMENT:  CLINICAL IMPRESSION: Patient arrives with mild Lt knee soreness. She initially reported pressure about anterior knee with mini wall squats that was improved following IASTM to quad and IT band. Moderate sway noted with step up knee driver, but no LOB and no reports of knee pain. We reviewed protocol reminding patient of current precautions with patient verbalizing understanding.   EVAL: Patient is a 48 y.o. female who was seen today for physical therapy evaluation and treatment for s/p Lt knee arthroscopy with medial meniscectomy and revision ACL reconstruction with allograft on 05/07/24.  She demonstrates ROM, strength, gait and balance deficits that are consistent with her recent post-operative status. She will benefit from skilled PT to address the above stated deficits in order to return to optimal function.   OBJECTIVE IMPAIRMENTS: Abnormal gait, decreased activity tolerance, decreased balance, decreased endurance, decreased mobility, difficulty walking, decreased ROM, decreased strength, increased edema, impaired flexibility, improper body mechanics, and pain.    GOALS: Goals reviewed with patient? Yes  SHORT TERM GOALS: Target date: 06/21/2024   Patient will be independent and compliant with initial HEP.   Baseline: issued at eval Goal  status: MET  2.  Patient will be able to perform LLE SLR x 10 without quad lag indicative of improved knee stability.  Baseline: unable 05/31/24: no quad lag with SLR Goal status: MET  3.  Patient will demonstrate at least 100 degrees of Lt knee flexion AROM to improve ability to complete sit to stand transfers.  Baseline: see above Goal status: MET  4.  Patient will demonstrate full Lt knee extension AROM to improve gait mechanics.  Baseline: see above Goal status: INITIAL  5.  Patient will ambulate community distances without AD without Lt knee pain.  Baseline: WBAT LLE Goal status: INITIAL    LONG TERM GOALS: Target date: 08/04/24  Patient will demonstrate 5/5 Lt knee strength to improve stability with curb/stair negotiation.  Baseline: not assessed.  Goal status: INITIAL  2.  Patient will demonstrate normalized squat mechanics without pain.  Baseline: unable Goal status: INITIAL  3.  Patient will score >/= 50/80 on the LEFS (MCID is 9) to signify clinically meaningful improvement in functional abilities.   Baseline: see above  Goal status: INITIAL  4.  Patient will demonstrate at least 120 degrees of Lt knee flexion AROM to improve ability to complete bending activity.  Baseline: see above Goal status: INITIAL  5.  Patient will be independent with advanced home program to progress strength phase of rehab independently.  Baseline: initial HEP issued  Goal status: INITIAL   PLAN:  PT FREQUENCY: 2x/week  PT DURATION: 12 weeks  PLANNED INTERVENTIONS: 97164- PT Re-evaluation, 97750- Physical Performance Testing, 97110-Therapeutic exercises, 97530- Therapeutic activity, [id number]- Neuromuscular re-education, 97535- Self Care, 02859- Manual therapy, [id number]- Gait training, [phone]- Electrical stimulation (unattended), [id number]- Electrical stimulation (manual), 97016- Vasopneumatic device, 20560 (1-2 muscles), 20561 (3+ muscles)- Dry Needling, Cryotherapy, and Moist heat  PLAN FOR  NEXT SESSION: progress per protocol; game ready   Mellisa Arshad, PT, DPT, ATC 05/31/24 11:06 AM

## 2024-06-05 ENCOUNTER — Encounter

## 2024-06-06 ENCOUNTER — Ambulatory Visit

## 2024-06-06 DIAGNOSIS — R2689 Other abnormalities of gait and mobility: Secondary | ICD-10-CM

## 2024-06-06 DIAGNOSIS — M6281 Muscle weakness (generalized): Secondary | ICD-10-CM

## 2024-06-06 DIAGNOSIS — M25562 Pain in left knee: Secondary | ICD-10-CM

## 2024-06-06 DIAGNOSIS — R600 Localized edema: Secondary | ICD-10-CM

## 2024-06-06 NOTE — Therapy (Signed)
 OUTPATIENT PHYSICAL THERAPY LOWER EXTREMITY TREATMENT   Patient Name: Whitney Turner MRN: [id number] DOB:1976/09/22, 48 y.o., female Today's Date: 06/06/2024  END OF SESSION:  PT End of Session - 06/06/24 1016     Visit Number 5    Number of Visits 25    Date for PT Re-Evaluation 08/04/24    Authorization Type [employer]    PT Start Time 1016    PT Stop Time 1100    PT Time Calculation (min) 44 min    Activity Tolerance Patient tolerated treatment well    Behavior During Therapy WFL for tasks assessed/performed             Past Medical History:  Diagnosis Date   Allergy    Heart murmur    Herpes    Past Surgical History:  Procedure Laterality Date   HERNIA REPAIR  1982   KNEE SURGERY  08/2011   TONSILECTOMY/ADENOIDECTOMY WITH MYRINGOTOMY  2000   Patient Active Problem List   Diagnosis Date Noted   Abnormal weight gain 03/05/2024   Chronic left hip pain 02/27/2024   Tibialis posterior tendinitis, left 03/10/2023   Mouth ulcer 03/08/2023   Sore throat 03/08/2023   Poor concentration 12/30/2020   Hyperactive behavior 12/30/2020   Chronic left shoulder pain 11/17/2020   Elevated LDL cholesterol level 12/24/2019   Right lower quadrant pain 12/24/2019   Right upper quadrant pain 12/24/2019   Effusion of right knee 06/25/2019   Hemorrhoids 12/04/2018   CIN I (cervical intraepithelial neoplasia I) 04/03/2018   Lateral epicondylitis of left elbow 02/15/2018   Left hand pain 02/23/2016   History of repair of ACL of Left knee 01/26/2016   Plantar fasciitis, bilateral 12/29/2015   Genital herpes 12/01/2012   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/2012   Allergic rhinitis 12/01/2012   Seasonal allergies 12/01/2012    PCP: Antoniette Vermell LITTIE DEVONNA   REFERRING PROVIDER: Bonner Cristy DASEN, MD  REFERRING DIAG: left knee arthroscopy with medial meniscectomy and revision ACL reconstruction with allograft 6/16  THERAPY DIAG:  Acute pain of left knee  Muscle weakness (generalized)  Other  abnormalities of gait and mobility  Localized edema  Rationale for Evaluation and Treatment: Rehabilitation  ONSET DATE: 05/07/24  SUBJECTIVE:   SUBJECTIVE STATEMENT: Patient had f/u with Dr. Cristy who is pleased with progress and will return again in October. No pain right now. Leaves for [place] vacation tomorrow.   PERTINENT HISTORY: left knee arthroscopy with medial meniscectomy and revision ACL reconstruction with allograft 05/07/24 Previous LT ACLR  PAIN:  Are you having pain?No  PRECAUTIONS: Other: see protocol    WEIGHT BEARING RESTRICTIONS: Yes LLE WBAT in knee immobilizer   FALLS:  Has patient fallen in last 6 months? No  LIVING ENVIRONMENT: Lives with: lives with their family Lives in: House/apartment Stairs: Yes: Internal: flight steps; on right going up and External: 3 steps; none Has following equipment at home: Crutches, knee immobilizer  OCCUPATION: softball umpire    PATIENT GOALS: full range of motion and be able to jog.   NEXT MD VISIT: October 2025   OBJECTIVE:  Note: Objective measures were completed at Evaluation unless otherwise noted.  DIAGNOSTIC FINDINGS: IMPRESSION: 1. Small vertically oriented tear extending through the superior and inferior articular surfaces of the free edge of the root of the posterior horn of the medial meniscus. Vertically oriented tear extending through the inferior articular surface of the peripheral third of the meniscal triangle of the more medial aspect of the posterior horn of the  medial meniscus. Tiny horizontal linear tear extending through the superior articular surface and the peripheral wall of the mid to anterior aspect of the body of the medial meniscus. 2. Full-thickness cartilage defect within the inferior aspect of the medial trochlea with subchondral cystic change. 3. Full-thickness cartilage loss within the mid transverse, posterior weight-bearing lateral femoral condyle extending into  the posterior nonweightbearing lateral femoral condyle. Focal defect within the adjacent posterolateral tibial plateau cartilage. 4. At the distal aspect of the tibial tunnel for the ACL reconstruction, there is mild fluid bright signal bordering the medial cortex of the proximal tibial metaphysis. There is also mild-to-moderate marrow edema surrounding the distal tibial tunnel in this region. This may represent mild stress reaction.  PATIENT SURVEYS:  LEFS: 12/80    SENSATION: Not tested  EDEMA:  Moderate swelling about Lt knee     PALPATION: Not assessed   LOWER EXTREMITY ROM:  Active ROM Right eval Left eval Left AROM 05/17/24 05/28/24 Left   Hip flexion      Hip extension      Hip abduction      Hip adduction      Hip internal rotation      Hip external rotation      Knee flexion  45 120 120  Knee extension  Lacking 5 Lacking 5 Lacking 3  Ankle dorsiflexion      Ankle plantarflexion      Ankle inversion      Ankle eversion       (Blank rows = not tested)  LOWER EXTREMITY MMT:   MMT Right eval Left eval  Hip flexion    Hip extension    Hip abduction    Hip adduction    Hip internal rotation    Hip external rotation    Knee flexion  deferred  Knee extension  Deferred   Ankle dorsiflexion    Ankle plantarflexion    Ankle inversion    Ankle eversion     (Blank rows = not tested)    FUNCTIONAL TESTS:  Not assessed   GAIT: Distance walked: 10 ft  Assistive device utilized: Crutches Level of assistance: Modified independence Comments: NWB LLE in knee immobilizer  [facility] Adult PT Treatment:                                                DATE: 06/06/24  Manual Therapy: IASTM LT quadriceps, IT band  Neuromuscular re-ed: 3 way hip on airex x 10 each  Hip bridge on physioball 2 x 10  Therapeutic Activity: Mini wall squats 2 x 10  Lateral step down 2 x 10; 4 inch step  Mini squat with BUE support 2 x 10     [facility] Adult PT Treatment:                                                 DATE: 05/31/24 Therapeutic Exercise: Recumbent bike no resistance x 5 minutes Standing SL calf raise 2 x 10  HEP review/update  Manual Therapy: IASTM LT quadriceps, IT band  Neuromuscular re-ed: SLR 2 x 10  Sidelying hip abduction 2 x 10 @ 2 lbs  Therapeutic Activity: Mini wall squat 2 x 10  Step up  knee driver 6 inch 2 x 10  Modalities: Game ready (VASO) Lt knee x 10 minutes medium compression  Self Care: Protocol review   [facility] Adult PT Treatment:                                                DATE: 05/28/24 Therapeutic Exercise: Recumbent bike no resistance x 5 minutes  HS stretch 2 x 30 sec  IT band stretch 2 x 30 sec Neuromuscular re-ed: SLR 2 x 10  TKE 2 x 10 blue band  Sidelying hip abduction 2 x 10  Therapeutic Activity: Mini wall squat 2 x 10  Modalities: Game ready (VASO) Lt knee x 10 minutes medium compression  Self Care: Reviewed protocol   PATIENT EDUCATION:  Education details: HEP review Person educated: Patient Education method: Explanation Education comprehension: verbalized understanding  HOME EXERCISE PROGRAM: Access Code: [email or web address] URL: [email or web address] Date: 05/31/2024 Prepared by: Lucie Meeter  Exercises - Long Sitting Calf Stretch with Strap  - 3 x daily - 7 x weekly - 3 sets - 30 sec  hold - Supine Heel Slide  - 3 x daily - 7 x weekly - 1 sets - 10 reps - Sidelying Hip Abduction  - 1 x daily - 7 x weekly - 3 sets - 10 reps - 3-5 sec  hold - Prone Hip Extension  - 2 x daily - 7 x weekly - 1 sets - 10 reps - 3 sec  hold - Standing Terminal Knee Extension with Resistance  - 1 x daily - 7 x weekly - 2 sets - 10 reps - Active Straight Leg Raise with Quad Set  - 1 x daily - 7 x weekly - 2 sets - 10 reps - Supine Hamstring Stretch with Strap  - 1 x daily - 7 x weekly - 3 sets - 30 sec  hold - Standing Single Leg Heel Raise  - 1 x daily - 7 x weekly - 2 sets - 10 reps  ASSESSMENT:  CLINICAL  IMPRESSION: Patient arrives without reports of knee pain. Progressed CKC strengthening within protocol parameters with good tolerance. Challenged with lateral step down with initial difficulty controlling lowering, but with continued practice and cues she has better control. Introduced balance activity on unstable surface with minimal sway noted. No reports of knee pain reported throughout session.   EVAL: Patient is a 48 y.o. female who was seen today for physical therapy evaluation and treatment for s/p Lt knee arthroscopy with medial meniscectomy and revision ACL reconstruction with allograft on 05/07/24. She demonstrates ROM, strength, gait and balance deficits that are consistent with her recent post-operative status. She will benefit from skilled PT to address the above stated deficits in order to return to optimal function.   OBJECTIVE IMPAIRMENTS: Abnormal gait, decreased activity tolerance, decreased balance, decreased endurance, decreased mobility, difficulty walking, decreased ROM, decreased strength, increased edema, impaired flexibility, improper body mechanics, and pain.    GOALS: Goals reviewed with patient? Yes  SHORT TERM GOALS: Target date: 06/21/2024   Patient will be independent and compliant with initial HEP.   Baseline: issued at eval Goal status: MET  2.  Patient will be able to perform LLE SLR x 10 without quad lag indicative of improved knee stability.  Baseline: unable 05/31/24: no quad lag with SLR Goal status: MET  3.  Patient will demonstrate  at least 100 degrees of Lt knee flexion AROM to improve ability to complete sit to stand transfers.  Baseline: see above Goal status: MET  4.  Patient will demonstrate full Lt knee extension AROM to improve gait mechanics.  Baseline: see above Goal status: INITIAL  5.  Patient will ambulate community distances without AD without Lt knee pain.  Baseline: WBAT LLE Goal status: INITIAL    LONG TERM GOALS: Target date:  08/04/24  Patient will demonstrate 5/5 Lt knee strength to improve stability with curb/stair negotiation.  Baseline: not assessed.  Goal status: INITIAL  2.  Patient will demonstrate normalized squat mechanics without pain.  Baseline: unable Goal status: INITIAL  3.  Patient will score >/= 50/80 on the LEFS (MCID is 9) to signify clinically meaningful improvement in functional abilities.   Baseline: see above  Goal status: INITIAL  4.  Patient will demonstrate at least 120 degrees of Lt knee flexion AROM to improve ability to complete bending activity.  Baseline: see above Goal status: INITIAL  5.  Patient will be independent with advanced home program to progress strength phase of rehab independently.  Baseline: initial HEP issued  Goal status: INITIAL   PLAN:  PT FREQUENCY: 2x/week  PT DURATION: 12 weeks  PLANNED INTERVENTIONS: 97164- PT Re-evaluation, 97750- Physical Performance Testing, 97110-Therapeutic exercises, 97530- Therapeutic activity, [id number]- Neuromuscular re-education, 97535- Self Care, 02859- Manual therapy, [id number]- Gait training, [phone]- Electrical stimulation (unattended), [id number]- Electrical stimulation (manual), 97016- Vasopneumatic device, 20560 (1-2 muscles), 20561 (3+ muscles)- Dry Needling, Cryotherapy, and Moist heat  PLAN FOR NEXT SESSION: progress per protocol; game ready prn.   Lexandra Rettke, PT, DPT, ATC 06/06/24 11:01 AM

## 2024-06-20 ENCOUNTER — Telehealth: Admitting: Physician Assistant

## 2024-06-20 ENCOUNTER — Encounter

## 2024-06-20 ENCOUNTER — Ambulatory Visit: Payer: Self-pay

## 2024-06-20 DIAGNOSIS — K047 Periapical abscess without sinus: Secondary | ICD-10-CM

## 2024-06-20 MED ORDER — CLINDAMYCIN HCL 300 MG PO CAPS: 300.0000 mg | ORAL_CAPSULE | Freq: Three times a day (TID) | ORAL | 0 refills | Status: AC

## 2024-06-20 NOTE — Patient Instructions (Signed)
 Jeoffrey Chill, thank you for joining Elsie Velma Lunger, PA-C for today's virtual visit.  While this provider is not your primary care provider (PCP), if your PCP is located in our provider database this encounter information will be shared with them immediately following your visit.   A [facility] MyChart account gives you access to today's visit and all your visits, tests, and labs performed at [facility]  click here if you don't have a [facility] MyChart account or go to [email or web address]  Consent: (Patient) Art gallery manager provided verbal consent for this virtual visit at the beginning of the encounter.  Current Medications:  Current Outpatient Medications:    cetirizine (ZYRTEC) 10 MG tablet, Take 10 mg by mouth daily., Disp: , Rfl:    EPINEPHrine  0.3 mg/0.3 mL IJ SOAJ injection, Inject 0.3 mg into the muscle as needed for anaphylaxis., Disp: 1 each, Rfl: 1   etonogestrel -ethinyl estradiol  (NUVARING) 0.12-0.015 MG/24HR vaginal ring, Insert vaginally and leave in place for 3 consecutive weeks, then remove for 1 week., Disp: 3 each, Rfl: 3   meloxicam  (MOBIC ) 15 MG tablet, TAKE 1 TABLET BY MOUTH DAILY WITH A MEAL FOR 2 WEEKS THEN DAILY AS NEEDED., Disp: 90 tablet, Rfl: 0   methylPREDNISolone  (MEDROL  DOSEPAK) 4 MG TBPK tablet, Take as directed by package insert., Disp: 21 tablet, Rfl: 0   tirzepatide  (MOUNJARO ) 10 MG/0.5ML Pen, Liposlim.  Tirzepatide /Pyridoxine/Thiamine/L-Carnitine 10mg /mL.  Inject 2.5 mg/25 units subcu weekly for 4 weeks then 5 mg/50 units subcu weekly for 4 weeks then 7.5 mg/75 units subcu weekly for 4 weeks then 10 mg/100 units subcu weekly for 4 weeks then 15 mg/150 units subcu weekly, Disp: 3 mL, Rfl: 11   valACYclovir  (VALTREX ) 500 MG tablet, Take 1 tablet (500 mg total) by mouth daily. Can increase to twice a day for 5 days in the event of a recurrence, Disp: 90 tablet, Rfl: 2   Medications ordered in this encounter:  No orders of the defined types  were placed in this encounter.    *If you need refills on other medications prior to your next appointment, please contact your pharmacy*  Follow-Up: Call back or seek an in-person evaluation if the symptoms worsen or if the condition fails to improve as anticipated.  [facility] [phone]  Other Instructions Dental Abscess  A dental abscess is an area of pus in or around a tooth. It comes from an infection. It can cause pain and other symptoms. Treatment will help with symptoms and prevent the infection from spreading. What are the causes? This condition is caused by an infection in or around the tooth. This can be from: Very bad tooth decay (cavities). A bad injury to the tooth, such as a broken or chipped tooth. What increases the risk? The risk to get an abscess is higher in males. It is also more likely in people who: Have dental decay. Have very bad gum disease. Eat sugary snacks between meals. Use tobacco. Have diabetes. Have a weak disease-fighting system (immune system). Do not brush their teeth regularly. What are the signs or symptoms? Some mild symptoms are: Tenderness. Bad breath. Fever. A sharp, sour taste in the mouth. Pain in and around the infected tooth. Worse symptoms of this condition include: Swollen neck glands. Chills. Pus draining around the tooth. Swelling and redness around the tooth, the mouth, or the face. Very bad pain in and around the tooth. The worst symptoms can include: Difficulty swallowing. Difficulty opening your mouth. Feeling like  you may vomit or vomiting. How is this treated? This is treated by getting rid of the infection. Your dentist will discuss ways to do this, including: Antibiotic medicines. Antibacterial mouth rinse. An incision in the abscess to drain out the pus. A root canal. Removing the tooth. Follow these instructions at home: Medicines Take over-the-counter and prescription medicines only as  told by your dentist. If you were prescribed an antibiotic medicine, take it as told by your dentist. Do not stop taking it even if you start to feel better. If you were prescribed a gel that has numbing medicine in it, use it exactly as told. Ask your dentist if you should avoid driving or using machines while you are taking your medicine. General instructions Rinse your mouth often with salt water. To make salt water, dissolve -1 tsp (3-6 g) of salt in 1 cup (237 mL) of warm water. Eat a soft diet while your mouth is healing. Drink enough fluid to keep your pee (urine) pale yellow. Do not apply heat to the outside of your mouth. Do not smoke or use any products that contain nicotine or tobacco. If you need help quitting, ask your dentist. Keep all follow-up visits. Prevent an abscess Brush your teeth every morning and every night. Use fluoride toothpaste. Floss your teeth each day. Get dental cleanings as often as told by your dentist. Think about getting dental sealant put on teeth that have deep holes (decay). Drink water that has fluoride in it. Most tap water has fluoride. Check the label on bottled water to see if it has fluoride in it. Drink water instead of sugary drinks. Eat healthy meals and snacks. Wear a mouth guard or face shield when you play sports. Contact a doctor if: Your pain is worse and medicine does not help. Get help right away if: You have a fever or chills. Your symptoms suddenly get worse. You have a very bad headache. You have problems breathing or swallowing. You have trouble opening your mouth. You have swelling in your neck or close to your eye. These symptoms may be an emergency. Get help right away. Call your local emergency services (911 in the [place] Do not wait to see if the symptoms will go away. Do not drive yourself to the hospital. Summary A dental abscess is an area of pus in or around a tooth. It is caused by an infection. Treatment will  help with symptoms and prevent the infection from spreading. Take over-the-counter and prescription medicines only as told by your dentist. To prevent an abscess, take good care of your teeth. Brush your teeth every morning and night. Use floss every day. Get dental cleanings as often as told by your dentist. This information is not intended to replace advice given to you by your health care provider. Make sure you discuss any questions you have with your health care provider. Document Revised: 01/14/2021 Document Reviewed: 01/15/2021 Elsevier Patient Education  2024 [employer]   If you have been instructed to have an in-person evaluation today at a local Urgent Care facility, please use the link below. It will take you to a list of all of our available [facility], including address, phone number and hours of operation. Please do not delay care.  [facility]  If you or a family member do not have a primary care provider, use the link below to schedule a visit and establish care. When you choose a [facility] primary care physician  or advanced practice provider, you gain a long-term partner in health. Find a Primary Care Provider  Learn more about [facility]'s in-office and virtual care options: [facility] - Get Care Now

## 2024-06-20 NOTE — Progress Notes (Signed)
 Virtual Visit Consent   Whitney Turner, you are scheduled for a virtual visit with a [facility] provider today. Just as with appointments in the office, your consent must be obtained to participate. Your consent will be active for this visit and any virtual visit you may have with one of our providers in the next 365 days. If you have a MyChart account, a copy of this consent can be sent to you electronically.  As this is a virtual visit, video technology does not allow for your provider to perform a traditional examination. This may limit your provider's ability to fully assess your condition. If your provider identifies any concerns that need to be evaluated in person or the need to arrange testing (such as labs, EKG, etc.), we will make arrangements to do so. Although advances in technology are sophisticated, we cannot ensure that it will always work on either your end or our end. If the connection with a video visit is poor, the visit may have to be switched to a telephone visit. With either a video or telephone visit, we are not always able to ensure that we have a secure connection.  By engaging in this virtual visit, you consent to the provision of healthcare and authorize for your insurance to be billed (if applicable) for the services provided during this visit. Depending on your insurance coverage, you may receive a charge related to this service.  I need to obtain your verbal consent now. Are you willing to proceed with your visit today? Whitney Turner has provided verbal consent on 06/20/2024 for a virtual visit (video or telephone). Whitney Turner, [place]  Date: 06/20/2024 2:36 PM   Virtual Visit via Video Note   I, Whitney Turner, connected with  Whitney Turner  ([id number], Feb 04, 1976) on 06/20/24 at  2:30 PM EDT by a video-enabled telemedicine application and verified that I am speaking with the correct person using two identifiers.  Location: Patient: Virtual Visit Location  Patient: Home Provider: Virtual Visit Location Provider: Home Office   I discussed the limitations of evaluation and management by telemedicine and the availability of in person appointments. The patient expressed understanding and agreed to proceed.    History of Present Illness: Whitney Turner is a 48 y.o. who identifies as a female who was assigned female at birth, and is being seen today for concern of dental abscess. Patient endorses 2 weeks of pain and swelling around R upper tooth that has been gradually worsening since onset. Pain was very throbbing and severe but has now improved slightly, but with increased swelling. Denies fever, chills.      HPI: HPI  Problems:  Patient Active Problem List   Diagnosis Date Noted   Abnormal weight gain 03/05/2024   Chronic left hip pain 02/27/2024   Tibialis posterior tendinitis, left 03/10/2023   Mouth ulcer 03/08/2023   Sore throat 03/08/2023   Poor concentration 12/30/2020   Hyperactive behavior 12/30/2020   Chronic left shoulder pain 11/17/2020   Elevated LDL cholesterol level 12/24/2019   Right lower quadrant pain 12/24/2019   Right upper quadrant pain 12/24/2019   Effusion of right knee 06/25/2019   Hemorrhoids 12/04/2018   CIN I (cervical intraepithelial neoplasia I) 04/03/2018   Lateral epicondylitis of left elbow 02/15/2018   Left hand pain 02/23/2016   History of repair of ACL of Left knee 01/26/2016   Plantar fasciitis, bilateral 12/29/2015   Genital herpes 12/01/2012   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/2012   Allergic rhinitis 12/01/2012  Seasonal allergies 12/01/2012    Allergies:  Allergies  Allergen Reactions   Amoxicillin  Shortness Of Breath, Itching, Nausea And Vomiting and Swelling    Fever and nasal drainage   Medications:  Current Outpatient Medications:    clindamycin  (CLEOCIN ) 300 MG capsule, Take 1 capsule (300 mg total) by mouth 3 (three) times daily for 7 days., Disp: 21 capsule, Rfl: 0   cetirizine (ZYRTEC) 10 MG  tablet, Take 10 mg by mouth daily., Disp: , Rfl:    EPINEPHrine  0.3 mg/0.3 mL IJ SOAJ injection, Inject 0.3 mg into the muscle as needed for anaphylaxis., Disp: 1 each, Rfl: 1   etonogestrel -ethinyl estradiol  (NUVARING) 0.12-0.015 MG/24HR vaginal ring, Insert vaginally and leave in place for 3 consecutive weeks, then remove for 1 week., Disp: 3 each, Rfl: 3   meloxicam  (MOBIC ) 15 MG tablet, TAKE 1 TABLET BY MOUTH DAILY WITH A MEAL FOR 2 WEEKS THEN DAILY AS NEEDED., Disp: 90 tablet, Rfl: 0   tirzepatide  (MOUNJARO ) 10 MG/0.5ML Pen, Liposlim.  Tirzepatide /Pyridoxine/Thiamine/L-Carnitine 10mg /mL.  Inject 2.5 mg/25 units subcu weekly for 4 weeks then 5 mg/50 units subcu weekly for 4 weeks then 7.5 mg/75 units subcu weekly for 4 weeks then 10 mg/100 units subcu weekly for 4 weeks then 15 mg/150 units subcu weekly, Disp: 3 mL, Rfl: 11   valACYclovir  (VALTREX ) 500 MG tablet, Take 1 tablet (500 mg total) by mouth daily. Can increase to twice a day for 5 days in the event of a recurrence, Disp: 90 tablet, Rfl: 2  Observations/Objective: Patient is well-developed, well-nourished in no acute distress.  Resting comfortably at home.  Head is normocephalic, atraumatic.  No labored breathing. Speech is clear and coherent with logical content.  Patient is alert and oriented at baseline.   Assessment and Plan: 1. Dental abscess (Primary) - clindamycin  (CLEOCIN ) 300 MG capsule; Take 1 capsule (300 mg total) by mouth 3 (three) times daily for 7 days.  Dispense: 21 capsule; Refill: 0  Has dental appt Friday. No alarm signs or symptoms. Will start antibiotics -- Clindamycin  -- per orders. Supportive measures and OTC medications reviewed with patient. Work note declined.   Follow Up Instructions: I discussed the assessment and treatment plan with the patient. The patient was provided an opportunity to ask questions and all were answered. The patient agreed with the plan and demonstrated an understanding of the  instructions.  A copy of instructions were sent to the patient via MyChart unless otherwise noted below.   The patient was advised to call back or seek an in-person evaluation if the symptoms worsen or if the condition fails to improve as anticipated.    Whitney Velma Lunger, PA-C

## 2024-06-20 NOTE — Telephone Encounter (Signed)
 FYI Only or Action Required?: FYI only for provider.  Patient was last seen in primary care on 04/02/2024 by Curtis Debby PARAS, MD.  Called Nurse Triage reporting Dental Pain.  Symptoms began several weeks ago.  Interventions attempted: Nothing.  Symptoms are: unchanged.  Triage Disposition: See Dentist Within 24 Hours  Patient/caregiver understands and will follow disposition?: Yes  Copied from CRM #[id number]. Topic: Clinical - Red Word Triage >> Jun 20, 2024  1:47 PM Merlynn LABOR wrote: Red Word that prompted transfer to Nurse Triage: Tooth Infection/Pain. Unable to see dentist until Friday. Wants to know if she can get antibiotics. Reason for Disposition  [1] Face swelling AND [2] no fever  Answer Assessment - Initial Assessment Questions 1. LOCATION: Which tooth is hurting?  (e.g., right-side/left-side, upper/lower, front/back)     Upper Right Side  2. ONSET: When did the toothache start?  (e.g., hours, days)      2 Weeks ago  3. SEVERITY: How bad is the toothache?  (Scale 1-10; mild, moderate or severe)     4-5  4. SWELLING: Is there any visible swelling of your face?     Swelling around the cheek, around the gumline  5. OTHER SYMPTOMS: Do you have any other symptoms? (e.g., fever)     Unsure  6. PREGNANCY: Is there any chance you are pregnant? When was your last menstrual period?     No and No  Protocols used: Toothache-A-AH

## 2024-06-21 ENCOUNTER — Ambulatory Visit

## 2024-06-21 DIAGNOSIS — R2689 Other abnormalities of gait and mobility: Secondary | ICD-10-CM

## 2024-06-21 DIAGNOSIS — M25562 Pain in left knee: Secondary | ICD-10-CM | POA: Diagnosis not present

## 2024-06-21 DIAGNOSIS — M6281 Muscle weakness (generalized): Secondary | ICD-10-CM

## 2024-06-21 DIAGNOSIS — R600 Localized edema: Secondary | ICD-10-CM

## 2024-06-21 NOTE — Therapy (Signed)
 OUTPATIENT PHYSICAL THERAPY LOWER EXTREMITY TREATMENT   Patient Name: Whitney Turner MRN: [id number] DOB:February 14, 1976, 48 y.o., female Today's Date: 06/21/2024  END OF SESSION:  PT End of Session - 06/21/24 1320     Visit Number 6    Number of Visits 25    Date for PT Re-Evaluation 08/04/24    Authorization Type [employer]    PT Start Time 1320    PT Stop Time 1400    PT Time Calculation (min) 40 min    Activity Tolerance Patient tolerated treatment well    Behavior During Therapy WFL for tasks assessed/performed              Past Medical History:  Diagnosis Date   Allergy    Heart murmur    Herpes    Past Surgical History:  Procedure Laterality Date   HERNIA REPAIR  1982   KNEE SURGERY  08/2011   TONSILECTOMY/ADENOIDECTOMY WITH MYRINGOTOMY  2000   Patient Active Problem List   Diagnosis Date Noted   Abnormal weight gain 03/05/2024   Chronic left hip pain 02/27/2024   Tibialis posterior tendinitis, left 03/10/2023   Mouth ulcer 03/08/2023   Sore throat 03/08/2023   Poor concentration 12/30/2020   Hyperactive behavior 12/30/2020   Chronic left shoulder pain 11/17/2020   Elevated LDL cholesterol level 12/24/2019   Right lower quadrant pain 12/24/2019   Right upper quadrant pain 12/24/2019   Effusion of right knee 06/25/2019   Hemorrhoids 12/04/2018   CIN I (cervical intraepithelial neoplasia I) 04/03/2018   Lateral epicondylitis of left elbow 02/15/2018   Left hand pain 02/23/2016   History of repair of ACL of Left knee 01/26/2016   Plantar fasciitis, bilateral 12/29/2015   Genital herpes 12/01/2012   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 Knee just feels tight on the outside, but no pain.   PERTINENT HISTORY: left knee arthroscopy with medial meniscectomy and revision ACL reconstruction with allograft 05/07/24 Previous LT ACLR  PAIN:  Are you having pain?No  PRECAUTIONS: Other: see protocol    WEIGHT BEARING RESTRICTIONS: Yes LLE WBAT in knee immobilizer   FALLS:  Has patient fallen in last 6 months? No  LIVING ENVIRONMENT: Lives with: lives with their family Lives in: House/apartment Stairs: Yes: Internal: flight steps; on right going up and External: 3 steps; none Has following equipment at home: Crutches, knee immobilizer  OCCUPATION: softball umpire    PATIENT GOALS: full range of motion and be able to jog.   NEXT MD VISIT: October 2025   OBJECTIVE:  Note: Objective measures were completed at Evaluation unless otherwise noted.  DIAGNOSTIC FINDINGS: IMPRESSION: 1. Small vertically oriented tear extending through the superior and inferior articular surfaces of the free edge of the root of the posterior horn of the medial meniscus. Vertically oriented tear extending through the inferior articular surface of the peripheral third of the meniscal triangle of the more medial aspect of the posterior horn of the medial  meniscus. Tiny horizontal linear tear extending through the superior articular surface and the peripheral wall of the mid to anterior aspect of the body of the medial meniscus. 2. Full-thickness cartilage defect within the inferior aspect of the medial trochlea with subchondral cystic change. 3. Full-thickness cartilage loss within the mid transverse, posterior weight-bearing lateral femoral condyle extending into the posterior nonweightbearing lateral  femoral condyle. Focal defect within the adjacent posterolateral tibial plateau cartilage. 4. At the distal aspect of the tibial tunnel for the ACL reconstruction, there is mild fluid bright signal bordering the medial cortex of the proximal tibial metaphysis. There is also mild-to-moderate marrow edema surrounding the distal tibial tunnel in this region. This may represent mild stress reaction.  PATIENT SURVEYS:  LEFS: 12/80    SENSATION: Not tested  EDEMA:  Moderate swelling about Lt knee     PALPATION: Not assessed   LOWER EXTREMITY ROM:  Active ROM Right eval Left eval Left AROM 05/17/24 05/28/24 Left  06/21/24 Left   Hip flexion       Hip extension       Hip abduction       Hip adduction       Hip internal rotation       Hip external rotation       Knee flexion  45 120 120 130  Knee extension  Lacking 5 Lacking 5 Lacking 3 1  Ankle dorsiflexion       Ankle plantarflexion       Ankle inversion       Ankle eversion        (Blank rows = not tested)  LOWER EXTREMITY MMT:   MMT Right eval Left eval  Hip flexion    Hip extension    Hip abduction    Hip adduction    Hip internal rotation    Hip external rotation    Knee flexion  deferred  Knee extension  Deferred   Ankle dorsiflexion    Ankle plantarflexion    Ankle inversion    Ankle eversion     (Blank rows = not tested)    FUNCTIONAL TESTS:  Not assessed   GAIT: Distance walked: 10 ft  Assistive device utilized: Crutches Level of assistance: Modified independence Comments: NWB LLE in knee immobilizer  [facility] Adult PT Treatment:                                                DATE: 06/21/24 Therapeutic Exercise: Recumbent bike level 2 x 5 minutes  Manual Therapy: Bridge with march 2 x 10  Neuromuscular re-ed: STM Lt IT band, quadriceps Therapeutic Activity: Squat to table x 10 Wall squat with physioball 2 x 10  Deadlift 10 lbs 2 x 10     [facility] Adult PT Treatment:                                                 DATE: 06/06/24  Manual Therapy: IASTM LT quadriceps, IT band  Neuromuscular re-ed: 3 way hip on airex x 10 each  Hip bridge on physioball 2 x 10  Therapeutic Activity: Mini wall squats 2 x 10  Lateral step down 2 x 10; 4 inch step  Mini squat with  BUE support 2 x 10     [facility] Adult PT Treatment:                                                DATE: 05/31/24 Therapeutic Exercise: Recumbent bike no resistance x 5 minutes Standing SL calf raise 2 x 10  HEP review/update  Manual Therapy: IASTM LT quadriceps, IT band  Neuromuscular re-ed: SLR 2 x 10  Sidelying hip abduction 2 x 10 @ 2 lbs  Therapeutic Activity: Mini wall squat 2 x 10  Step up knee driver 6 inch 2 x 10  Modalities: Game ready (VASO) Lt knee x 10 minutes medium compression  Self Care: Protocol review  PATIENT EDUCATION:  Education details: HEP update Person educated: Patient Education method: Explanation, demo, cues, handout Education comprehension: verbalized understanding, returned demo, cues   HOME EXERCISE PROGRAM: Access Code: [email or web address] URL: [email or web address] Date: 06/21/2024 Prepared by: Lucie Meeter  Exercises - Long Sitting Calf Stretch with Strap  - 3 x daily - 7 x weekly - 3 sets - 30 sec  hold - Sidelying Hip Abduction  - 1 x daily - 7 x weekly - 3 sets - 10 reps - 3-5 sec  hold - Prone Hip Extension  - 2 x daily - 7 x weekly - 1 sets - 10 reps - 3 sec  hold - Standing Terminal Knee Extension with Resistance  - 1 x daily - 7 x weekly - 2 sets - 10 reps - Active Straight Leg Raise with Quad Set  - 1 x daily - 7 x weekly - 2 sets - 10 reps - Supine Hamstring Stretch with Strap  - 1 x daily - 7 x weekly - 3 sets - 30 sec  hold - Standing Single Leg Heel Raise  - 1 x daily - 7 x weekly - 2 sets - 10 reps - Kettlebell Deadlift  - 1 x daily - 7 x weekly - 2 sets - 10 reps - Wall Squat with Swiss Ball  - 1 x daily - 7 x weekly - 2 sets - 10 reps - Bridge with Heels  on [employer]  - 1 x daily - 7 x weekly - 2 sets - 10 reps  ASSESSMENT:  CLINICAL IMPRESSION: Patient returns to PT after [place] vacation reporting overall that the knee is feeling good. She reports tightness about IT band with partial release from manual therapy. She has full and pain free Lt knee AROM. She did have Lt anterior knee with squats to table, so this was discontinued. No pain with wall squats. Introduced deadlift today with patient requiring intermittent cues to reduce trunk flexion.   EVAL: Patient is a 48 y.o. female who was seen today for physical therapy evaluation and treatment for s/p Lt knee arthroscopy with medial meniscectomy and revision ACL reconstruction with allograft on 05/07/24. She demonstrates ROM, strength, gait and balance deficits that are consistent with her recent post-operative status. She will benefit from skilled PT to address the above stated deficits in order to return to optimal function.   OBJECTIVE IMPAIRMENTS: Abnormal gait, decreased activity tolerance, decreased balance, decreased endurance, decreased mobility, difficulty walking, decreased ROM, decreased strength, increased edema, impaired flexibility, improper body mechanics, and pain.    GOALS: Goals reviewed with patient? Yes  SHORT TERM GOALS: Target date: 06/21/2024   Patient will be  independent and compliant with initial HEP.   Baseline: issued at eval Goal status: MET  2.  Patient will be able to perform LLE SLR x 10 without quad lag indicative of improved knee stability.  Baseline: unable 05/31/24: no quad lag with SLR Goal status: MET  3.  Patient will demonstrate at least 100 degrees of Lt knee flexion AROM to improve ability to complete sit to stand transfers.  Baseline: see above Goal status: MET  4.  Patient will demonstrate full Lt knee extension AROM to improve gait mechanics.  Baseline: see above Goal status: MET  5.  Patient will ambulate community distances without AD  without Lt knee pain.  Baseline: WBAT LLE 06/21/24: community ambulator without pain  Goal status: MET    LONG TERM GOALS: Target date: 08/04/24  Patient will demonstrate 5/5 Lt knee strength to improve stability with curb/stair negotiation.  Baseline: not assessed.  Goal status: INITIAL  2.  Patient will demonstrate normalized squat mechanics without pain.  Baseline: unable Goal status: INITIAL  3.  Patient will score >/= 50/80 on the LEFS (MCID is 9) to signify clinically meaningful improvement in functional abilities.   Baseline: see above  Goal status: INITIAL  4.  Patient will demonstrate at least 120 degrees of Lt knee flexion AROM to improve ability to complete bending activity.  Baseline: see above Goal status: INITIAL  5.  Patient will be independent with advanced home program to progress strength phase of rehab independently.  Baseline: initial HEP issued  Goal status: INITIAL   PLAN:  PT FREQUENCY: 2x/week  PT DURATION: 12 weeks  PLANNED INTERVENTIONS: 97164- PT Re-evaluation, 97750- Physical Performance Testing, 97110-Therapeutic exercises, 97530- Therapeutic activity, [id number]- Neuromuscular re-education, 97535- Self Care, [id number]- Manual therapy, [id number]- Gait training, [phone]- Electrical stimulation (unattended), [id number]- Electrical stimulation (manual), 97016- Vasopneumatic device, 20560 (1-2 muscles), 20561 (3+ muscles)- Dry Needling, Cryotherapy, and Moist heat  PLAN FOR NEXT SESSION: progress per protocol; game ready prn.   Maron Stanzione, PT, DPT, ATC 06/21/24 2:03 PM

## 2024-06-28 ENCOUNTER — Ambulatory Visit: Attending: Orthopaedic Surgery

## 2024-06-28 DIAGNOSIS — M6281 Muscle weakness (generalized): Secondary | ICD-10-CM | POA: Diagnosis present

## 2024-06-28 DIAGNOSIS — R2689 Other abnormalities of gait and mobility: Secondary | ICD-10-CM | POA: Diagnosis present

## 2024-06-28 DIAGNOSIS — M25562 Pain in left knee: Secondary | ICD-10-CM | POA: Insufficient documentation

## 2024-06-28 DIAGNOSIS — R600 Localized edema: Secondary | ICD-10-CM | POA: Diagnosis present

## 2024-06-28 NOTE — Therapy (Signed)
 OUTPATIENT PHYSICAL THERAPY LOWER EXTREMITY TREATMENT   Patient Name: Whitney Turner MRN: [id number] DOB:23-Aug-1976, 48 y.o., female Today's Date: 06/28/2024  END OF SESSION:  PT End of Session - 06/28/24 1105     Visit Number 7    Number of Visits 25    Date for PT Re-Evaluation 08/04/24    Authorization Type [employer]    PT Start Time 1103    PT Stop Time 1145    PT Time Calculation (min) 42 min    Activity Tolerance Patient tolerated treatment well    Behavior During Therapy WFL for tasks assessed/performed               Past Medical History:  Diagnosis Date   Allergy    Heart murmur    Herpes    Past Surgical History:  Procedure Laterality Date   HERNIA REPAIR  1982   KNEE SURGERY  08/2011   TONSILECTOMY/ADENOIDECTOMY WITH MYRINGOTOMY  2000   Patient Active Problem List   Diagnosis Date Noted   Abnormal weight gain 03/05/2024   Chronic left hip pain 02/27/2024   Tibialis posterior tendinitis, left 03/10/2023   Mouth ulcer 03/08/2023   Sore throat 03/08/2023   Poor concentration 12/30/2020   Hyperactive behavior 12/30/2020   Chronic left shoulder pain 11/17/2020   Elevated LDL cholesterol level 12/24/2019   Right lower quadrant pain 12/24/2019   Right upper quadrant pain 12/24/2019   Effusion of right knee 06/25/2019   Hemorrhoids 12/04/2018   CIN I (cervical intraepithelial neoplasia I) 04/03/2018   Lateral epicondylitis of left elbow 02/15/2018   Left hand pain 02/23/2016   History of repair of ACL of Left knee 01/26/2016   Plantar fasciitis, bilateral 12/29/2015   Genital herpes 12/01/2012   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 Rode the bike at the gym and did some leg strengthening exercises without knee pain. The knee was sore the next day, but was not swollen. No pain right now.   PERTINENT HISTORY: left knee arthroscopy with medial meniscectomy and revision ACL reconstruction with allograft 05/07/24 Previous LT ACLR  PAIN:  Are you having pain?No  PRECAUTIONS: Other: see protocol    WEIGHT BEARING RESTRICTIONS: Yes LLE WBAT in knee immobilizer   FALLS:  Has patient fallen in last 6 months? No  LIVING ENVIRONMENT: Lives with: lives with their family Lives in: House/apartment Stairs: Yes: Internal: flight steps; on right going up and External: 3 steps; none Has following equipment at home: Crutches, knee immobilizer  OCCUPATION: softball umpire    PATIENT GOALS: full range of motion and be able to jog.   NEXT MD VISIT: October 2025   OBJECTIVE:  Note: Objective measures were completed at Evaluation unless otherwise noted.  DIAGNOSTIC FINDINGS: IMPRESSION: 1. Small vertically oriented tear extending through the superior and inferior articular surfaces of the free edge of the root of the posterior horn of the medial meniscus. Vertically oriented tear extending through the inferior articular surface of the peripheral third of  the meniscal triangle of the more medial aspect of the posterior horn of the medial meniscus. Tiny horizontal linear tear extending through the superior articular surface and the peripheral wall of the mid to anterior aspect of the body of the medial meniscus. 2. Full-thickness cartilage defect within the inferior aspect of the medial trochlea with subchondral cystic change. 3. Full-thickness cartilage loss within the mid transverse, posterior  weight-bearing lateral femoral condyle extending into the posterior nonweightbearing lateral femoral condyle. Focal defect within the adjacent posterolateral tibial plateau cartilage. 4. At the distal aspect of the tibial tunnel for the ACL reconstruction, there is mild fluid bright signal bordering the medial cortex of the proximal tibial metaphysis. There is also mild-to-moderate marrow edema surrounding the distal tibial tunnel in this region. This may represent mild stress reaction.  PATIENT SURVEYS:  LEFS: 12/80    SENSATION: Not tested  EDEMA:  Moderate swelling about Lt knee     PALPATION: Not assessed   LOWER EXTREMITY ROM:  Active ROM Right eval Left eval Left AROM 05/17/24 05/28/24 Left  06/21/24 Left   Hip flexion       Hip extension       Hip abduction       Hip adduction       Hip internal rotation       Hip external rotation       Knee flexion  45 120 120 130  Knee extension  Lacking 5 Lacking 5 Lacking 3 1  Ankle dorsiflexion       Ankle plantarflexion       Ankle inversion       Ankle eversion        (Blank rows = not tested)  LOWER EXTREMITY MMT:   MMT Right eval Left eval  Hip flexion    Hip extension    Hip abduction    Hip adduction    Hip internal rotation    Hip external rotation    Knee flexion  deferred  Knee extension  Deferred   Ankle dorsiflexion    Ankle plantarflexion    Ankle inversion    Ankle eversion     (Blank rows = not tested)    FUNCTIONAL TESTS:  Not assessed   GAIT: Distance walked: 10 ft  Assistive device utilized: Crutches Level of assistance: Modified independence Comments: NWB LLE in knee immobilizer  [facility] Adult PT Treatment:                                                DATE: 06/28/24 Therapeutic Exercise: Recumbent bike level 3 x 5 minutes  Manual Therapy: IASTM Lt IT band, quadriceps Neuromuscular re-ed: Lateral band walk green band at shins 2 sets d/b x 15 ft  Clamshells 2 x 10 black band   Therapeutic Activity: Leg press x 10 @ 115 lbs, x 10 @ 120 lbs Lateral step down 4 inch 2 x 10  Squat to table 2 x 10   Self Care: Decrease volume of gym activity  Protocol review    [facility] Adult PT Treatment:                                                DATE: 06/21/24 Therapeutic Exercise:  Recumbent bike level 2 x 5 minutes  Manual Therapy: Bridge with march 2 x 10  Neuromuscular re-ed: STM Lt IT band, quadriceps Therapeutic Activity: Squat to table x 10 Wall squat with physioball 2 x 10  Deadlift 10 lbs 2 x 10     [facility] Adult PT Treatment:                                                DATE: 06/06/24  Manual Therapy: IASTM LT quadriceps, IT band  Neuromuscular re-ed: 3 way hip on airex x 10 each  Hip bridge on physioball 2 x 10  Therapeutic Activity: Mini wall squats 2 x 10  Lateral step down 2 x 10; 4 inch step  Mini squat with BUE support 2 x 10    PATIENT EDUCATION:  Education details: HEP update Person educated: Patient Education method: Explanation, demo, cues, handout Education comprehension: verbalized understanding, returned demo, cues   HOME EXERCISE PROGRAM: Access Code: [email or web address] URL: [email or web address] Date: 06/28/2024 Prepared by: Lucie Meeter  Exercises - Sidelying Hip Abduction  - 1 x daily - 7 x weekly - 3 sets - 10 reps - 3-5 sec  hold - Prone Hip Extension  - 2 x daily - 7 x weekly - 1 sets - 10 reps - 3 sec  hold - Active Straight Leg Raise with Quad Set  - 1 x daily - 7 x weekly - 2 sets - 10 reps - Supine Hamstring Stretch with Strap  - 1 x daily - 7 x weekly - 3 sets - 30 sec  hold - Standing Single Leg Heel Raise  - 1 x daily - 7 x weekly - 2 sets - 10 reps - Kettlebell Deadlift  - 1 x daily - 7 x weekly - 2 sets - 10 reps - Wall Squat with Swiss Ball  - 1 x daily - 7 x weekly - 2 sets - 10 reps - Bridge with Heels on [employer]  - 1 x daily - 7 x weekly - 2 sets - 10 reps - Side Stepping with Resistance at Ankles  - 1  x daily - 7 x weekly - 2 sets - 10 reps - Clamshell with Resistance  - 1 x daily - 7 x weekly - 2 sets - 10 reps - Squat with Chair Touch  - 1 x daily - 7 x weekly - 2 sets - 10 reps  ASSESSMENT:  CLINICAL IMPRESSION: Continued with LE strength progression focusing on CKC strengthening. With lateral step down she reports pain localized to left lateral hip that is resolved once form is corrected for hip drop. No lateral hip pain with other targeted gluteal strengthening. Good form and control noted with squats to table. We discussed not overdoing it at the gym as her volume/repetitions of exercises at the gym earlier this week likely resulted in localized knee soreness with patient verbalizing understanding.   EVAL: Patient is a 48 y.o. female who was seen today for physical therapy evaluation and treatment for s/p Lt knee arthroscopy with medial meniscectomy and revision ACL reconstruction with allograft on 05/07/24. She demonstrates ROM, strength, gait and balance deficits that are consistent with her recent post-operative status. She will benefit from skilled PT to address the above stated deficits in order to return to optimal function.   OBJECTIVE IMPAIRMENTS: Abnormal gait, decreased activity  tolerance, decreased balance, decreased endurance, decreased mobility, difficulty walking, decreased ROM, decreased strength, increased edema, impaired flexibility, improper body mechanics, and pain.    GOALS: Goals reviewed with patient? Yes  SHORT TERM GOALS: Target date: 06/21/2024   Patient will be independent and compliant with initial HEP.   Baseline: issued at eval Goal status: MET  2.  Patient will be able to perform LLE SLR x 10 without quad lag indicative of improved knee stability.  Baseline: unable 05/31/24: no quad lag with SLR Goal status: MET  3.  Patient will demonstrate at least 100 degrees of Lt knee flexion AROM to improve ability to complete sit to stand transfers.  Baseline:  see above Goal status: MET  4.  Patient will demonstrate full Lt knee extension AROM to improve gait mechanics.  Baseline: see above Goal status: MET  5.  Patient will ambulate community distances without AD without Lt knee pain.  Baseline: WBAT LLE 06/21/24: community ambulator without pain  Goal status: MET    LONG TERM GOALS: Target date: 08/04/24  Patient will demonstrate 5/5 Lt knee strength to improve stability with curb/stair negotiation.  Baseline: not assessed.  Goal status: INITIAL  2.  Patient will demonstrate normalized squat mechanics without pain.  Baseline: unable Goal status: INITIAL  3.  Patient will score >/= 50/80 on the LEFS (MCID is 9) to signify clinically meaningful improvement in functional abilities.   Baseline: see above  Goal status: INITIAL  4.  Patient will demonstrate at least 120 degrees of Lt knee flexion AROM to improve ability to complete bending activity.  Baseline: see above Goal status: INITIAL  5.  Patient will be independent with advanced home program to progress strength phase of rehab independently.  Baseline: initial HEP issued  Goal status: INITIAL   PLAN:  PT FREQUENCY: 2x/week  PT DURATION: 12 weeks  PLANNED INTERVENTIONS: 97164- PT Re-evaluation, 97750- Physical Performance Testing, 97110-Therapeutic exercises, 97530- Therapeutic activity, [id number]- Neuromuscular re-education, 97535- Self Care, [id number]- Manual therapy, [id number]- Gait training, [phone]- Electrical stimulation (unattended), [id number]- Electrical stimulation (manual), 97016- Vasopneumatic device, 20560 (1-2 muscles), 20561 (3+ muscles)- Dry Needling, Cryotherapy, and Moist heat  PLAN FOR NEXT SESSION: progress per protocol; game ready prn. Consider TPDN quad,hamstring,glute   Lucie Meeter, PT, DPT, ATC 06/28/24 12:45 PM

## 2024-07-02 ENCOUNTER — Encounter: Payer: Self-pay | Admitting: Sports Medicine

## 2024-07-02 DIAGNOSIS — R635 Abnormal weight gain: Secondary | ICD-10-CM

## 2024-07-02 MED ORDER — TIRZEPATIDE 10 MG/0.5ML ~~LOC~~ SOAJ: SUBCUTANEOUS | 11 refills | Status: AC

## 2024-07-02 NOTE — Telephone Encounter (Signed)
 Patient requesting rx rf of mounjaro - has been off of this for a short time - will strength need to be adjusted? Last written 03/05/2024 Last OV 04/02/2024 Upcoming appt = none

## 2024-07-03 ENCOUNTER — Ambulatory Visit: Admitting: Sports Medicine

## 2024-07-03 ENCOUNTER — Encounter

## 2024-07-09 ENCOUNTER — Ambulatory Visit

## 2024-07-09 DIAGNOSIS — M6281 Muscle weakness (generalized): Secondary | ICD-10-CM

## 2024-07-09 DIAGNOSIS — R600 Localized edema: Secondary | ICD-10-CM

## 2024-07-09 DIAGNOSIS — R2689 Other abnormalities of gait and mobility: Secondary | ICD-10-CM

## 2024-07-09 DIAGNOSIS — M25562 Pain in left knee: Secondary | ICD-10-CM

## 2024-07-09 NOTE — Therapy (Signed)
 OUTPATIENT PHYSICAL THERAPY LOWER EXTREMITY TREATMENT   Patient Name: Whitney Turner MRN: [id number] DOB:1975-12-05, 48 y.o., female Today's Date: 07/09/2024  END OF SESSION:  PT End of Session - 07/09/24 0932     Visit Number 8    Number of Visits 25    Date for PT Re-Evaluation 08/04/24    Authorization Type [employer]    PT Start Time 0932    PT Stop Time 1015    PT Time Calculation (min) 43 min    Activity Tolerance Patient tolerated treatment well    Behavior During Therapy [facility] for tasks assessed/performed                Past Medical History:  Diagnosis Date   Allergy    Heart murmur    Herpes    Past Surgical History:  Procedure Laterality Date   HERNIA REPAIR  1982   KNEE SURGERY  08/2011   TONSILECTOMY/ADENOIDECTOMY WITH MYRINGOTOMY  2000   Patient Active Problem List   Diagnosis Date Noted   Abnormal weight gain 03/05/2024   Chronic left hip pain 02/27/2024   Tibialis posterior tendinitis, left 03/10/2023   Mouth ulcer 03/08/2023   Sore throat 03/08/2023   Poor concentration 12/30/2020   Hyperactive behavior 12/30/2020   Chronic left shoulder pain 11/17/2020   Elevated LDL cholesterol level 12/24/2019   Right lower quadrant pain 12/24/2019   Right upper quadrant pain 12/24/2019   Effusion of right knee 06/25/2019   Hemorrhoids 12/04/2018   CIN I (cervical intraepithelial neoplasia I) 04/03/2018   Lateral epicondylitis of left elbow 02/15/2018   Left hand pain 02/23/2016   History of repair of ACL of Left knee 01/26/2016   Plantar fasciitis, bilateral 12/29/2015   Genital herpes 12/01/2012   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 Was getting off a ladder yesterday and had pain when she did that, but it feels ok today. Had a massage and her glute feels better.   PERTINENT HISTORY: left knee arthroscopy with medial meniscectomy and revision ACL reconstruction with allograft 05/07/24 Previous LT ACLR  PAIN:  Are you having pain?No  PRECAUTIONS: Other: see protocol    WEIGHT BEARING RESTRICTIONS: Yes LLE WBAT in knee immobilizer   FALLS:  Has patient fallen in last 6 months? No  LIVING ENVIRONMENT: Lives with: lives with their family Lives in: House/apartment Stairs: Yes: Internal: flight steps; on right going up and External: 3 steps; none Has following equipment at home: Crutches, knee immobilizer  OCCUPATION: softball umpire    PATIENT GOALS: full range of motion and be able to jog.   NEXT MD VISIT: October 2025   OBJECTIVE:  Note: Objective measures were completed at Evaluation unless otherwise noted.  DIAGNOSTIC FINDINGS: IMPRESSION: 1. Small vertically oriented tear extending through the superior and inferior articular surfaces of the free edge of the root of the posterior horn of the medial meniscus. Vertically oriented tear extending through the inferior articular surface of the peripheral third of the  meniscal triangle of the more medial aspect of the posterior horn of the medial meniscus. Tiny horizontal linear tear extending through the superior articular surface and the peripheral wall of the mid to anterior aspect of the body of the medial meniscus. 2. Full-thickness cartilage defect within the inferior aspect of the medial trochlea with subchondral cystic change. 3. Full-thickness cartilage loss within the mid transverse, posterior weight-bearing lateral  femoral condyle extending into the posterior nonweightbearing lateral femoral condyle. Focal defect within the adjacent posterolateral tibial plateau cartilage. 4. At the distal aspect of the tibial tunnel for the ACL reconstruction, there is mild fluid bright signal bordering the medial cortex of the proximal tibial metaphysis. There is also mild-to-moderate marrow edema surrounding the distal tibial tunnel in this region. This may represent mild stress reaction.  PATIENT SURVEYS:  LEFS: 12/80    SENSATION: Not tested  EDEMA:  Moderate swelling about Lt knee     PALPATION: Not assessed   LOWER EXTREMITY ROM:  Active ROM Right eval Left eval Left AROM 05/17/24 05/28/24 Left  06/21/24 Left   Hip flexion       Hip extension       Hip abduction       Hip adduction       Hip internal rotation       Hip external rotation       Knee flexion  45 120 120 130  Knee extension  Lacking 5 Lacking 5 Lacking 3 1  Ankle dorsiflexion       Ankle plantarflexion       Ankle inversion       Ankle eversion        (Blank rows = not tested)  LOWER EXTREMITY MMT:   MMT Right eval Left eval  Hip flexion    Hip extension    Hip abduction    Hip adduction    Hip internal rotation    Hip external rotation    Knee flexion  deferred  Knee extension  Deferred   Ankle dorsiflexion    Ankle plantarflexion    Ankle inversion    Ankle eversion     (Blank rows = not tested)    FUNCTIONAL TESTS:  Not assessed   GAIT: Distance walked: 10 ft  Assistive device utilized: Crutches Level of assistance: Modified independence Comments: NWB LLE in knee immobilizer  [facility] Adult PT Treatment:                                                DATE: 07/09/24 Therapeutic Exercise: Recumbent bike level 3 x 5 minutes  HS curl on physioball 2 x 10  Manual Therapy: IASTM Lt quadriceps, IT band  Neuromuscular re-ed: Opposite touchdown kettlebell 2 x 10  Therapeutic Activity: Leg press 2 x 10 @ 95  lbs  Squats with mirror 2 x 10  Forward step downs 2 x 10; 4 inch     [facility] Adult PT Treatment:                                                DATE: 06/28/24 Therapeutic Exercise: Recumbent bike level 3 x 5 minutes  Manual Therapy: IASTM Lt IT band, quadriceps Neuromuscular re-ed: Lateral band walk  green band at shins 2 sets d/b x 15 ft  Clamshells 2 x 10 black band  Therapeutic Activity: Leg press x 10 @ 115 lbs, x 10 @ 120 lbs Lateral step down 4 inch 2 x 10  Squat to table 2 x 10   Self Care: Decrease volume of gym activity  Protocol review    [facility] Adult PT Treatment:                                                DATE: 06/21/24 Therapeutic Exercise: Recumbent bike level 2 x 5 minutes  Manual Therapy: Bridge with march 2 x 10  Neuromuscular re-ed: STM Lt IT band, quadriceps Therapeutic Activity: Squat to table x 10 Wall squat with physioball 2 x 10  Deadlift 10 lbs 2 x 10     [facility] Adult PT Treatment:                                                DATE: 06/06/24  Manual Therapy: IASTM LT quadriceps, IT band  Neuromuscular re-ed: 3 way hip on airex x 10 each  Hip bridge on physioball 2 x 10  Therapeutic Activity: Mini wall squats 2 x 10  Lateral step down 2 x 10; 4 inch step  Mini squat with BUE support 2 x 10    PATIENT EDUCATION:  Education details: HEP update Person educated: Patient Education method: Explanation, demo, cues, handout Education comprehension: verbalized understanding, returned demo, cues   HOME EXERCISE PROGRAM: Access Code: [email or web address] URL: [email or web address] Date: 07/09/2024 Prepared by: Lucie Meeter  Exercises - Sidelying Hip Abduction  - 1 x daily - 7 x weekly - 3 sets - 10 reps - 3-5 sec  hold - Prone Hip Extension  - 2 x daily - 7 x weekly - 1 sets - 10 reps - 3 sec  hold - Active Straight Leg Raise with Quad Set  - 1 x daily - 7 x weekly - 2 sets - 10 reps - Supine Hamstring Stretch with Strap  - 1 x daily - 7 x  weekly - 3 sets - 30 sec  hold - Standing Single Leg Heel Raise  - 1 x daily - 7 x weekly - 2 sets - 10 reps - Kettlebell Deadlift  - 1 x daily - 7 x weekly - 2 sets - 10 reps - Side Stepping with Resistance at Ankles  - 1 x daily - 7 x weekly - 2 sets - 10 reps - Clamshell with Resistance  - 1 x daily - 7 x weekly - 2 sets - 10 reps - Squat  - 1 x daily - 7 x weekly - 2 sets - 10 reps - Supine Hamstring Curl on Swiss Ball  - 1 x daily - 7 x weekly - 2 sets - 10 reps - Forward Step Down  - 1 x daily - 7 x weekly - 2 sets - 10 reps  ASSESSMENT:  CLINICAL IMPRESSION: Patient reported initial tightness in Lt quad and Rt hip shift present with bodyweight squats. Following IASTM she reported improvement in quad tightness with squatting. With mirror for visual feedback there are no signs of hip shift with squatting. Patient reports feelings of weakness  in the LLE with strengthening, but no onset of knee pain.   EVAL: Patient is a 48 y.o. female who was seen today for physical therapy evaluation and treatment for s/p Lt knee arthroscopy with medial meniscectomy and revision ACL reconstruction with allograft on 05/07/24. She demonstrates ROM, strength, gait and balance deficits that are consistent with her recent post-operative status. She will benefit from skilled PT to address the above stated deficits in order to return to optimal function.   OBJECTIVE IMPAIRMENTS: Abnormal gait, decreased activity tolerance, decreased balance, decreased endurance, decreased mobility, difficulty walking, decreased ROM, decreased strength, increased edema, impaired flexibility, improper body mechanics, and pain.    GOALS: Goals reviewed with patient? Yes  SHORT TERM GOALS: Target date: 06/21/2024   Patient will be independent and compliant with initial HEP.   Baseline: issued at eval Goal status: MET  2.  Patient will be able to perform LLE SLR x 10 without quad lag indicative of improved knee stability.   Baseline: unable 05/31/24: no quad lag with SLR Goal status: MET  3.  Patient will demonstrate at least 100 degrees of Lt knee flexion AROM to improve ability to complete sit to stand transfers.  Baseline: see above Goal status: MET  4.  Patient will demonstrate full Lt knee extension AROM to improve gait mechanics.  Baseline: see above Goal status: MET  5.  Patient will ambulate community distances without AD without Lt knee pain.  Baseline: WBAT LLE 06/21/24: community ambulator without pain  Goal status: MET    LONG TERM GOALS: Target date: 08/04/24  Patient will demonstrate 5/5 Lt knee strength to improve stability with curb/stair negotiation.  Baseline: not assessed.  Goal status: INITIAL  2.  Patient will demonstrate normalized squat mechanics without pain.  Baseline: unable Goal status: INITIAL  3.  Patient will score >/= 50/80 on the LEFS (MCID is 9) to signify clinically meaningful improvement in functional abilities.   Baseline: see above  Goal status: INITIAL  4.  Patient will demonstrate at least 120 degrees of Lt knee flexion AROM to improve ability to complete bending activity.  Baseline: see above Goal status: INITIAL  5.  Patient will be independent with advanced home program to progress strength phase of rehab independently.  Baseline: initial HEP issued  Goal status: INITIAL   PLAN:  PT FREQUENCY: 2x/week  PT DURATION: 12 weeks  PLANNED INTERVENTIONS: 97164- PT Re-evaluation, 97750- Physical Performance Testing, 97110-Therapeutic exercises, 97530- Therapeutic activity, [id number]- Neuromuscular re-education, 97535- Self Care, 02859- Manual therapy, [id number]- Gait training, [phone]- Electrical stimulation (unattended), [id number]- Electrical stimulation (manual), 97016- Vasopneumatic device, 20560 (1-2 muscles), 20561 (3+ muscles)- Dry Needling, Cryotherapy, and Moist heat  PLAN FOR NEXT SESSION: progress per protocol; game ready prn. Consider TPDN  quad,hamstring,glute   Lucie Meeter, PT, DPT, ATC 07/09/24 10:18 AM

## 2024-07-11 ENCOUNTER — Ambulatory Visit

## 2024-07-18 ENCOUNTER — Ambulatory Visit

## 2024-07-18 DIAGNOSIS — R600 Localized edema: Secondary | ICD-10-CM

## 2024-07-18 DIAGNOSIS — M25562 Pain in left knee: Secondary | ICD-10-CM

## 2024-07-18 DIAGNOSIS — M6281 Muscle weakness (generalized): Secondary | ICD-10-CM

## 2024-07-18 DIAGNOSIS — R2689 Other abnormalities of gait and mobility: Secondary | ICD-10-CM

## 2024-07-18 NOTE — Therapy (Signed)
 OUTPATIENT PHYSICAL THERAPY LOWER EXTREMITY TREATMENT   Patient Name: Tasfia Vasseur MRN: [id number] DOB:11/16/1976, 48 y.o., female Today's Date: 07/18/2024  END OF SESSION:  PT End of Session - 07/18/24 1101     Visit Number 9    Number of Visits 25    Date for PT Re-Evaluation 08/04/24    Authorization Type [employer]    PT Start Time 1101    PT Stop Time 1143    PT Time Calculation (min) 42 min    Activity Tolerance Patient tolerated treatment well    Behavior During Therapy [facility] for tasks assessed/performed                 Past Medical History:  Diagnosis Date   Allergy    Heart murmur    Herpes    Past Surgical History:  Procedure Laterality Date   HERNIA REPAIR  1982   KNEE SURGERY  08/2011   TONSILECTOMY/ADENOIDECTOMY WITH MYRINGOTOMY  2000   Patient Active Problem List   Diagnosis Date Noted   Abnormal weight gain 03/05/2024   Chronic left hip pain 02/27/2024   Tibialis posterior tendinitis, left 03/10/2023   Mouth ulcer 03/08/2023   Sore throat 03/08/2023   Poor concentration 12/30/2020   Hyperactive behavior 12/30/2020   Chronic left shoulder pain 11/17/2020   Elevated LDL cholesterol level 12/24/2019   Right lower quadrant pain 12/24/2019   Right upper quadrant pain 12/24/2019   Effusion of right knee 06/25/2019   Hemorrhoids 12/04/2018   CIN I (cervical intraepithelial neoplasia I) 04/03/2018   Lateral epicondylitis of left elbow 02/15/2018   Left hand pain 02/23/2016   History of repair of ACL of Left knee 01/26/2016   Plantar fasciitis, bilateral 12/29/2015   Genital herpes 12/01/2012   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/2012   Allergic rhinitis 12/01/2012   Seasonal allergies 12/01/2012    PCP: Antoniette Vermell LITTIE DEVONNA   REFERRING PROVIDER: Bonner Cristy DASEN, MD  REFERRING DIAG: left knee arthroscopy with medial meniscectomy and revision ACL reconstruction with allograft 6/16  THERAPY DIAG:  Acute pain of left knee  Muscle weakness (generalized)  Other  abnormalities of gait and mobility  Localized edema  Rationale for Evaluation and Treatment: Rehabilitation  ONSET DATE: 05/07/24  SUBJECTIVE:   SUBJECTIVE STATEMENT: Patient reports she is getting a little frustrated. With step downs she gets Lt hip pain unless she stabilizes with her hand. The leg press will hurt her anterior knee no matter what.   PERTINENT HISTORY: left knee arthroscopy with medial meniscectomy and revision ACL reconstruction with allograft 05/07/24 Previous LT ACLR  PAIN:  Are you having pain?No  PRECAUTIONS: Other: see protocol    WEIGHT BEARING RESTRICTIONS: Yes LLE WBAT in knee immobilizer   FALLS:  Has patient fallen in last 6 months? No  LIVING ENVIRONMENT: Lives with: lives with their family Lives in: House/apartment Stairs: Yes: Internal: flight steps; on right going up and External: 3 steps; none Has following equipment at home: Crutches, knee immobilizer  OCCUPATION: softball umpire    PATIENT GOALS: full range of motion and be able to jog.   NEXT MD VISIT: October 2025   OBJECTIVE:  Note: Objective measures were completed at Evaluation unless otherwise noted.  DIAGNOSTIC FINDINGS: IMPRESSION: 1. Small vertically oriented tear extending through the superior and inferior articular surfaces of the free edge of the root of the posterior horn of the medial meniscus. Vertically oriented tear extending through the inferior articular surface of the peripheral third of the meniscal triangle  of the more medial aspect of the posterior horn of the medial meniscus. Tiny horizontal linear tear extending through the superior articular surface and the peripheral wall of the mid to anterior aspect of the body of the medial meniscus. 2. Full-thickness cartilage defect within the inferior aspect of the medial trochlea with subchondral cystic change. 3. Full-thickness cartilage loss within the mid transverse, posterior weight-bearing lateral femoral  condyle extending into the posterior nonweightbearing lateral femoral condyle. Focal defect within the adjacent posterolateral tibial plateau cartilage. 4. At the distal aspect of the tibial tunnel for the ACL reconstruction, there is mild fluid bright signal bordering the medial cortex of the proximal tibial metaphysis. There is also mild-to-moderate marrow edema surrounding the distal tibial tunnel in this region. This may represent mild stress reaction.  PATIENT SURVEYS:  LEFS: 12/80    SENSATION: Not tested  EDEMA:  Moderate swelling about Lt knee     PALPATION: Not assessed   LOWER EXTREMITY ROM:  Active ROM Right eval Left eval Left AROM 05/17/24 05/28/24 Left  06/21/24 Left   Hip flexion       Hip extension       Hip abduction       Hip adduction       Hip internal rotation       Hip external rotation       Knee flexion  45 120 120 130  Knee extension  Lacking 5 Lacking 5 Lacking 3 1  Ankle dorsiflexion       Ankle plantarflexion       Ankle inversion       Ankle eversion        (Blank rows = not tested)  LOWER EXTREMITY MMT:   MMT Right eval Left eval  Hip flexion    Hip extension    Hip abduction    Hip adduction    Hip internal rotation    Hip external rotation    Knee flexion  deferred  Knee extension  Deferred   Ankle dorsiflexion    Ankle plantarflexion    Ankle inversion    Ankle eversion     (Blank rows = not tested)    FUNCTIONAL TESTS:  Not assessed   GAIT: Distance walked: 10 ft  Assistive device utilized: Crutches Level of assistance: Modified independence Comments: NWB LLE in knee immobilizer  [facility] Adult PT Treatment:                                                DATE: 07/18/24 Therapeutic Exercise: Recumbent bike level 3 x 5 minutes  Prone quad stretch 2 x 30 sec  HEP update  Manual Therapy: Rock tape I strip distal quad to patella tendon; I strip patella tendon   Therapeutic Activity: Squats x 10, x 10 post taping   Leg press 2 x 10; 90 lbs  Forward step down 4 inch x 10  Reverse lunge x 10   Self Care: Discussed avoiding gym activity that causes pain and being mindful of body mechanics with exercises.    [facility] Adult PT Treatment:                                                DATE:  07/09/24 Therapeutic Exercise: Recumbent bike level 3 x 5 minutes  HS curl on physioball 2 x 10  Manual Therapy: IASTM Lt quadriceps, IT band  Neuromuscular re-ed: Opposite touchdown kettlebell 2 x 10  Therapeutic Activity: Leg press 2 x 10 @ 95 lbs  Squats with mirror 2 x 10  Forward step downs 2 x 10; 4 inch     [facility] Adult PT Treatment:                                                DATE: 06/28/24 Therapeutic Exercise: Recumbent bike level 3 x 5 minutes  Manual Therapy: IASTM Lt IT band, quadriceps Neuromuscular re-ed: Lateral band walk green band at shins 2 sets d/b x 15 ft  Clamshells 2 x 10 black band  Therapeutic Activity: Leg press x 10 @ 115 lbs, x 10 @ 120 lbs Lateral step down 4 inch 2 x 10  Squat to table 2 x 10   Self Care: Decrease volume of gym activity  Protocol review    [facility] Adult PT Treatment:                                                DATE: 06/21/24 Therapeutic Exercise: Recumbent bike level 2 x 5 minutes  Manual Therapy: Bridge with march 2 x 10  Neuromuscular re-ed: STM Lt IT band, quadriceps Therapeutic Activity: Squat to table x 10 Wall squat with physioball 2 x 10  Deadlift 10 lbs 2 x 10     [facility] Adult PT Treatment:                                                DATE: 06/06/24  Manual Therapy: IASTM LT quadriceps, IT band  Neuromuscular re-ed: 3 way hip on airex x 10 each  Hip bridge on physioball 2 x 10  Therapeutic Activity: Mini wall squats 2 x 10  Lateral step down 2 x 10; 4 inch step  Mini squat with BUE support 2 x 10    PATIENT EDUCATION:  Education details: HEP update Person educated: Patient Education method: Explanation, demo, cues,  handout Education comprehension: verbalized understanding, returned demo, cues   HOME EXERCISE PROGRAM: Access Code: [email or web address] URL: [email or web address] Date: 07/18/2024 Prepared by: Lucie Meeter  Exercises - Sidelying Hip Abduction  - 1 x daily - 3 x weekly - 3 sets - 10 reps - 3-5 sec  hold - Active Straight Leg Raise with Quad Set  - 1 x daily - 3 x weekly - 2 sets - 10 reps - Supine Hamstring Stretch with Strap  - 1 x daily - 3 x weekly - 3 sets - 30 sec  hold - Standing Single Leg Heel Raise  - 1 x daily - 3 x weekly - 2 sets - 10 reps - Kettlebell Deadlift  - 1 x daily - 3 x weekly - 2 sets - 10 reps - Side Stepping with Resistance at Ankles  - 1 x daily - 3 x weekly - 2 sets - 10 reps - Clamshell with Resistance  - 1 x daily -  3 x weekly - 2 sets - 10 reps - Squat  - 1 x daily - 3 x weekly - 2 sets - 10 reps - Supine Hamstring Curl on Swiss Ball  - 1 x daily - 3 x weekly - 2 sets - 10 reps - Forward Step Down  - 1 x daily - 3 x weekly - 2 sets - 10 reps - Reverse Lunge  - 1 x daily - 3 x weekly - 2 sets - 10 reps  ASSESSMENT:  CLINICAL IMPRESSION: With bodyweight squats patient describes an ache in the Lt anterior knee during the descent and reports feelings that she does not trust the knee from a stability standpoint. We performed other exercises that she states has been on/off painful as part of her exercise routine (leg press and step downs). These activities did not create pain today, but does require cues for proper knee alignment. No pain with lunge activity with patient able to maintain proper form. Rock tape was applied to provide patellofemoral support with patient reporting less pain with squatting activity with tape donned. Lengthy discussion regarding avoiding painful activity at the gym and focusing on body mechanics with exercises with patient verbalizing understanding.   EVAL: Patient is a 48 y.o. female who was seen today for physical therapy  evaluation and treatment for s/p Lt knee arthroscopy with medial meniscectomy and revision ACL reconstruction with allograft on 05/07/24. She demonstrates ROM, strength, gait and balance deficits that are consistent with her recent post-operative status. She will benefit from skilled PT to address the above stated deficits in order to return to optimal function.   OBJECTIVE IMPAIRMENTS: Abnormal gait, decreased activity tolerance, decreased balance, decreased endurance, decreased mobility, difficulty walking, decreased ROM, decreased strength, increased edema, impaired flexibility, improper body mechanics, and pain.    GOALS: Goals reviewed with patient? Yes  SHORT TERM GOALS: Target date: 06/21/2024   Patient will be independent and compliant with initial HEP.   Baseline: issued at eval Goal status: MET  2.  Patient will be able to perform LLE SLR x 10 without quad lag indicative of improved knee stability.  Baseline: unable 05/31/24: no quad lag with SLR Goal status: MET  3.  Patient will demonstrate at least 100 degrees of Lt knee flexion AROM to improve ability to complete sit to stand transfers.  Baseline: see above Goal status: MET  4.  Patient will demonstrate full Lt knee extension AROM to improve gait mechanics.  Baseline: see above Goal status: MET  5.  Patient will ambulate community distances without AD without Lt knee pain.  Baseline: WBAT LLE 06/21/24: community ambulator without pain  Goal status: MET    LONG TERM GOALS: Target date: 08/04/24  Patient will demonstrate 5/5 Lt knee strength to improve stability with curb/stair negotiation.  Baseline: not assessed.  Goal status: INITIAL  2.  Patient will demonstrate normalized squat mechanics without pain.  Baseline: unable Goal status: INITIAL  3.  Patient will score >/= 50/80 on the LEFS (MCID is 9) to signify clinically meaningful improvement in functional abilities.   Baseline: see above  Goal status:  INITIAL  4.  Patient will demonstrate at least 120 degrees of Lt knee flexion AROM to improve ability to complete bending activity.  Baseline: see above Goal status: INITIAL  5.  Patient will be independent with advanced home program to progress strength phase of rehab independently.  Baseline: initial HEP issued  Goal status: INITIAL   PLAN:  PT FREQUENCY: 2x/week  PT DURATION: 12 weeks  PLANNED INTERVENTIONS: 97164- PT Re-evaluation, 97750- Physical Performance Testing, 97110-Therapeutic exercises, 97530- Therapeutic activity, [id number]- Neuromuscular re-education, 97535- Self Care, [id number]- Manual therapy, [id number]- Gait training, [id number]- Electrical stimulation (unattended), [id number]- Electrical stimulation (manual), [id number]- Vasopneumatic device, 20560 (1-2 muscles), 20561 (3+ muscles)- Dry Needling, Cryotherapy, and Moist heat  PLAN FOR NEXT SESSION: progress per protocol; game ready prn. Consider TPDN quad,hamstring,glute   Lucie Meeter, PT, DPT, ATC 07/18/24 11:47 AM

## 2024-07-24 ENCOUNTER — Encounter: Payer: Self-pay | Admitting: Sports Medicine

## 2024-07-30 ENCOUNTER — Ambulatory Visit: Attending: Orthopaedic Surgery

## 2024-07-30 DIAGNOSIS — M25562 Pain in left knee: Secondary | ICD-10-CM | POA: Insufficient documentation

## 2024-07-30 DIAGNOSIS — M6281 Muscle weakness (generalized): Secondary | ICD-10-CM | POA: Insufficient documentation

## 2024-07-30 DIAGNOSIS — R600 Localized edema: Secondary | ICD-10-CM | POA: Diagnosis present

## 2024-07-30 DIAGNOSIS — R2689 Other abnormalities of gait and mobility: Secondary | ICD-10-CM | POA: Insufficient documentation

## 2024-07-30 NOTE — Patient Instructions (Signed)

## 2024-07-30 NOTE — Therapy (Signed)
 OUTPATIENT PHYSICAL THERAPY LOWER EXTREMITY TREATMENT   Patient Name: Whitney Turner MRN: [id number] DOB:1976-07-15, 48 y.o., female Today's Date: 07/30/2024  END OF SESSION:  PT End of Session - 07/30/24 0851     Visit Number 10    Number of Visits 25    Date for PT Re-Evaluation 08/04/24    Authorization Type [employer]    PT Start Time 0850    PT Stop Time 0932    PT Time Calculation (min) 42 min    Activity Tolerance Patient tolerated treatment well    Behavior During Therapy [facility] for tasks assessed/performed                  Past Medical History:  Diagnosis Date   Allergy    Heart murmur    Herpes    Past Surgical History:  Procedure Laterality Date   HERNIA REPAIR  1982   KNEE SURGERY  08/2011   TONSILECTOMY/ADENOIDECTOMY WITH MYRINGOTOMY  2000   Patient Active Problem List   Diagnosis Date Noted   Abnormal weight gain 03/05/2024   Chronic left hip pain 02/27/2024   Tibialis posterior tendinitis, left 03/10/2023   Mouth ulcer 03/08/2023   Sore throat 03/08/2023   Poor concentration 12/30/2020   Hyperactive behavior 12/30/2020   Chronic left shoulder pain 11/17/2020   Elevated LDL cholesterol level 12/24/2019   Right lower quadrant pain 12/24/2019   Right upper quadrant pain 12/24/2019   Effusion of right knee 06/25/2019   Hemorrhoids 12/04/2018   CIN I (cervical intraepithelial neoplasia I) 04/03/2018   Lateral epicondylitis of left elbow 02/15/2018   Left hand pain 02/23/2016   History of repair of ACL of Left knee 01/26/2016   Plantar fasciitis, bilateral 12/29/2015   Genital herpes 12/01/2012   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/2012   Allergic rhinitis 12/01/2012   Seasonal allergies 12/01/2012    PCP: Antoniette Vermell LITTIE DEVONNA   REFERRING PROVIDER: Bonner Cristy DASEN, MD  REFERRING DIAG: left knee arthroscopy with medial meniscectomy and revision ACL reconstruction with allograft 6/16  THERAPY DIAG:  Acute pain of left knee  Muscle weakness (generalized)  Other  abnormalities of gait and mobility  Localized edema  Rationale for Evaluation and Treatment: Rehabilitation  ONSET DATE: 05/07/24  SUBJECTIVE:   SUBJECTIVE STATEMENT: Patient reports squatting is starting to feel better, but feels like the quad is tight. Will still have occasional anterior knee pain.   PERTINENT HISTORY: left knee arthroscopy with medial meniscectomy and revision ACL reconstruction with allograft 05/07/24 Previous LT ACLR  PAIN:  Are you having pain?No  PRECAUTIONS: Other: see protocol    WEIGHT BEARING RESTRICTIONS: Yes LLE WBAT in knee immobilizer   FALLS:  Has patient fallen in last 6 months? No  LIVING ENVIRONMENT: Lives with: lives with their family Lives in: House/apartment Stairs: Yes: Internal: flight steps; on right going up and External: 3 steps; none Has following equipment at home: Crutches, knee immobilizer  OCCUPATION: softball umpire    PATIENT GOALS: full range of motion and be able to jog.   NEXT MD VISIT: October 2025   OBJECTIVE:  Note: Objective measures were completed at Evaluation unless otherwise noted.  DIAGNOSTIC FINDINGS: IMPRESSION: 1. Small vertically oriented tear extending through the superior and inferior articular surfaces of the free edge of the root of the posterior horn of the medial meniscus. Vertically oriented tear extending through the inferior articular surface of the peripheral third of the meniscal triangle of the more medial aspect of the posterior horn of  the medial meniscus. Tiny horizontal linear tear extending through the superior articular surface and the peripheral wall of the mid to anterior aspect of the body of the medial meniscus. 2. Full-thickness cartilage defect within the inferior aspect of the medial trochlea with subchondral cystic change. 3. Full-thickness cartilage loss within the mid transverse, posterior weight-bearing lateral femoral condyle extending into the posterior  nonweightbearing lateral femoral condyle. Focal defect within the adjacent posterolateral tibial plateau cartilage. 4. At the distal aspect of the tibial tunnel for the ACL reconstruction, there is mild fluid bright signal bordering the medial cortex of the proximal tibial metaphysis. There is also mild-to-moderate marrow edema surrounding the distal tibial tunnel in this region. This may represent mild stress reaction.  PATIENT SURVEYS:  LEFS: 12/80    SENSATION: Not tested  EDEMA:  Moderate swelling about Lt knee     PALPATION: Not assessed   LOWER EXTREMITY ROM:  Active ROM Right eval Left eval Left AROM 05/17/24 05/28/24 Left  06/21/24 Left   Hip flexion       Hip extension       Hip abduction       Hip adduction       Hip internal rotation       Hip external rotation       Knee flexion  45 120 120 130  Knee extension  Lacking 5 Lacking 5 Lacking 3 1  Ankle dorsiflexion       Ankle plantarflexion       Ankle inversion       Ankle eversion        (Blank rows = not tested)  LOWER EXTREMITY MMT:   MMT Right eval Left eval  Hip flexion    Hip extension    Hip abduction    Hip adduction    Hip internal rotation    Hip external rotation    Knee flexion  deferred  Knee extension  Deferred   Ankle dorsiflexion    Ankle plantarflexion    Ankle inversion    Ankle eversion     (Blank rows = not tested)    FUNCTIONAL TESTS:  Not assessed   GAIT: Distance walked: 10 ft  Assistive device utilized: Crutches Level of assistance: Modified independence Comments: NWB LLE in knee immobilizer  [facility] Adult PT Treatment:                                                DATE: 07/30/24 Therapeutic Exercise: Recumbent bike level 3 x 5 minutes  SL bridge 2 x 10  Reviewed HEP Manual Therapy: Skilled palpation of trigger points Trigger Point Dry Needling  Initial Treatment: Pt instructed on Dry Needling rational, procedures, and possible side effects. Pt instructed  to expect mild to moderate muscle soreness later in the day and/or into the next day.  Pt instructed in methods to reduce muscle soreness. Pt instructed to continue prescribed HEP. Patient was educated on signs and symptoms of infection and other risk factors and advised to seek medical attention should they occur.  Patient verbalized understanding of these instructions and education.   Patient Verbal Consent Given: Yes Education Handout Provided: Yes Muscles Treated: lt quadriceps, Lt gluteals  Electrical Stimulation Performed: No Treatment Response/Outcome: twitch response elicited   Therapeutic Activity: Squats x 10  SL RDL x 10 each; 10 lbs   Self  Care: Discussed gym progression and activities to currently avoid.  Protocol reviewed    [facility] Adult PT Treatment:                                                DATE: 07/18/24 Therapeutic Exercise: Recumbent bike level 3 x 5 minutes  Prone quad stretch 2 x 30 sec  HEP update  Manual Therapy: Rock tape I strip distal quad to patella tendon; I strip patella tendon   Therapeutic Activity: Squats x 10, x 10 post taping  Leg press 2 x 10; 90 lbs  Forward step down 4 inch x 10  Reverse lunge x 10   Self Care: Discussed avoiding gym activity that causes pain and being mindful of body mechanics with exercises.    [facility] Adult PT Treatment:                                                DATE: 07/09/24 Therapeutic Exercise: Recumbent bike level 3 x 5 minutes  HS curl on physioball 2 x 10  Manual Therapy: IASTM Lt quadriceps, IT band  Neuromuscular re-ed: Opposite touchdown kettlebell 2 x 10  Therapeutic Activity: Leg press 2 x 10 @ 95 lbs  Squats with mirror 2 x 10  Forward step downs 2 x 10; 4 inch     [facility] Adult PT Treatment:                                                DATE: 06/28/24 Therapeutic Exercise: Recumbent bike level 3 x 5 minutes  Manual Therapy: IASTM Lt IT band, quadriceps Neuromuscular re-ed: Lateral band  walk green band at shins 2 sets d/b x 15 ft  Clamshells 2 x 10 black band  Therapeutic Activity: Leg press x 10 @ 115 lbs, x 10 @ 120 lbs Lateral step down 4 inch 2 x 10  Squat to table 2 x 10   Self Care: Decrease volume of gym activity  Protocol review    [facility] Adult PT Treatment:                                                DATE: 06/21/24 Therapeutic Exercise: Recumbent bike level 2 x 5 minutes  Manual Therapy: Bridge with march 2 x 10  Neuromuscular re-ed: STM Lt IT band, quadriceps Therapeutic Activity: Squat to table x 10 Wall squat with physioball 2 x 10  Deadlift 10 lbs 2 x 10     [facility] Adult PT Treatment:                                                DATE: 06/06/24  Manual Therapy: IASTM LT quadriceps, IT band  Neuromuscular re-ed: 3 way hip on airex x 10 each  Hip bridge on physioball 2 x 10  Therapeutic  Activity: Mini wall squats 2 x 10  Lateral step down 2 x 10; 4 inch step  Mini squat with BUE support 2 x 10    PATIENT EDUCATION:  Education details: HEP review; TPDN Person educated: Patient Education method: Explanation, demo, cues, handout Education comprehension: verbalized understanding, returned demo, cues   HOME EXERCISE PROGRAM: Access Code: [email or web address] URL: [email or web address] Date: 07/18/2024 Prepared by: Lucie Meeter  Exercises - Sidelying Hip Abduction  - 1 x daily - 3 x weekly - 3 sets - 10 reps - 3-5 sec  hold - Active Straight Leg Raise with Quad Set  - 1 x daily - 3 x weekly - 2 sets - 10 reps - Supine Hamstring Stretch with Strap  - 1 x daily - 3 x weekly - 3 sets - 30 sec  hold - Standing Single Leg Heel Raise  - 1 x daily - 3 x weekly - 2 sets - 10 reps - Kettlebell Deadlift  - 1 x daily - 3 x weekly - 2 sets - 10 reps - Side Stepping with Resistance at Ankles  - 1 x daily - 3 x weekly - 2 sets - 10 reps - Clamshell with Resistance  - 1 x daily - 3 x weekly - 2 sets - 10 reps - Squat  - 1 x daily - 3 x weekly - 2  sets - 10 reps - Supine Hamstring Curl on Swiss Ball  - 1 x daily - 3 x weekly - 2 sets - 10 reps - Forward Step Down  - 1 x daily - 3 x weekly - 2 sets - 10 reps - Reverse Lunge  - 1 x daily - 3 x weekly - 2 sets - 10 reps  ASSESSMENT:  CLINICAL IMPRESSION: TPDN performed to Lt quadriceps and gluteals with excellent twitch response elicited. Mild soreness reported in quad post-intervention. She demonstrates normalized squat mechanics without onset of knee pain. Introduced [employer] with patient requiring cues to maintain neutral pelvis.   EVAL: Patient is a 48 y.o. female who was seen today for physical therapy evaluation and treatment for s/p Lt knee arthroscopy with medial meniscectomy and revision ACL reconstruction with allograft on 05/07/24. She demonstrates ROM, strength, gait and balance deficits that are consistent with her recent post-operative status. She will benefit from skilled PT to address the above stated deficits in order to return to optimal function.   OBJECTIVE IMPAIRMENTS: Abnormal gait, decreased activity tolerance, decreased balance, decreased endurance, decreased mobility, difficulty walking, decreased ROM, decreased strength, increased edema, impaired flexibility, improper body mechanics, and pain.    GOALS: Goals reviewed with patient? Yes  SHORT TERM GOALS: Target date: 06/21/2024   Patient will be independent and compliant with initial HEP.   Baseline: issued at eval Goal status: MET  2.  Patient will be able to perform LLE SLR x 10 without quad lag indicative of improved knee stability.  Baseline: unable 05/31/24: no quad lag with SLR Goal status: MET  3.  Patient will demonstrate at least 100 degrees of Lt knee flexion AROM to improve ability to complete sit to stand transfers.  Baseline: see above Goal status: MET  4.  Patient will demonstrate full Lt knee extension AROM to improve gait mechanics.  Baseline: see above Goal status: MET  5.  Patient will  ambulate community distances without AD without Lt knee pain.  Baseline: WBAT LLE 06/21/24: community ambulator without pain  Goal status: MET    LONG TERM GOALS: Target date:  08/04/24  Patient will demonstrate 5/5 Lt knee strength to improve stability with curb/stair negotiation.  Baseline: not assessed.  Goal status: INITIAL  2.  Patient will demonstrate normalized squat mechanics without pain.  Baseline: unable Goal status: MET  3.  Patient will score >/= 50/80 on the LEFS (MCID is 9) to signify clinically meaningful improvement in functional abilities.   Baseline: see above  Goal status: INITIAL  4.  Patient will demonstrate at least 120 degrees of Lt knee flexion AROM to improve ability to complete bending activity.  Baseline: see above Goal status: INITIAL  5.  Patient will be independent with advanced home program to progress strength phase of rehab independently.  Baseline: initial HEP issued  Goal status: INITIAL   PLAN:  PT FREQUENCY: 2x/week  PT DURATION: 12 weeks  PLANNED INTERVENTIONS: 97164- PT Re-evaluation, 97750- Physical Performance Testing, 97110-Therapeutic exercises, 97530- Therapeutic activity, [id number]- Neuromuscular re-education, 97535- Self Care, [id number]- Manual therapy, [id number]- Gait training, [id number]- Electrical stimulation (unattended), [id number]- Electrical stimulation (manual), 97016- Vasopneumatic device, 20560 (1-2 muscles), 20561 (3+ muscles)- Dry Needling, Cryotherapy, and Moist heat  PLAN FOR NEXT SESSION: progress per protocol; game ready prn. TPDN response?  Jaysin Gayler, PT, DPT, ATC 07/30/24 10:36 AM

## 2024-08-07 ENCOUNTER — Ambulatory Visit

## 2024-08-08 ENCOUNTER — Ambulatory Visit

## 2024-08-08 NOTE — Therapy (Incomplete)
 OUTPATIENT PHYSICAL THERAPY LOWER EXTREMITY TREATMENT RE-CERTIFICATION   Patient Name: Whitney Turner MRN: [id number] DOB:[birth year], 48 y.o., female Today's Date: 08/08/2024  END OF SESSION:            Past Medical History:  Diagnosis Date   Allergy    Heart murmur    Herpes    Past Surgical History:  Procedure Laterality Date   HERNIA REPAIR  1982   KNEE SURGERY  08/2011   TONSILECTOMY/ADENOIDECTOMY WITH MYRINGOTOMY  2000   Patient Active Problem List   Diagnosis Date Noted   Abnormal weight gain 03/05/2024   Chronic left hip pain 02/27/2024   Tibialis posterior tendinitis, left 03/10/2023   Mouth ulcer 03/08/2023   Sore throat 03/08/2023   Poor concentration 12/30/2020   Hyperactive behavior 12/30/2020   Chronic left shoulder pain 11/17/2020   Elevated LDL cholesterol level 12/24/2019   Right lower quadrant pain 12/24/2019   Right upper quadrant pain 12/24/2019   Effusion of right knee 06/25/2019   Hemorrhoids 12/04/2018   CIN I (cervical intraepithelial neoplasia I) 04/03/2018   Lateral epicondylitis of left elbow 02/15/2018   Left hand pain 02/23/2016   History of repair of ACL of Left knee 01/26/2016   Plantar fasciitis, bilateral 12/29/2015   Genital herpes 12/01/2012   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/2012   Allergic rhinitis 12/01/2012   Seasonal allergies 12/01/2012    PCP: Antoniette Vermell CROME, PA-C   REFERRING PROVIDER: Bonner Cristy DASEN, MD  REFERRING DIAG: left knee arthroscopy with medial meniscectomy and revision ACL reconstruction with allograft 6/16  THERAPY DIAG:  No diagnosis found.  Rationale for Evaluation and Treatment: Rehabilitation  ONSET DATE: 05/07/24  SUBJECTIVE:   SUBJECTIVE STATEMENT: Patient reports squatting is starting to feel better, but feels like the quad is tight. Will still have occasional anterior knee pain.   PERTINENT HISTORY: left knee arthroscopy with medial meniscectomy and revision ACL reconstruction with allograft  05/07/24 Previous LT ACLR  PAIN:  Are you having pain?No  PRECAUTIONS: Other: see protocol    WEIGHT BEARING RESTRICTIONS: Yes LLE WBAT in knee immobilizer   FALLS:  Has patient fallen in last 6 months? No  LIVING ENVIRONMENT: Lives with: lives with their family Lives in: House/apartment Stairs: Yes: Internal: flight steps; on right going up and External: 3 steps; none Has following equipment at home: Crutches, knee immobilizer  OCCUPATION: softball umpire    PATIENT GOALS: full range of motion and be able to jog.   NEXT MD VISIT: October 2025   OBJECTIVE:  Note: Objective measures were completed at Evaluation unless otherwise noted.  DIAGNOSTIC FINDINGS: IMPRESSION: 1. Small vertically oriented tear extending through the superior and inferior articular surfaces of the free edge of the root of the posterior horn of the medial meniscus. Vertically oriented tear extending through the inferior articular surface of the peripheral third of the meniscal triangle of the more medial aspect of the posterior horn of the medial meniscus. Tiny horizontal linear tear extending through the superior articular surface and the peripheral wall of the mid to anterior aspect of the body of the medial meniscus. 2. Full-thickness cartilage defect within the inferior aspect of the medial trochlea with subchondral cystic change. 3. Full-thickness cartilage loss within the mid transverse, posterior weight-bearing lateral femoral condyle extending into the posterior nonweightbearing lateral femoral condyle. Focal defect within the adjacent posterolateral tibial plateau cartilage. 4. At the distal aspect of the tibial tunnel for the ACL reconstruction, there is mild fluid bright signal bordering the medial cortex  of the proximal tibial metaphysis. There is also mild-to-moderate marrow edema surrounding the distal tibial tunnel in this region. This may represent mild stress reaction.  PATIENT  SURVEYS:  LEFS: 12/80    SENSATION: Not tested  EDEMA:  Moderate swelling about Lt knee     PALPATION: Not assessed   LOWER EXTREMITY ROM:  Active ROM Right eval Left eval Left AROM 05/17/24 05/28/24 Left  06/21/24 Left   Hip flexion       Hip extension       Hip abduction       Hip adduction       Hip internal rotation       Hip external rotation       Knee flexion  45 120 120 130  Knee extension  Lacking 5 Lacking 5 Lacking 3 1  Ankle dorsiflexion       Ankle plantarflexion       Ankle inversion       Ankle eversion        (Blank rows = not tested)  LOWER EXTREMITY MMT:   MMT Right eval Left eval  Hip flexion    Hip extension    Hip abduction    Hip adduction    Hip internal rotation    Hip external rotation    Knee flexion  deferred  Knee extension  Deferred   Ankle dorsiflexion    Ankle plantarflexion    Ankle inversion    Ankle eversion     (Blank rows = not tested)    FUNCTIONAL TESTS:  Not assessed   GAIT: Distance walked: 10 ft  Assistive device utilized: Crutches Level of assistance: Modified independence Comments: NWB LLE in knee immobilizer  [facility] Adult PT Treatment:                                                DATE: 07/30/24 Therapeutic Exercise: Recumbent bike level 3 x 5 minutes  SL bridge 2 x 10  Reviewed HEP Manual Therapy: Skilled palpation of trigger points Trigger Point Dry Needling  Initial Treatment: Pt instructed on Dry Needling rational, procedures, and possible side effects. Pt instructed to expect mild to moderate muscle soreness later in the day and/or into the next day.  Pt instructed in methods to reduce muscle soreness. Pt instructed to continue prescribed HEP. Patient was educated on signs and symptoms of infection and other risk factors and advised to seek medical attention should they occur.  Patient verbalized understanding of these instructions and education.   Patient Verbal Consent Given: Yes Education  Handout Provided: Yes Muscles Treated: lt quadriceps, Lt gluteals  Electrical Stimulation Performed: No Treatment Response/Outcome: twitch response elicited   Therapeutic Activity: Squats x 10  SL RDL x 10 each; 10 lbs   Self Care: Discussed gym progression and activities to currently avoid.  Protocol reviewed    [facility] Adult PT Treatment:                                                DATE: 07/18/24 Therapeutic Exercise: Recumbent bike level 3 x 5 minutes  Prone quad stretch 2 x 30 sec  HEP update  Manual Therapy: Rock tape I strip distal quad to patella  tendon; I strip patella tendon   Therapeutic Activity: Squats x 10, x 10 post taping  Leg press 2 x 10; 90 lbs  Forward step down 4 inch x 10  Reverse lunge x 10   Self Care: Discussed avoiding gym activity that causes pain and being mindful of body mechanics with exercises.    [facility] Adult PT Treatment:                                                DATE: 07/09/24 Therapeutic Exercise: Recumbent bike level 3 x 5 minutes  HS curl on physioball 2 x 10  Manual Therapy: IASTM Lt quadriceps, IT band  Neuromuscular re-ed: Opposite touchdown kettlebell 2 x 10  Therapeutic Activity: Leg press 2 x 10 @ 95 lbs  Squats with mirror 2 x 10  Forward step downs 2 x 10; 4 inch     [facility] Adult PT Treatment:                                                DATE: 06/28/24 Therapeutic Exercise: Recumbent bike level 3 x 5 minutes  Manual Therapy: IASTM Lt IT band, quadriceps Neuromuscular re-ed: Lateral band walk green band at shins 2 sets d/b x 15 ft  Clamshells 2 x 10 black band  Therapeutic Activity: Leg press x 10 @ 115 lbs, x 10 @ 120 lbs Lateral step down 4 inch 2 x 10  Squat to table 2 x 10   Self Care: Decrease volume of gym activity  Protocol review    [facility] Adult PT Treatment:                                                DATE: 06/21/24 Therapeutic Exercise: Recumbent bike level 2 x 5 minutes  Manual Therapy: Bridge  with march 2 x 10  Neuromuscular re-ed: STM Lt IT band, quadriceps Therapeutic Activity: Squat to table x 10 Wall squat with physioball 2 x 10  Deadlift 10 lbs 2 x 10     [facility] Adult PT Treatment:                                                DATE: 06/06/24  Manual Therapy: IASTM LT quadriceps, IT band  Neuromuscular re-ed: 3 way hip on airex x 10 each  Hip bridge on physioball 2 x 10  Therapeutic Activity: Mini wall squats 2 x 10  Lateral step down 2 x 10; 4 inch step  Mini squat with BUE support 2 x 10    PATIENT EDUCATION:  Education details: HEP review; TPDN Person educated: Patient Education method: Explanation, demo, cues, handout Education comprehension: verbalized understanding, returned demo, cues   HOME EXERCISE PROGRAM: Access Code: [email or web address] URL: [email or web address] Date: 07/18/2024 Prepared by: Lucie Meeter  Exercises - Sidelying Hip Abduction  - 1 x daily - 3 x weekly - 3 sets - 10 reps - 3-5 sec  hold - Active Straight Leg  Raise with Quad Set  - 1 x daily - 3 x weekly - 2 sets - 10 reps - Supine Hamstring Stretch with Strap  - 1 x daily - 3 x weekly - 3 sets - 30 sec  hold - Standing Single Leg Heel Raise  - 1 x daily - 3 x weekly - 2 sets - 10 reps - Kettlebell Deadlift  - 1 x daily - 3 x weekly - 2 sets - 10 reps - Side Stepping with Resistance at Ankles  - 1 x daily - 3 x weekly - 2 sets - 10 reps - Clamshell with Resistance  - 1 x daily - 3 x weekly - 2 sets - 10 reps - Squat  - 1 x daily - 3 x weekly - 2 sets - 10 reps - Supine Hamstring Curl on Swiss Ball  - 1 x daily - 3 x weekly - 2 sets - 10 reps - Forward Step Down  - 1 x daily - 3 x weekly - 2 sets - 10 reps - Reverse Lunge  - 1 x daily - 3 x weekly - 2 sets - 10 reps  ASSESSMENT:  CLINICAL IMPRESSION: TPDN performed to Lt quadriceps and gluteals with excellent twitch response elicited. Mild soreness reported in quad post-intervention. She demonstrates normalized squat  mechanics without onset of knee pain. Introduced [employer] with patient requiring cues to maintain neutral pelvis.   EVAL: Patient is a 48 y.o. female who was seen today for physical therapy evaluation and treatment for s/p Lt knee arthroscopy with medial meniscectomy and revision ACL reconstruction with allograft on 05/07/24. She demonstrates ROM, strength, gait and balance deficits that are consistent with her recent post-operative status. She will benefit from skilled PT to address the above stated deficits in order to return to optimal function.   OBJECTIVE IMPAIRMENTS: Abnormal gait, decreased activity tolerance, decreased balance, decreased endurance, decreased mobility, difficulty walking, decreased ROM, decreased strength, increased edema, impaired flexibility, improper body mechanics, and pain.    GOALS: Goals reviewed with patient? Yes  SHORT TERM GOALS: Target date: 06/21/2024   Patient will be independent and compliant with initial HEP.   Baseline: issued at eval Goal status: MET  2.  Patient will be able to perform LLE SLR x 10 without quad lag indicative of improved knee stability.  Baseline: unable 05/31/24: no quad lag with SLR Goal status: MET  3.  Patient will demonstrate at least 100 degrees of Lt knee flexion AROM to improve ability to complete sit to stand transfers.  Baseline: see above Goal status: MET  4.  Patient will demonstrate full Lt knee extension AROM to improve gait mechanics.  Baseline: see above Goal status: MET  5.  Patient will ambulate community distances without AD without Lt knee pain.  Baseline: WBAT LLE 06/21/24: community ambulator without pain  Goal status: MET    LONG TERM GOALS: Target date: 08/04/24  Patient will demonstrate 5/5 Lt knee strength to improve stability with curb/stair negotiation.  Baseline: not assessed.  Goal status: INITIAL  2.  Patient will demonstrate normalized squat mechanics without pain.  Baseline: unable Goal  status: MET  3.  Patient will score >/= 50/80 on the LEFS (MCID is 9) to signify clinically meaningful improvement in functional abilities.   Baseline: see above  Goal status: INITIAL  4.  Patient will demonstrate at least 120 degrees of Lt knee flexion AROM to improve ability to complete bending activity.  Baseline: see above Goal status:  INITIAL  5.  Patient will be independent with advanced home program to progress strength phase of rehab independently.  Baseline: initial HEP issued  Goal status: INITIAL   PLAN:  PT FREQUENCY: 2x/week  PT DURATION: 12 weeks  PLANNED INTERVENTIONS: 97164- PT Re-evaluation, 97750- Physical Performance Testing, 97110-Therapeutic exercises, 97530- Therapeutic activity, [id number]- Neuromuscular re-education, 97535- Self Care, [id number]- Manual therapy, [id number]- Gait training, [phone]- Electrical stimulation (unattended), [id number]- Electrical stimulation (manual), 97016- Vasopneumatic device, 20560 (1-2 muscles), 20561 (3+ muscles)- Dry Needling, Cryotherapy, and Moist heat  PLAN FOR NEXT SESSION: progress per protocol; game ready prn. TPDN response?  Nazar Kuan, PT, DPT, ATC 08/08/24 7:08 AM

## 2024-08-10 ENCOUNTER — Other Ambulatory Visit: Payer: Self-pay

## 2024-08-10 DIAGNOSIS — M7542 Impingement syndrome of left shoulder: Secondary | ICD-10-CM

## 2024-08-10 MED ORDER — MELOXICAM 15 MG PO TABS: ORAL_TABLET | ORAL | 0 refills | Status: DC

## 2024-08-14 ENCOUNTER — Ambulatory Visit: Payer: Self-pay

## 2024-08-14 DIAGNOSIS — R2689 Other abnormalities of gait and mobility: Secondary | ICD-10-CM

## 2024-08-14 DIAGNOSIS — M6281 Muscle weakness (generalized): Secondary | ICD-10-CM

## 2024-08-14 DIAGNOSIS — M25562 Pain in left knee: Secondary | ICD-10-CM | POA: Diagnosis not present

## 2024-08-14 DIAGNOSIS — R600 Localized edema: Secondary | ICD-10-CM

## 2024-08-14 NOTE — Therapy (Signed)
 OUTPATIENT PHYSICAL THERAPY LOWER EXTREMITY TREATMENT RE-CERTIFICATION PHYSICAL THERAPY DISCHARGE SUMMARY  Visits from Start of Care: 11  Current functional level related to goals / functional outcomes: All goals met   Remaining deficits: Intermittent Lt knee pain Not cleared for running, jumping, plyometrics   Education / Equipment: See education below    Patient agrees to discharge. Patient goals were met. Patient is being discharged due to independent with HEP.   Patient Name: Whitney Turner MRN: [id number] DOB:1976-05-24, 48 y.o., female Today's Date: 08/14/2024  END OF SESSION:  PT End of Session - 08/14/24 0847     Visit Number 11    Number of Visits 25    Date for Recertification  --   n/a d/c   Authorization Type [employer]    PT Start Time [phone]    PT Stop Time 0930    PT Time Calculation (min) 43 min    Activity Tolerance Patient tolerated treatment well    Behavior During Therapy [facility] for tasks assessed/performed                   Past Medical History:  Diagnosis Date   Allergy    Heart murmur    Herpes    Past Surgical History:  Procedure Laterality Date   HERNIA REPAIR  1982   KNEE SURGERY  08/2011   TONSILECTOMY/ADENOIDECTOMY WITH MYRINGOTOMY  2000   Patient Active Problem List   Diagnosis Date Noted   Abnormal weight gain 03/05/2024   Chronic left hip pain 02/27/2024   Tibialis posterior tendinitis, left 03/10/2023   Mouth ulcer 03/08/2023   Sore throat 03/08/2023   Poor concentration 12/30/2020   Hyperactive behavior 12/30/2020   Chronic left shoulder pain 11/17/2020   Elevated LDL cholesterol level 12/24/2019   Right lower quadrant pain 12/24/2019   Right upper quadrant pain 12/24/2019   Effusion of right knee 06/25/2019   Hemorrhoids 12/04/2018   CIN I (cervical intraepithelial neoplasia I) 04/03/2018   Lateral epicondylitis of left elbow 02/15/2018   Left hand pain 02/23/2016   History of repair of ACL of Left knee 01/26/2016    Plantar fasciitis, bilateral 12/29/2015   Genital herpes 12/01/2012   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/2012   Allergic rhinitis 12/01/2012   Seasonal allergies 12/01/2012    PCP: Antoniette Vermell LITTIE DEVONNA   REFERRING PROVIDER: Bonner Cristy DASEN, MD  REFERRING DIAG: left knee arthroscopy with medial meniscectomy and revision ACL reconstruction with allograft 6/16  THERAPY DIAG:  Acute pain of left knee  Muscle weakness (generalized)  Other abnormalities of gait and mobility  Localized edema  Rationale for Evaluation and Treatment: Rehabilitation  ONSET DATE: 05/07/24  SUBJECTIVE:   SUBJECTIVE STATEMENT: Patient reports the knee was sore and tender 2 days after last session that lasted for about 3 days and then began to feel better. She backed off on cycling, foam rolling, and PT exercises. The knee feels pretty good right now, without pain. She is on her feet most days for work and does get swelling in the knee. Has f/u next month with Dr. Cristy.   PERTINENT HISTORY: left knee arthroscopy with medial meniscectomy and revision ACL reconstruction with allograft 05/07/24 Previous LT ACLR  PAIN:  Are you having pain?No  PRECAUTIONS: Other: see protocol    WEIGHT BEARING RESTRICTIONS: Yes LLE WBAT in knee immobilizer   FALLS:  Has patient fallen in last 6 months? No  LIVING ENVIRONMENT: Lives with: lives with their family Lives in: House/apartment Stairs: Yes: Internal: flight  steps; on right going up and External: 3 steps; none Has following equipment at home: Crutches, knee immobilizer  OCCUPATION: softball umpire    PATIENT GOALS: full range of motion and be able to jog.   NEXT MD VISIT: October 2025   OBJECTIVE:  Note: Objective measures were completed at Evaluation unless otherwise noted.  DIAGNOSTIC FINDINGS: IMPRESSION: 1. Small vertically oriented tear extending through the superior and inferior articular surfaces of the free edge of the root of the posterior horn of the  medial meniscus. Vertically oriented tear extending through the inferior articular surface of the peripheral third of the meniscal triangle of the more medial aspect of the posterior horn of the medial meniscus. Tiny horizontal linear tear extending through the superior articular surface and the peripheral wall of the mid to anterior aspect of the body of the medial meniscus. 2. Full-thickness cartilage defect within the inferior aspect of the medial trochlea with subchondral cystic change. 3. Full-thickness cartilage loss within the mid transverse, posterior weight-bearing lateral femoral condyle extending into the posterior nonweightbearing lateral femoral condyle. Focal defect within the adjacent posterolateral tibial plateau cartilage. 4. At the distal aspect of the tibial tunnel for the ACL reconstruction, there is mild fluid bright signal bordering the medial cortex of the proximal tibial metaphysis. There is also mild-to-moderate marrow edema surrounding the distal tibial tunnel in this region. This may represent mild stress reaction.  PATIENT SURVEYS:  LEFS: 12/80  08/14/24: LEFS 55/80    SENSATION: Not tested  EDEMA:  Moderate swelling about Lt knee     PALPATION: Not assessed   LOWER EXTREMITY ROM:  Active ROM Right eval Left eval Left AROM 05/17/24 05/28/24 Left  06/21/24 Left  08/14/24 Left   Hip flexion        Hip extension        Hip abduction        Hip adduction        Hip internal rotation        Hip external rotation        Knee flexion  45 120 120 130 135  Knee extension  Lacking 5 Lacking 5 Lacking 3 1 0  Ankle dorsiflexion        Ankle plantarflexion        Ankle inversion        Ankle eversion         (Blank rows = not tested)  LOWER EXTREMITY MMT:   MMT Right eval Left 08/14/24  Hip flexion    Hip extension    Hip abduction    Hip adduction    Hip internal rotation    Hip external rotation    Knee flexion  5  Knee extension  5    Ankle dorsiflexion    Ankle plantarflexion    Ankle inversion    Ankle eversion     (Blank rows = not tested)    FUNCTIONAL TESTS:  Not assessed   GAIT: Distance walked: 10 ft  Assistive device utilized: Crutches Level of assistance: Modified independence Comments: NWB LLE in knee immobilizer  [facility] Adult PT Treatment:                                                DATE: 08/14/24 Therapeutic Exercise: Reviewed and updated HEP discussing frequency, sets, reps, and ways to progress independently.  Discussed  gym progression post-op program and provided patient handout   Therapeutic Activity: Re-assessment to determine overall progress educating patient on progress towards goals   Self Care: Discussed proper loading as it relates to strength training Reviewed post-op protocol with handout provided.  Manual Therapy: Skilled palpation of trigger points Trigger Point Dry Needling  Subsequent Treatment: Instructions provided previously at initial dry needling treatment.   Patient Verbal Consent Given: Yes Education Handout Provided: Previously Provided Muscles Treated: Lt vastus lateralis, rectus femoris  Electrical Stimulation Performed: No Treatment Response/Outcome: twitch response elicited     [facility] Adult PT Treatment:                                                DATE: 07/30/24 Therapeutic Exercise: Recumbent bike level 3 x 5 minutes  SL bridge 2 x 10  Reviewed HEP Manual Therapy: Skilled palpation of trigger points Trigger Point Dry Needling  Initial Treatment: Pt instructed on Dry Needling rational, procedures, and possible side effects. Pt instructed to expect mild to moderate muscle soreness later in the day and/or into the next day.  Pt instructed in methods to reduce muscle soreness. Pt instructed to continue prescribed HEP. Patient was educated on signs and symptoms of infection and other risk factors and advised to seek medical attention should they occur.   Patient verbalized understanding of these instructions and education.   Patient Verbal Consent Given: Yes Education Handout Provided: Yes Muscles Treated: lt quadriceps, Lt gluteals  Electrical Stimulation Performed: No Treatment Response/Outcome: twitch response elicited   Therapeutic Activity: Squats x 10  SL RDL x 10 each; 10 lbs   Self Care: Discussed gym progression and activities to currently avoid.  Protocol reviewed    [facility] Adult PT Treatment:                                                DATE: 07/18/24 Therapeutic Exercise: Recumbent bike level 3 x 5 minutes  Prone quad stretch 2 x 30 sec  HEP update  Manual Therapy: Rock tape I strip distal quad to patella tendon; I strip patella tendon   Therapeutic Activity: Squats x 10, x 10 post taping  Leg press 2 x 10; 90 lbs  Forward step down 4 inch x 10  Reverse lunge x 10   Self Care: Discussed avoiding gym activity that causes pain and being mindful of body mechanics with exercises.    [facility] Adult PT Treatment:                                                DATE: 07/09/24 Therapeutic Exercise: Recumbent bike level 3 x 5 minutes  HS curl on physioball 2 x 10  Manual Therapy: IASTM Lt quadriceps, IT band  Neuromuscular re-ed: Opposite touchdown kettlebell 2 x 10  Therapeutic Activity: Leg press 2 x 10 @ 95 lbs  Squats with mirror 2 x 10  Forward step downs 2 x 10; 4 inch     [facility] Adult PT Treatment:  DATE: 06/28/24 Therapeutic Exercise: Recumbent bike level 3 x 5 minutes  Manual Therapy: IASTM Lt IT band, quadriceps Neuromuscular re-ed: Lateral band walk green band at shins 2 sets d/b x 15 ft  Clamshells 2 x 10 black band  Therapeutic Activity: Leg press x 10 @ 115 lbs, x 10 @ 120 lbs Lateral step down 4 inch 2 x 10  Squat to table 2 x 10   Self Care: Decrease volume of gym activity  Protocol review    [facility] Adult PT Treatment:                                                 DATE: 06/21/24 Therapeutic Exercise: Recumbent bike level 2 x 5 minutes  Manual Therapy: Bridge with march 2 x 10  Neuromuscular re-ed: STM Lt IT band, quadriceps Therapeutic Activity: Squat to table x 10 Wall squat with physioball 2 x 10  Deadlift 10 lbs 2 x 10     [facility] Adult PT Treatment:                                                DATE: 06/06/24  Manual Therapy: IASTM LT quadriceps, IT band  Neuromuscular re-ed: 3 way hip on airex x 10 each  Hip bridge on physioball 2 x 10  Therapeutic Activity: Mini wall squats 2 x 10  Lateral step down 2 x 10; 4 inch step  Mini squat with BUE support 2 x 10    PATIENT EDUCATION:  Education details: see treatment; d/c education  Person educated: Patient Education method: Programmer, multimedia, demo, handout Education comprehension: verbalized understanding, returned demo,  HOME EXERCISE PROGRAM: Access Code: [email or web address] URL: [email or web address] Date: 08/14/2024 Prepared by: Lucie Meeter  Exercises - Sidelying Hip Abduction  - 1 x daily - 3 x weekly - 3 sets - 10 reps - 3-5 sec  hold - Active Straight Leg Raise with Quad Set  - 1 x daily - 3 x weekly - 2 sets - 10 reps - Supine Hamstring Stretch with Strap  - 1 x daily - 3 x weekly - 3 sets - 30 sec  hold - Standing Single Leg Heel Raise  - 1 x daily - 3 x weekly - 2 sets - 10 reps - Side Stepping with Resistance at Ankles  - 1 x daily - 3 x weekly - 2 sets - 10 reps - Clamshell with Resistance  - 1 x daily - 3 x weekly - 2 sets - 10 reps - Supine Hamstring Curl on Swiss Ball  - 1 x daily - 3 x weekly - 2 sets - 10 reps - Forward Step Down  - 1 x daily - 3 x weekly - 2 sets - 10 reps - Single Leg Stance  - 1 x daily - 3 x weekly - 3 sets - 30 sec  hold  ASSESSMENT:  CLINICAL IMPRESSION: Jeilyn is 3 months s/p Lt revision ACLR and is making steady progress with post-operative protocol. She has full and pain free knee AROM and full strength with MMT. She  has reported intermittent knee pain throughout POC, though her pain is likely attributed to overactivity and we had lengthy discussion today regarding proper loading at the gym  with handouts provided.  She demonstrates independence with advanced home program that she will continue independently prior to progressing to next phase of rehabilitation which includes running at 4 months post-op. She is therefore appropriate for discharge at this time with patient in agreement with this plan.   EVAL: Patient is a 48 y.o. female who was seen today for physical therapy evaluation and treatment for s/p Lt knee arthroscopy with medial meniscectomy and revision ACL reconstruction with allograft on 05/07/24. She demonstrates ROM, strength, gait and balance deficits that are consistent with her recent post-operative status. She will benefit from skilled PT to address the above stated deficits in order to return to optimal function.   OBJECTIVE IMPAIRMENTS: Abnormal gait, decreased activity tolerance, decreased balance, decreased endurance, decreased mobility, difficulty walking, decreased ROM, decreased strength, increased edema, impaired flexibility, improper body mechanics, and pain.    GOALS: Goals reviewed with patient? Yes  SHORT TERM GOALS: Target date: 06/21/2024   Patient will be independent and compliant with initial HEP.   Baseline: issued at eval Goal status: MET  2.  Patient will be able to perform LLE SLR x 10 without quad lag indicative of improved knee stability.  Baseline: unable 05/31/24: no quad lag with SLR Goal status: MET  3.  Patient will demonstrate at least 100 degrees of Lt knee flexion AROM to improve ability to complete sit to stand transfers.  Baseline: see above Goal status: MET  4.  Patient will demonstrate full Lt knee extension AROM to improve gait mechanics.  Baseline: see above Goal status: MET  5.  Patient will ambulate community distances without AD without Lt knee  pain.  Baseline: WBAT LLE 06/21/24: community ambulator without pain  Goal status: MET    LONG TERM GOALS: Target date: 08/04/24  Patient will demonstrate 5/5 Lt knee strength to improve stability with curb/stair negotiation.  Baseline: not assessed.  Goal status: MET  2.  Patient will demonstrate normalized squat mechanics without pain.  Baseline: unable Goal status: MET  3.  Patient will score >/= 50/80 on the LEFS (MCID is 9) to signify clinically meaningful improvement in functional abilities.   Baseline: see above  Goal status: MET  4.  Patient will demonstrate at least 120 degrees of Lt knee flexion AROM to improve ability to complete bending activity.  Baseline: see above Goal status: MET  5.  Patient will be independent with advanced home program to progress strength phase of rehab independently.  Baseline: initial HEP issued  Goal status: MET   PLAN:  PT FREQUENCY: 2x/week  PT DURATION: 12 weeks  PLANNED INTERVENTIONS: [id number]- PT Re-evaluation, 97750- Physical Performance Testing, 97110-Therapeutic exercises, 97530- Therapeutic activity, [id number]- Neuromuscular re-education, 97535- Self Care, 02859- Manual therapy, [id number]- Gait training, [phone]- Electrical stimulation (unattended), [id number]- Electrical stimulation (manual), [id number]- Vasopneumatic device, 20560 (1-2 muscles), 20561 (3+ muscles)- Dry Needling, Cryotherapy, and Moist heat    Lucie Meeter, PT, DPT, ATC 08/14/24 12:54 PM

## 2024-08-17 ENCOUNTER — Telehealth: Payer: Self-pay | Admitting: Physician Assistant

## 2024-08-17 NOTE — Telephone Encounter (Signed)
 Copied from CRM #[id number]. Topic: Appointments - Scheduling Inquiry for Clinic >> Aug 17, 2024 10:55 AM Treva T wrote: Reason for CRM: Patient calling requesting to have TB placement appointment, for new job. Patient has form to be completed.   Patient can be reached at [phone].  Patient is aware of same day call back.

## 2024-08-21 NOTE — Telephone Encounter (Signed)
 Patient is scheduled for TB test on Wednesday and Friday to be read

## 2024-08-22 ENCOUNTER — Ambulatory Visit (INDEPENDENT_AMBULATORY_CARE_PROVIDER_SITE_OTHER)

## 2024-08-22 VITALS — BP 98/71 | HR 68 | Temp 98.0°F | Resp 18 | Ht 63.0 in

## 2024-08-22 DIAGNOSIS — Z111 Encounter for screening for respiratory tuberculosis: Secondary | ICD-10-CM | POA: Diagnosis not present

## 2024-08-22 NOTE — Progress Notes (Signed)
 Pt here for  PPD placement. Denies SOB, CP, fever, or medication changes. Placed on LLF. Pt tolerated well without complications Pt advised to RTC within 48 to 72 hrs. For interpretation. Pt scheduled to return on 08/24/24.

## 2024-08-24 ENCOUNTER — Ambulatory Visit

## 2024-08-24 DIAGNOSIS — Z111 Encounter for screening for respiratory tuberculosis: Secondary | ICD-10-CM | POA: Diagnosis not present

## 2024-08-24 LAB — TB SKIN TEST
Induration: 0 mm
TB Skin Test: NEGATIVE

## 2024-08-24 NOTE — Progress Notes (Signed)
 Patient is here to have here Tb skin test read. Placed on 08/22/24. She denies fever, chills, headache, or medication issues. Patient results are negative. Documented in the chart and faxed to the request fax number at [phone]. Annabella Rigg, CMA

## 2024-09-11 NOTE — Therapy (Signed)
 OUTPATIENT PHYSICAL THERAPY LOWER EXTREMITY EVALUATION   Patient Name: Whitney Turner MRN: [id number] DOB:Jan 01, 1976, 48 y.o., female Today's Date: 09/13/2024  END OF SESSION:  PT End of Session - 09/12/24 1318     Visit Number 1    Number of Visits 9    Date for Recertification  11/10/24    Authorization Type [employer]    PT Start Time 1319    PT Stop Time 1400    PT Time Calculation (min) 41 min    Activity Tolerance Patient tolerated treatment well    Behavior During Therapy [facility] for tasks assessed/performed          Past Medical History:  Diagnosis Date   Allergy    Heart murmur    Herpes    Past Surgical History:  Procedure Laterality Date   HERNIA REPAIR  1982   KNEE SURGERY  08/2011   TONSILECTOMY/ADENOIDECTOMY WITH MYRINGOTOMY  2000   Patient Active Problem List   Diagnosis Date Noted   Abnormal weight gain 03/05/2024   Chronic left hip pain 02/27/2024   Tibialis posterior tendinitis, left 03/10/2023   Mouth ulcer 03/08/2023   Sore throat 03/08/2023   Poor concentration 12/30/2020   Hyperactive behavior 12/30/2020   Chronic left shoulder pain 11/17/2020   Elevated LDL cholesterol level 12/24/2019   Right lower quadrant pain 12/24/2019   Right upper quadrant pain 12/24/2019   Effusion of right knee 06/25/2019   Hemorrhoids 12/04/2018   CIN I (cervical intraepithelial neoplasia I) 04/03/2018   Lateral epicondylitis of left elbow 02/15/2018   Left hand pain 02/23/2016   History of repair of ACL of Left knee 01/26/2016   Plantar fasciitis, bilateral 12/29/2015   Genital herpes 12/01/2012   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 She reports overall the knee is feeling pretty good. Still will get soreness in the knee. Will also feel achy in the knee with particular activities at the gym (squatting) with continued reps. She had recent f/u with surgical PA and was instructed that she can begin jogging, continue with gym, and dry needling as needed. Does not experience pain with her normal, daily activities. She tried outdoor jogging for short distances with interval walking for a total of 10 minutes and her knee felt fine. She also reports intermittent Lt hip pain.   PERTINENT HISTORY: LT ACLR  05/07/24 PAIN:  Are you having pain? Yes: NPRS scale: 4 Pain location: Lt medial knee Pain description: sore Aggravating factors: overactivity Relieving factors: rest  PRECAUTIONS: Other: see protocol on file   RED FLAGS: None   WEIGHT BEARING RESTRICTIONS: No  FALLS:  Has patient fallen in last 6 months? No  LIVING ENVIRONMENT: Lives with: lives with their family Lives in: House/apartment Stairs: Yes: Internal: flight steps; on right going up and External: 3 steps; none Has following equipment at home: Crutches  OCCUPATION: umpire   PLOF: Independent  PATIENT GOALS: I need to run. Need to do sprints.   NEXT MD VISIT: January 2026  OBJECTIVE:  Note: Objective measures were completed at Evaluation unless otherwise noted.  DIAGNOSTIC FINDINGS: IMPRESSION: 1. Small vertically oriented tear extending through the superior and inferior articular surfaces of the free edge of the root of the posterior horn of the medial meniscus. Vertically oriented tear extending through the inferior articular surface of the peripheral third of the meniscal triangle of the more medial aspect of the posterior horn of the medial meniscus.  Tiny horizontal linear tear extending through the superior articular surface and the peripheral wall of the mid to anterior aspect of the body of the medial meniscus. 2. Full-thickness cartilage defect within the inferior aspect of the medial trochlea with subchondral cystic change. 3. Full-thickness cartilage loss within the mid transverse, posterior weight-bearing lateral femoral condyle extending into the posterior nonweightbearing lateral femoral condyle. Focal defect within the adjacent posterolateral tibial plateau cartilage. 4. At the distal aspect of the tibial tunnel for the ACL reconstruction, there is mild fluid bright signal bordering the medial cortex of the proximal tibial metaphysis. There is also mild-to-moderate marrow edema surrounding the distal tibial tunnel in this region. This may represent mild stress reaction.  PATIENT SURVEYS:  LEFS: 52/80  COGNITION: Overall cognitive status: Within functional limits for tasks assessed     SENSATION: Not tested  EDEMA:  Mild swelling about Lt knee     POSTURE: No Significant postural limitations  PALPATION: No assessed   LOWER EXTREMITY ROM:  Active ROM Right eval Left eval  Hip flexion    Hip extension    Hip abduction    Hip adduction    Hip internal rotation    Hip external rotation    Knee flexion  Full   Knee extension  Full   Ankle dorsiflexion    Ankle plantarflexion    Ankle inversion    Ankle eversion     (Blank rows = not tested)  LOWER EXTREMITY MMT:  MMT Right eval Left eval  Hip flexion 5 5  Hip extension 5 4  Hip abduction 4 4  Hip adduction 4 4  Hip internal rotation    Hip external rotation    Knee flexion 5 5  Knee extension 5 5  Ankle dorsiflexion    Ankle plantarflexion    Ankle inversion    Ankle eversion     (Blank rows = not tested)  FUNCTIONAL TESTS:  SL squat: valgus collapse LLE Bounding: rigid landing, valgus collapse LLE  GAIT: Distance walked: 10 ft   Assistive device utilized: None Level of assistance: Complete Independence Comments: no obvious gait abnormalities                                                                                                                                 [facility] Adult PT Treatment:  DATE: 09/12/24 Therapeutic Exercise: Demonstrated,performed and issued initial HEP.   Therapeutic Activity: Walk on treadmill 2.5 x 2 minutes, jog at 3.5 x 30 seconds, walk x 1 minute 2.5.   Self Care: Discussed introducing jog interval on level surface 20 minutes 1:2 jog to walk ratio  Reviewed protocol, current restrictions Discussed appropriate gym activity     PATIENT EDUCATION:  Education details: see treatment; POC Person educated: Patient Education method: Explanation, Demonstration, Tactile cues, Verbal cues, and Handouts Education comprehension: verbalized understanding, returned demonstration, verbal cues required, tactile cues required, and needs further education  HOME EXERCISE PROGRAM: Access Code: [email or web address] URL: [email or web address] Date: 09/12/2024 Prepared by: Lucie Meeter  Exercises - Supine Hamstring Stretch with Strap  - 1 x daily - 3 x weekly - 3 sets - 30 sec  hold - Standing Single Leg Heel Raise  - 1 x daily - 3 x weekly - 2 sets - 10 reps - Side Stepping with Resistance at Ankles  - 1 x daily - 3 x weekly - 2 sets - 10 reps - Clamshell with Resistance  - 1 x daily - 3 x weekly - 2 sets - 10 reps - Supine Hamstring Curl on Swiss Ball  - 1 x daily - 3 x weekly - 2 sets - 10 reps - Forward Step Down  - 1 x daily - 3 x weekly - 2 sets - 10 reps - Single Leg Stance  - 1 x daily - 3 x weekly - 3 sets - 30 sec  hold - Sidelying Hip Circles  - 1 x daily - 3 x weekly - 3 sets - 10 reps - Sidelying Hip Adduction  - 1 x daily - 3 x weekly - 3 sets - 10 reps  ASSESSMENT:  CLINICAL IMPRESSION: Patient is a 48 y.o. female who was seen  today for physical therapy evaluation and treatment for return to jogging and running s/p Lt ACRL on 05/07/24. She demonstrates full and pain free Lt knee AROM and mild hip weakness with MMT. Does exhibit LE weakness/balance deficits with SL tasks. She will benefit from skilled PT to address functional strength deficits, balance impairments, and safely progress jogging/sprinting as appropriate in order to optimize her function.    OBJECTIVE IMPAIRMENTS: decreased activity tolerance, decreased balance, decreased coordination, decreased endurance, decreased knowledge of condition, decreased strength, increased edema, increased fascial restrictions, impaired flexibility, improper body mechanics, and pain.   ACTIVITY LIMITATIONS: carrying, lifting, standing, squatting, and locomotion level  PARTICIPATION LIMITATIONS: community activity, occupation, and recreational activitiy  PERSONAL FACTORS: Age, Fitness, Profession, Time since onset of injury/illness/exacerbation, and 1 comorbidity: PSH above are also affecting patient's functional outcome.   REHAB POTENTIAL: Good  CLINICAL DECISION MAKING: Stable/uncomplicated  EVALUATION COMPLEXITY: Low   GOALS: Goals reviewed with patient? Yes  SHORT TERM GOALS: Target date: 10/10/2024   Patient will be independent and compliant with initial HEP.   Baseline: initial HEP issued  Goal status: INITIAL  2.  Patient will demonstrate normalized mechanics with forward bounding to reduce stress on knee with running activity.  Baseline: see above Goal status: INITIAL  3.  Patient will demonstrate 5/5 hip strength to improve stability about the chain with running/jumping activity.  Baseline: see above Goal status: INITIAL   LONG TERM GOALS: Target date: 11/10/2024    Patient will score >/= 65/80 on the LEFS (MCID is 9) to signify clinically meaningful improvement in functional abilities.   Baseline: see above Goal status: INITIAL  2.  Patient  will be able to sprint at least 40 ft with minimal/no knee pain.  Baseline: unable  Goal status: INITIAL  3.   Patient will demonstrate normalized SL squat mechanics to improve knee stability.  Baseline: see above Goal status: INITIAL  4.  Patient will be able to complete 1 mile jog with minimal/no knee pain.  Baseline: unable  Goal status: INITIAL     PLAN:  PT FREQUENCY: 1x/week  PT DURATION: 8 weeks  PLANNED INTERVENTIONS: 97164- PT Re-evaluation, 97750- Physical Performance Testing, 97110-Therapeutic exercises, 97530- Therapeutic activity, [id number]- Neuromuscular re-education, 97535- Self Care, 02859- Manual therapy, [id number]- Gait training, [phone]- Aquatic Therapy, [phone]- Electrical stimulation (unattended), [phone]- Electrical stimulation (manual), [id number]- Vasopneumatic device, [id number]- Ionotophoresis 4mg /ml Dexamethasone, 20560 (1-2 muscles), 20561 (3+ muscles)- Dry Needling, Patient/Family education, Cryotherapy, and Moist heat  PLAN FOR NEXT SESSION: progress per protocol; TPDN to gluteals/quad/hamstring   Lucie Meeter, PT, DPT, ATC 09/13/24 8:35 AM

## 2024-09-12 ENCOUNTER — Ambulatory Visit: Attending: Orthopaedic Surgery

## 2024-09-12 ENCOUNTER — Other Ambulatory Visit: Payer: Self-pay

## 2024-09-12 DIAGNOSIS — M25562 Pain in left knee: Secondary | ICD-10-CM | POA: Insufficient documentation

## 2024-09-12 DIAGNOSIS — R600 Localized edema: Secondary | ICD-10-CM | POA: Insufficient documentation

## 2024-09-12 DIAGNOSIS — M6281 Muscle weakness (generalized): Secondary | ICD-10-CM | POA: Insufficient documentation

## 2024-09-12 DIAGNOSIS — G8929 Other chronic pain: Secondary | ICD-10-CM | POA: Insufficient documentation

## 2024-09-19 ENCOUNTER — Encounter: Payer: Self-pay | Admitting: Physical Therapy

## 2024-09-19 ENCOUNTER — Ambulatory Visit

## 2024-09-19 ENCOUNTER — Ambulatory Visit: Admitting: Physical Therapy

## 2024-09-19 DIAGNOSIS — M6281 Muscle weakness (generalized): Secondary | ICD-10-CM

## 2024-09-19 DIAGNOSIS — R600 Localized edema: Secondary | ICD-10-CM

## 2024-09-19 DIAGNOSIS — G8929 Other chronic pain: Secondary | ICD-10-CM

## 2024-09-19 DIAGNOSIS — M25562 Pain in left knee: Secondary | ICD-10-CM | POA: Diagnosis not present

## 2024-09-19 NOTE — Therapy (Signed)
 OUTPATIENT PHYSICAL THERAPY LOWER EXTREMITY TREATMENT   Patient Name: Whitney Turner MRN: [id number] DOB:Apr 22, 1976, 48 y.o., female Today's Date: 09/19/2024  END OF SESSION:  PT End of Session - 09/19/24 1612     Visit Number 2    Number of Visits 9    Date for Recertification  11/10/24    Authorization Type U[employer]    PT Start Time 1530    PT Stop Time 1609    PT Time Calculation (min) 39 min    Activity Tolerance Patient tolerated treatment well    Behavior During Therapy [facility] for tasks assessed/performed           Past Medical History:  Diagnosis Date   Allergy    Heart murmur    Herpes    Past Surgical History:  Procedure Laterality Date   HERNIA REPAIR  1982   KNEE SURGERY  08/2011   TONSILECTOMY/ADENOIDECTOMY WITH MYRINGOTOMY  2000   Patient Active Problem List   Diagnosis Date Noted   Abnormal weight gain 03/05/2024   Chronic left hip pain 02/27/2024   Tibialis posterior tendinitis, left 03/10/2023   Mouth ulcer 03/08/2023   Sore throat 03/08/2023   Poor concentration 12/30/2020   Hyperactive behavior 12/30/2020   Chronic left shoulder pain 11/17/2020   Elevated LDL cholesterol level 12/24/2019   Right lower quadrant pain 12/24/2019   Right upper quadrant pain 12/24/2019   Effusion of right knee 06/25/2019   Hemorrhoids 12/04/2018   CIN I (cervical intraepithelial neoplasia I) 04/03/2018   Lateral epicondylitis of left elbow 02/15/2018   Left hand pain 02/23/2016   History of repair of ACL of Left knee 01/26/2016   Plantar fasciitis, bilateral 12/29/2015   Genital herpes 12/01/2012   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 She states that she has been doing well with jogging. She has some difficulty doing single leg squats and other exercises due to her Lt hip fatigue, weakness and pain  PERTINENT HISTORY: LT ACLR  05/07/24  Patient is 4 months s/p Lt ACLR with allograft with referral to evaluate and treat for run/jog progression. She reports overall the knee is feeling pretty good. Still will get soreness in the knee. Will also feel achy in the knee with particular activities at the gym (squatting) with continued reps. She had recent f/u with surgical PA and was instructed that she can begin jogging, continue with gym, and dry needling as needed. Does not experience pain with her normal, daily activities. She tried outdoor jogging for short distances with interval walking for a total of 10 minutes and her knee felt fine. She also reports intermittent Lt hip pain.  PAIN:  Are you having pain? Yes: NPRS scale: 2 Pain location: Lt hip Pain description: sore Aggravating factors: overactivity, stretch, prolonged sitting Relieving factors: rest  PRECAUTIONS: Other: see protocol on file   RED FLAGS: None   WEIGHT BEARING RESTRICTIONS: No  FALLS:  Has patient fallen in last 6 months? No  LIVING ENVIRONMENT: Lives with: lives with their family Lives in: House/apartment  Stairs: Yes: Internal: flight steps; on right going up and External: 3 steps; none Has following equipment at home: Crutches  OCCUPATION: umpire   PLOF: Independent  PATIENT GOALS: I need to run. Need to do sprints.   NEXT MD VISIT: January 2026   OBJECTIVE:  Note: Objective measures were completed at Evaluation unless otherwise noted.  DIAGNOSTIC FINDINGS: IMPRESSION: 1. Small vertically oriented tear extending through the superior and inferior articular surfaces of the free edge of the  root of the posterior horn of the medial meniscus. Vertically oriented tear extending through the inferior articular surface of the peripheral third of the meniscal triangle of the more medial aspect of the posterior horn of the medial meniscus. Tiny horizontal linear tear extending through the superior articular surface and the peripheral wall of the mid to anterior aspect of the body of the medial meniscus. 2. Full-thickness cartilage defect within the inferior aspect of the medial trochlea with subchondral cystic change. 3. Full-thickness cartilage loss within the mid transverse, posterior weight-bearing lateral femoral condyle extending into the posterior nonweightbearing lateral femoral condyle. Focal defect within the adjacent posterolateral tibial plateau cartilage. 4. At the distal aspect of the tibial tunnel for the ACL reconstruction, there is mild fluid bright signal bordering the medial cortex of the proximal tibial metaphysis. There is also mild-to-moderate marrow edema surrounding the distal tibial tunnel in this region. This may represent mild stress reaction.  PATIENT SURVEYS:  LEFS: 52/80  COGNITION: Overall cognitive status: Within functional limits for tasks assessed     SENSATION: Not tested  EDEMA:  Mild swelling about Lt knee     POSTURE: No Significant postural limitations  PALPATION: No assessed   LOWER EXTREMITY ROM:  Active ROM Right eval Left eval  Hip flexion    Hip extension    Hip abduction    Hip adduction    Hip internal rotation    Hip external rotation    Knee flexion  Full   Knee extension  Full   Ankle dorsiflexion    Ankle plantarflexion    Ankle inversion    Ankle eversion     (Blank rows = not tested)  LOWER EXTREMITY MMT:  MMT Right eval Left eval  Hip flexion 5 5  Hip extension 5 4  Hip abduction 4 4  Hip adduction 4 4  Hip internal rotation    Hip external rotation    Knee flexion 5 5  Knee extension 5 5   Ankle dorsiflexion    Ankle plantarflexion    Ankle inversion    Ankle eversion     (Blank rows = not tested)  FUNCTIONAL TESTS:  SL squat: valgus collapse LLE Bounding: rigid landing, valgus collapse LLE  GAIT: Distance walked: 10 ft  Assistive device utilized: None Level of assistance: Complete Independence Comments: no obvious gait abnormalities  [facility] Adult PT Treatment:                                                DATE: 09/19/24 Therapeutic Exercise: Treadmill walking 2.5 mph x 5 min for warm up Seated piriformis stretch Manual Therapy: STM Lt glute, piriformis, ITB Trigger Point Dry Needling  Subsequent Treatment: Instructions reviewed, if requested by the patient, prior to subsequent dry needling treatment.   Patient Verbal Consent Given: Yes Education Handout Provided: Yes Muscles Treated: Lt piriformis, TFL Electrical Stimulation Performed: No Treatment Response/Outcome: twitch response, decreased pain   \ Therapeutic Activity:  HEP review including: Side lunge Sidelying hip abd and add Sidelying clam Hip 3 way on slider   [facility] Adult PT Treatment:                                                DATE: 09/12/24 Therapeutic Exercise: Demonstrated,performed and issued initial HEP.   Therapeutic Activity: Walk on treadmill 2.5 x 2 minutes, jog at 3.5 x 30 seconds, walk x 1 minute 2.5.   Self Care: Discussed introducing jog interval on level surface 20 minutes 1:2 jog to walk ratio  Reviewed protocol, current restrictions Discussed appropriate gym activity     PATIENT EDUCATION:  Education details: see treatment; POC Person educated: Patient Education method: Explanation, Demonstration, Tactile cues, Verbal cues, and Handouts Education comprehension: verbalized understanding, returned demonstration, verbal cues required,  tactile cues required, and needs further education  HOME EXERCISE PROGRAM: Access Code: [email or web address] URL: [email or web address] Date: 09/12/2024 Prepared by: Lucie Meeter  Exercises - Supine Hamstring Stretch with Strap  - 1 x daily - 3 x weekly - 3 sets - 30 sec  hold - Standing Single Leg Heel Raise  - 1 x daily - 3 x weekly - 2 sets - 10 reps - Side Stepping with Resistance at Ankles  - 1 x daily - 3 x weekly - 2 sets - 10 reps - Clamshell with Resistance  - 1 x daily - 3 x weekly - 2 sets - 10 reps - Supine Hamstring Curl on Swiss Ball  - 1 x daily - 3 x weekly - 2 sets - 10 reps - Forward Step Down  - 1 x daily - 3 x weekly - 2 sets - 10 reps - Single Leg Stance  - 1 x daily - 3 x weekly - 3 sets - 30 sec  hold - Sidelying Hip Circles  - 1 x daily - 3 x weekly - 3 sets - 10 reps - Sidelying Hip Adduction  - 1 x daily - 3 x weekly - 3 sets - 10 reps  ASSESSMENT:  CLINICAL IMPRESSION: Pt arrives requesting dry needling as she states this has helped her in the past and was recommended by MD. Pt with good response to manual work and dry needling. PT emphasized importance of performing hip strengthening and reviewed hip abd and add strengthening for HEP. Pt able to demo understanding   OBJECTIVE IMPAIRMENTS: decreased activity tolerance, decreased balance, decreased coordination, decreased endurance, decreased knowledge of condition, decreased strength, increased edema, increased fascial restrictions, impaired flexibility, improper body mechanics, and pain.     GOALS: Goals reviewed with patient? Yes  SHORT TERM GOALS:  Target date: 10/10/2024   Patient will be independent and compliant with initial HEP.   Baseline: initial HEP issued  Goal status: INITIAL  2.  Patient will demonstrate normalized mechanics with forward bounding to reduce stress on knee with running activity.  Baseline: see above Goal status: INITIAL  3.  Patient will demonstrate 5/5 hip strength  to improve stability about the chain with running/jumping activity.  Baseline: see above Goal status: INITIAL   LONG TERM GOALS: Target date: 11/10/2024    Patient will score >/= 65/80 on the LEFS (MCID is 9) to signify clinically meaningful improvement in functional abilities.   Baseline: see above Goal status: INITIAL  2.  Patient will be able to sprint at least 40 ft with minimal/no knee pain.  Baseline: unable  Goal status: INITIAL  3.   Patient will demonstrate normalized SL squat mechanics to improve knee stability.  Baseline: see above Goal status: INITIAL  4.  Patient will be able to complete 1 mile jog with minimal/no knee pain.  Baseline: unable  Goal status: INITIAL     PLAN:  PT FREQUENCY: 1x/week  PT DURATION: 8 weeks  PLANNED INTERVENTIONS: 97164- PT Re-evaluation, 97750- Physical Performance Testing, 97110-Therapeutic exercises, 97530- Therapeutic activity, [id number]- Neuromuscular re-education, 97535- Self Care, 02859- Manual therapy, [phone]- Gait training, [phone]- Aquatic Therapy, [phone]- Electrical stimulation (unattended), [phone]- Electrical stimulation (manual), [id number]- Vasopneumatic device, [id number]- Ionotophoresis 4mg /ml Dexamethasone, 20560 (1-2 muscles), 20561 (3+ muscles)- Dry Needling, Patient/Family education, Cryotherapy, and Moist heat  PLAN FOR NEXT SESSION: progress per protocol;assess response to dry needling, hip strengthening  Darice Conine, PT,DPT10/29/254:13 PM

## 2024-09-19 NOTE — Patient Instructions (Signed)

## 2024-09-26 ENCOUNTER — Ambulatory Visit: Attending: Orthopaedic Surgery

## 2024-09-26 DIAGNOSIS — G8929 Other chronic pain: Secondary | ICD-10-CM | POA: Diagnosis present

## 2024-09-26 DIAGNOSIS — M25562 Pain in left knee: Secondary | ICD-10-CM | POA: Insufficient documentation

## 2024-09-26 DIAGNOSIS — R600 Localized edema: Secondary | ICD-10-CM | POA: Diagnosis present

## 2024-09-26 DIAGNOSIS — M6281 Muscle weakness (generalized): Secondary | ICD-10-CM | POA: Insufficient documentation

## 2024-09-26 NOTE — Therapy (Signed)
 OUTPATIENT PHYSICAL THERAPY LOWER EXTREMITY TREATMENT   Patient Name: Whitney Turner MRN: [id number] DOB:[birth year], 48 y.o., female Today's Date: 09/26/2024  END OF SESSION:  PT End of Session - 09/26/24 0849     Visit Number 3    Number of Visits 9    Date for Recertification  11/10/24    Authorization Type [employer]    PT Start Time 0849    PT Stop Time 0930    PT Time Calculation (min) 41 min    Activity Tolerance Patient tolerated treatment well    Behavior During Therapy [facility] for tasks assessed/performed            Past Medical History:  Diagnosis Date   Allergy    Heart murmur    Herpes    Past Surgical History:  Procedure Laterality Date   HERNIA REPAIR  1982   KNEE SURGERY  08/2011   TONSILECTOMY/ADENOIDECTOMY WITH MYRINGOTOMY  2000   Patient Active Problem List   Diagnosis Date Noted   Abnormal weight gain 03/05/2024   Chronic left hip pain 02/27/2024   Tibialis posterior tendinitis, left 03/10/2023   Mouth ulcer 03/08/2023   Sore throat 03/08/2023   Poor concentration 12/30/2020   Hyperactive behavior 12/30/2020   Chronic left shoulder pain 11/17/2020   Elevated LDL cholesterol level 12/24/2019   Right lower quadrant pain 12/24/2019   Right upper quadrant pain 12/24/2019   Effusion of right knee 06/25/2019   Hemorrhoids 12/04/2018   CIN I (cervical intraepithelial neoplasia I) 04/03/2018   Lateral epicondylitis of left elbow 02/15/2018   Left hand pain 02/23/2016   History of repair of ACL of Left knee 01/26/2016   Plantar fasciitis, bilateral 12/29/2015   Genital herpes 12/01/2012   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/2012   Allergic rhinitis 12/01/2012   Seasonal allergies 12/01/2012    PCP: Antoniette Vermell LITTIE DEVONNA  REFERRING PROVIDER: Aleck Stalling, PA  REFERRING DIAG: other specified postprocedural status; jog/run program s/p Lt ACLR  THERAPY DIAG:  Chronic pain of left knee  Muscle weakness (generalized)  Localized edema  Rationale for Evaluation  and Treatment: Rehabilitation  ONSET DATE: 05/07/24  SUBJECTIVE:   SUBJECTIVE STATEMENT: Patient reports the knee is sore and swollen today. Thinks it was from running on the grass yesterday. She had significant soreness in her quads and adductors after her lower extremity workout last Wednesday that lasted until Sunday. She ran on Saturday and Sunday despite the soreness running at 12-13 minute mile pace. Her hip felt great until she ran on uneven surfaces yesterday.   PERTINENT HISTORY: LT ACLR  05/07/24  Patient is 4 months s/p Lt ACLR with allograft with referral to evaluate and treat for run/jog progression. She reports overall the knee is feeling pretty good. Still will get soreness in the knee. Will also feel achy in the knee with particular activities at the gym (squatting) with continued reps. She had recent f/u with surgical PA and was instructed that she can begin jogging, continue with gym, and dry needling as needed. Does not experience pain with her normal, daily activities. She tried outdoor jogging for short distances with interval walking for a total of 10 minutes and her knee felt fine. She also reports intermittent Lt hip pain.  PAIN:  Are you having pain? Yes: NPRS scale: 3 Pain location: Lt knee Pain description: sore Aggravating factors: overactivity, stretch, prolonged sitting Relieving factors: rest  PRECAUTIONS: Other: see protocol on file   RED FLAGS: None   WEIGHT BEARING RESTRICTIONS: No  FALLS:  Has patient fallen in last 6 months? No  LIVING ENVIRONMENT: Lives with: lives with their family Lives in: House/apartment Stairs: Yes: Internal: flight steps; on right going up and External: 3 steps; none Has following equipment at home: Crutches  OCCUPATION: umpire   PLOF: Independent  PATIENT GOALS: I need to run. Need to do sprints.   NEXT MD VISIT: January 2026   OBJECTIVE:  Note: Objective measures were completed at Evaluation unless otherwise  noted.  DIAGNOSTIC FINDINGS: IMPRESSION: 1. Small vertically oriented tear extending through the superior and inferior articular surfaces of the free edge of the root of the posterior horn of the medial meniscus. Vertically oriented tear extending through the inferior articular surface of the peripheral third of the meniscal triangle of the more medial aspect of the posterior horn of the medial meniscus. Tiny horizontal linear tear extending through the superior articular surface and the peripheral wall of the mid to anterior aspect of the body of the medial meniscus. 2. Full-thickness cartilage defect within the inferior aspect of the medial trochlea with subchondral cystic change. 3. Full-thickness cartilage loss within the mid transverse, posterior weight-bearing lateral femoral condyle extending into the posterior nonweightbearing lateral femoral condyle. Focal defect within the adjacent posterolateral tibial plateau cartilage. 4. At the distal aspect of the tibial tunnel for the ACL reconstruction, there is mild fluid bright signal bordering the medial cortex of the proximal tibial metaphysis. There is also mild-to-moderate marrow edema surrounding the distal tibial tunnel in this region. This may represent mild stress reaction.  PATIENT SURVEYS:  LEFS: 52/80  COGNITION: Overall cognitive status: Within functional limits for tasks assessed     SENSATION: Not tested  EDEMA:  Mild swelling about Lt knee     POSTURE: No Significant postural limitations  PALPATION: No assessed   LOWER EXTREMITY ROM:  Active ROM Right eval Left eval  Hip flexion    Hip extension    Hip abduction    Hip adduction    Hip internal rotation    Hip external rotation    Knee flexion  Full   Knee extension  Full   Ankle dorsiflexion    Ankle plantarflexion    Ankle inversion    Ankle eversion     (Blank rows = not tested)  LOWER EXTREMITY MMT:  MMT Right eval Left eval  09/26/24 Left   Hip flexion 5 5   Hip extension 5 4   Hip abduction 4 4 4   Hip adduction 4 4   Hip internal rotation     Hip external rotation     Knee flexion 5 5   Knee extension 5 5   Ankle dorsiflexion     Ankle plantarflexion     Ankle inversion     Ankle eversion      (Blank rows = not tested)  FUNCTIONAL TESTS:  SL squat: valgus collapse LLE Bounding: rigid landing, valgus collapse LLE  GAIT: Distance walked: 10 ft  Assistive device utilized: None Level of assistance: Complete Independence Comments: no obvious gait abnormalities    [facility] Adult PT Treatment:                                                DATE: 09/26/24 Therapeutic Exercise: Reviewed HEP discussing which exercises to prioritize for glute strength  Manual Therapy: Skilled palpation of trigger points  Trigger Point Dry Needling  Subsequent Treatment: Instructions provided previously at initial dry needling treatment.   Patient Verbal Consent Given: Yes Education Handout Provided: Previously Provided Muscles Treated: Lt vastus lateralis, rectus femoris, gluteals, piriformis  Electrical Stimulation Performed: No Treatment Response/Outcome: twitch response elicited   Self Care: Reviewed and printed post-operative independent gym program discussing frequency and appropriate rest  Discussed running progression which includes only changing 1 variable at a time (distance, speed, or terrain). Recommended that she continue same distance on level surface and can minimally increase pace to 11 minute mile pace.                                                                                                                        [facility] Adult PT Treatment:                                                DATE: 09/19/24 Therapeutic Exercise: Treadmill walking 2.5 mph x 5 min for warm up Seated piriformis stretch Manual Therapy: STM Lt glute, piriformis, ITB Trigger Point Dry Needling  Subsequent Treatment:  Instructions reviewed, if requested by the patient, prior to subsequent dry needling treatment.   Patient Verbal Consent Given: Yes Education Handout Provided: Yes Muscles Treated: Lt piriformis, TFL Electrical Stimulation Performed: No Treatment Response/Outcome: twitch response, decreased pain   \ Therapeutic Activity:  HEP review including: Side lunge Sidelying hip abd and add Sidelying clam Hip 3 way on slider   [facility] Adult PT Treatment:                                                DATE: 09/12/24 Therapeutic Exercise: Demonstrated,performed and issued initial HEP.   Therapeutic Activity: Walk on treadmill 2.5 x 2 minutes, jog at 3.5 x 30 seconds, walk x 1 minute 2.5.   Self Care: Discussed introducing jog interval on level surface 20 minutes 1:2 jog to walk ratio  Reviewed protocol, current restrictions Discussed appropriate gym activity     PATIENT EDUCATION:  Education details: see treatment Person educated: Patient Education method: Explanation, Demonstration, Tactile cues, Verbal cues, and Handouts Education comprehension: verbalized understanding, returned demonstration, verbal cues required, tactile cues required, and needs further education  HOME EXERCISE PROGRAM: Access Code: [email or web address] URL: [email or web address] Date: 09/12/2024 Prepared by: Lucie Meeter  Exercises - Supine Hamstring Stretch with Strap  - 1 x daily - 3 x weekly - 3 sets - 30 sec  hold - Standing Single Leg Heel Raise  - 1 x daily - 3 x weekly - 2 sets - 10 reps - Side Stepping with Resistance at Ankles  - 1 x daily - 3 x weekly - 2 sets - 10 reps - Clamshell with Resistance  -  1 x daily - 3 x weekly - 2 sets - 10 reps - Supine Hamstring Curl on Swiss Ball  - 1 x daily - 3 x weekly - 2 sets - 10 reps - Forward Step Down  - 1 x daily - 3 x weekly - 2 sets - 10 reps - Single Leg Stance  - 1 x daily - 3 x weekly - 3 sets - 30 sec  hold - Sidelying Hip Circles  - 1 x daily - 3  x weekly - 3 sets - 10 reps - Sidelying Hip Adduction  - 1 x daily - 3 x weekly - 3 sets - 10 reps  ASSESSMENT:  CLINICAL IMPRESSION: Continued with further TPDN to gluteals and quadriceps musculature with excellent twitch response elicited. Length discussion regarding frequency of strength training and running progression as patient is likely continuing to overdo it based on complaints of soreness and swelling that are continuing to occur. We discussed manipulating one variable at a time in regards to running with recommendation to either minimally increase speed or distance between now and next visit with patient verbalizing understanding. We reviewed independent gym program that should only being occurring 2-3 times weekly. We also discussed continued emphasis on gluteal strengthening given ongoing hip pain and continued hip abductor weakness with patient verbalizing understanding.    OBJECTIVE IMPAIRMENTS: decreased activity tolerance, decreased balance, decreased coordination, decreased endurance, decreased knowledge of condition, decreased strength, increased edema, increased fascial restrictions, impaired flexibility, improper body mechanics, and pain.     GOALS: Goals reviewed with patient? Yes  SHORT TERM GOALS: Target date: 10/10/2024   Patient will be independent and compliant with initial HEP.   Baseline: initial HEP issued  Goal status: INITIAL  2.  Patient will demonstrate normalized mechanics with forward bounding to reduce stress on knee with running activity.  Baseline: see above Goal status: INITIAL  3.  Patient will demonstrate 5/5 hip strength to improve stability about the chain with running/jumping activity.  Baseline: see above Goal status: INITIAL   LONG TERM GOALS: Target date: 11/10/2024    Patient will score >/= 65/80 on the LEFS (MCID is 9) to signify clinically meaningful improvement in functional abilities.   Baseline: see above Goal status:  INITIAL  2.  Patient will be able to sprint at least 40 ft with minimal/no knee pain.  Baseline: unable  Goal status: INITIAL  3.   Patient will demonstrate normalized SL squat mechanics to improve knee stability.  Baseline: see above Goal status: INITIAL  4.  Patient will be able to complete 1 mile jog with minimal/no knee pain.  Baseline: unable  Goal status: INITIAL     PLAN:  PT FREQUENCY: 1x/week  PT DURATION: 8 weeks  PLANNED INTERVENTIONS: 97164- PT Re-evaluation, 97750- Physical Performance Testing, 97110-Therapeutic exercises, 97530- Therapeutic activity, [id number]- Neuromuscular re-education, 97535- Self Care, [id number]- Manual therapy, [phone]- Gait training, [phone]- Aquatic Therapy, [phone]- Electrical stimulation (unattended), [phone]- Electrical stimulation (manual), [id number]- Vasopneumatic device, [id number]- Ionotophoresis 4mg /ml Dexamethasone, 20560 (1-2 muscles), 20561 (3+ muscles)- Dry Needling, Patient/Family education, Cryotherapy, and Moist heat  PLAN FOR NEXT SESSION: progress per protocol;assess response to dry needling, hip strengthening  Lucie Meeter, PT, DPT, ATC 09/26/24 10:33 AM

## 2024-10-01 ENCOUNTER — Ambulatory Visit

## 2024-10-01 DIAGNOSIS — M6281 Muscle weakness (generalized): Secondary | ICD-10-CM

## 2024-10-01 DIAGNOSIS — R600 Localized edema: Secondary | ICD-10-CM

## 2024-10-01 DIAGNOSIS — G8929 Other chronic pain: Secondary | ICD-10-CM

## 2024-10-01 DIAGNOSIS — M25562 Pain in left knee: Secondary | ICD-10-CM | POA: Diagnosis not present

## 2024-10-01 NOTE — Therapy (Signed)
 OUTPATIENT PHYSICAL THERAPY LOWER EXTREMITY TREATMENT   Patient Name: Whitney Turner MRN: [id number] DOB:[birth year], 48 y.o., female Today's Date: 10/01/2024  END OF SESSION:  PT End of Session - 10/01/24 0716     Visit Number 4    Number of Visits 9    Date for Recertification  11/10/24    Authorization Type [employer]    Authorization - Visit Number 17    Authorization - Number of Visits 20    PT Start Time 0717    PT Stop Time 0800    PT Time Calculation (min) 43 min    Activity Tolerance Patient tolerated treatment well    Behavior During Therapy [facility] for tasks assessed/performed             Past Medical History:  Diagnosis Date   Allergy    Heart murmur    Herpes    Past Surgical History:  Procedure Laterality Date   HERNIA REPAIR  1982   KNEE SURGERY  08/2011   TONSILECTOMY/ADENOIDECTOMY WITH MYRINGOTOMY  2000   Patient Active Problem List   Diagnosis Date Noted   Abnormal weight gain 03/05/2024   Chronic left hip pain 02/27/2024   Tibialis posterior tendinitis, left 03/10/2023   Mouth ulcer 03/08/2023   Sore throat 03/08/2023   Poor concentration 12/30/2020   Hyperactive behavior 12/30/2020   Chronic left shoulder pain 11/17/2020   Elevated LDL cholesterol level 12/24/2019   Right lower quadrant pain 12/24/2019   Right upper quadrant pain 12/24/2019   Effusion of right knee 06/25/2019   Hemorrhoids 12/04/2018   CIN I (cervical intraepithelial neoplasia I) 04/03/2018   Lateral epicondylitis of left elbow 02/15/2018   Left hand pain 02/23/2016   History of repair of ACL of Left knee 01/26/2016   Plantar fasciitis, bilateral 12/29/2015   Genital herpes 12/01/2012   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/2012   Allergic rhinitis 12/01/2012   Seasonal allergies 12/01/2012    PCP: Antoniette Vermell LITTIE DEVONNA  REFERRING PROVIDER: Aleck Stalling, PA  REFERRING DIAG: other specified postprocedural status; jog/run program s/p Lt ACLR  THERAPY DIAG:  Chronic pain of left  knee  Muscle weakness (generalized)  Localized edema  Rationale for Evaluation and Treatment: Rehabilitation  ONSET DATE: 05/07/24  SUBJECTIVE:   SUBJECTIVE STATEMENT: Overall feeling good. Had pain in the back of the Lt hip/thigh after busy work days (walking, lifting). The knee has been ok.   PERTINENT HISTORY: LT ACLR  05/07/24  Patient is 4 months s/p Lt ACLR with allograft with referral to evaluate and treat for run/jog progression. She reports overall the knee is feeling pretty good. Still will get soreness in the knee. Will also feel achy in the knee with particular activities at the gym (squatting) with continued reps. She had recent f/u with surgical PA and was instructed that she can begin jogging, continue with gym, and dry needling as needed. Does not experience pain with her normal, daily activities. She tried outdoor jogging for short distances with interval walking for a total of 10 minutes and her knee felt fine. She also reports intermittent Lt hip pain.  PAIN:  Are you having pain? Yes: NPRS scale: none currently; at worst 5 Pain location: Lt knee/hip Pain description: sore Aggravating factors: overactivity, stretch, prolonged sitting Relieving factors: rest  PRECAUTIONS: Other: see protocol on file   RED FLAGS: None   WEIGHT BEARING RESTRICTIONS: No  FALLS:  Has patient fallen in last 6 months? No  LIVING ENVIRONMENT: Lives with: lives with their  family Lives in: House/apartment Stairs: Yes: Internal: flight steps; on right going up and External: 3 steps; none Has following equipment at home: Crutches  OCCUPATION: umpire   PLOF: Independent  PATIENT GOALS: I need to run. Need to do sprints.   NEXT MD VISIT: January 2026   OBJECTIVE:  Note: Objective measures were completed at Evaluation unless otherwise noted.  DIAGNOSTIC FINDINGS: IMPRESSION: 1. Small vertically oriented tear extending through the superior and inferior articular surfaces of  the free edge of the root of the posterior horn of the medial meniscus. Vertically oriented tear extending through the inferior articular surface of the peripheral third of the meniscal triangle of the more medial aspect of the posterior horn of the medial meniscus. Tiny horizontal linear tear extending through the superior articular surface and the peripheral wall of the mid to anterior aspect of the body of the medial meniscus. 2. Full-thickness cartilage defect within the inferior aspect of the medial trochlea with subchondral cystic change. 3. Full-thickness cartilage loss within the mid transverse, posterior weight-bearing lateral femoral condyle extending into the posterior nonweightbearing lateral femoral condyle. Focal defect within the adjacent posterolateral tibial plateau cartilage. 4. At the distal aspect of the tibial tunnel for the ACL reconstruction, there is mild fluid bright signal bordering the medial cortex of the proximal tibial metaphysis. There is also mild-to-moderate marrow edema surrounding the distal tibial tunnel in this region. This may represent mild stress reaction.  PATIENT SURVEYS:  LEFS: 52/80  COGNITION: Overall cognitive status: Within functional limits for tasks assessed     SENSATION: Not tested  EDEMA:  Mild swelling about Lt knee     POSTURE: No Significant postural limitations  PALPATION: No assessed   LOWER EXTREMITY ROM:  Active ROM Right eval Left eval  Hip flexion    Hip extension    Hip abduction    Hip adduction    Hip internal rotation    Hip external rotation    Knee flexion  Full   Knee extension  Full   Ankle dorsiflexion    Ankle plantarflexion    Ankle inversion    Ankle eversion     (Blank rows = not tested)  LOWER EXTREMITY MMT:  MMT Right eval Left eval 09/26/24 Left  10/01/24  Hip flexion 5 5    Hip extension 5 4  Lt: 4+; Rt: 5  Hip abduction 4 4 4  Lt: 4+; Rt: 5  Hip adduction 4 4  Lt: 4+; Rt: 5   Hip internal rotation      Hip external rotation      Knee flexion 5 5    Knee extension 5 5    Ankle dorsiflexion      Ankle plantarflexion      Ankle inversion      Ankle eversion       (Blank rows = not tested)  FUNCTIONAL TESTS:  SL squat: valgus collapse LLE Bounding: rigid landing, valgus collapse LLE  GAIT: Distance walked: 10 ft  Assistive device utilized: None Level of assistance: Complete Independence Comments: no obvious gait abnormalities   [facility] Adult PT Treatment:                                                DATE: 10/01/24 Therapeutic Exercise: Treadmill 3.6 @ 3 minutes, 2.0 @ 2 minutes  Lunge hip flexor stretch x  30 sec, x 30 sec IT band Hip abduction matrix 2 x 10; 5 lbs  Hip adduction matrix 2 x 10; 5 lbs  Sidelying hip circles CW/CCW 2 x 10  Hip MMT Manual Therapy: Skilled palpation of trigger points  Trigger Point Dry Needling  Subsequent Treatment: Instructions provided previously at initial dry needling treatment.   Patient Verbal Consent Given: Yes Education Handout Provided: Previously Provided Muscles Treated: Lt vastus lateralis, bicep femoris, gluteals  Electrical Stimulation Performed: No Treatment Response/Outcome: twitch response elicited     Therapeutic Activity: Forward bounding x 10 each   [facility] Adult PT Treatment:                                                DATE: 09/26/24 Therapeutic Exercise: Reviewed HEP discussing which exercises to prioritize for glute strength  Manual Therapy: Skilled palpation of trigger points Trigger Point Dry Needling  Subsequent Treatment: Instructions provided previously at initial dry needling treatment.   Patient Verbal Consent Given: Yes Education Handout Provided: Previously Provided Muscles Treated: Lt vastus lateralis, rectus femoris, gluteals, piriformis  Electrical Stimulation Performed: No Treatment Response/Outcome: twitch response elicited   Self Care: Reviewed and printed  post-operative independent gym program discussing frequency and appropriate rest  Discussed running progression which includes only changing 1 variable at a time (distance, speed, or terrain). Recommended that she continue same distance on level surface and can minimally increase pace to 11 minute mile pace.                                                                                                                        [facility] Adult PT Treatment:                                                DATE: 09/19/24 Therapeutic Exercise: Treadmill walking 2.5 mph x 5 min for warm up Seated piriformis stretch Manual Therapy: STM Lt glute, piriformis, ITB Trigger Point Dry Needling  Subsequent Treatment: Instructions reviewed, if requested by the patient, prior to subsequent dry needling treatment.   Patient Verbal Consent Given: Yes Education Handout Provided: Yes Muscles Treated: Lt piriformis, TFL Electrical Stimulation Performed: No Treatment Response/Outcome: twitch response, decreased pain   \ Therapeutic Activity:  HEP review including: Side lunge Sidelying hip abd and add Sidelying clam Hip 3 way on slider   [facility] Adult PT Treatment:                                                DATE: 09/12/24 Therapeutic Exercise: Demonstrated,performed and issued initial HEP.  Therapeutic Activity: Walk on treadmill 2.5 x 2 minutes, jog at 3.5 x 30 seconds, walk x 1 minute 2.5.   Self Care: Discussed introducing jog interval on level surface 20 minutes 1:2 jog to walk ratio  Reviewed protocol, current restrictions Discussed appropriate gym activity     PATIENT EDUCATION:  Education details: HEP review Person educated: Patient Education method: Explanation Education comprehension: verbalized understanding  HOME EXERCISE PROGRAM: Access Code: [email or web address] URL: [email or web address] Date: 09/12/2024 Prepared by: Lucie Meeter  Exercises - Supine Hamstring Stretch  with Strap  - 1 x daily - 3 x weekly - 3 sets - 30 sec  hold - Standing Single Leg Heel Raise  - 1 x daily - 3 x weekly - 2 sets - 10 reps - Side Stepping with Resistance at Ankles  - 1 x daily - 3 x weekly - 2 sets - 10 reps - Clamshell with Resistance  - 1 x daily - 3 x weekly - 2 sets - 10 reps - Supine Hamstring Curl on Swiss Ball  - 1 x daily - 3 x weekly - 2 sets - 10 reps - Forward Step Down  - 1 x daily - 3 x weekly - 2 sets - 10 reps - Single Leg Stance  - 1 x daily - 3 x weekly - 3 sets - 30 sec  hold - Sidelying Hip Circles  - 1 x daily - 3 x weekly - 3 sets - 10 reps - Sidelying Hip Adduction  - 1 x daily - 3 x weekly - 3 sets - 10 reps  ASSESSMENT:  CLINICAL IMPRESSION: Excellent twitch response elicited with TPDN to quad, hamstring, and gluteals. Focused on progression of gluteal strengthening and running activity with good tolerance. Requires cues for soft landing and to limit knee valgus during bounding with ability to correct with cues. Fatigues with gluteal strengthening requiring cues for pacing. Hip strength has improved bilaterally, though weakness remains in LLE.    OBJECTIVE IMPAIRMENTS: decreased activity tolerance, decreased balance, decreased coordination, decreased endurance, decreased knowledge of condition, decreased strength, increased edema, increased fascial restrictions, impaired flexibility, improper body mechanics, and pain.     GOALS: Goals reviewed with patient? Yes  SHORT TERM GOALS: Target date: 10/10/2024   Patient will be independent and compliant with initial HEP.   Baseline: initial HEP issued  Goal status: MET  2.  Patient will demonstrate normalized mechanics with forward bounding to reduce stress on knee with running activity.  Baseline: see above Goal status: progressing   3.  Patient will demonstrate 5/5 hip strength to improve stability about the chain with running/jumping activity.  Baseline: see above Goal status: partially met     LONG TERM GOALS: Target date: 11/10/2024    Patient will score >/= 65/80 on the LEFS (MCID is 9) to signify clinically meaningful improvement in functional abilities.   Baseline: see above Goal status: INITIAL  2.  Patient will be able to sprint at least 40 ft with minimal/no knee pain.  Baseline: unable  Goal status: INITIAL  3.   Patient will demonstrate normalized SL squat mechanics to improve knee stability.  Baseline: see above Goal status: INITIAL  4.  Patient will be able to complete 1 mile jog with minimal/no knee pain.  Baseline: unable  Goal status: INITIAL     PLAN:  PT FREQUENCY: 1x/week  PT DURATION: 8 weeks  PLANNED INTERVENTIONS: 97164- PT Re-evaluation, 97750- Physical Performance Testing, 97110-Therapeutic exercises, 97530- Therapeutic activity, [id number]- Neuromuscular re-education, 97535-  Self Care, [id number]- Manual therapy, [phone]- Gait training, [phone]- Aquatic Therapy, [phone]- Electrical stimulation (unattended), [phone]- Electrical stimulation (manual), [id number]- Vasopneumatic device, [id number]- Ionotophoresis 4mg /ml Dexamethasone, 20560 (1-2 muscles), 20561 (3+ muscles)- Dry Needling, Patient/Family education, Cryotherapy, and Moist heat  PLAN FOR NEXT SESSION: progress per protocol;assess response to dry needling, hip strengthening; CAN PROGRESS TO SPRINT AT 5 MONTHS  Naw Lasala, PT, DPT, ATC 10/01/24 8:03 AM

## 2024-10-12 NOTE — Therapy (Unsigned)
 OUTPATIENT PHYSICAL THERAPY LOWER EXTREMITY TREATMENT   Patient Name: Whitney Turner MRN: [id number] DOB:[birth year], 48 y.o., female Today's Date: 10/12/2024  END OF SESSION:       Past Medical History:  Diagnosis Date   Allergy    Heart murmur    Herpes    Past Surgical History:  Procedure Laterality Date   HERNIA REPAIR  1982   KNEE SURGERY  08/2011   TONSILECTOMY/ADENOIDECTOMY WITH MYRINGOTOMY  2000   Patient Active Problem List   Diagnosis Date Noted   Abnormal weight gain 03/05/2024   Chronic left hip pain 02/27/2024   Tibialis posterior tendinitis, left 03/10/2023   Mouth ulcer 03/08/2023   Sore throat 03/08/2023   Poor concentration 12/30/2020   Hyperactive behavior 12/30/2020   Chronic left shoulder pain 11/17/2020   Elevated LDL cholesterol level 12/24/2019   Right lower quadrant pain 12/24/2019   Right upper quadrant pain 12/24/2019   Effusion of right knee 06/25/2019   Hemorrhoids 12/04/2018   CIN I (cervical intraepithelial neoplasia I) 04/03/2018   Lateral epicondylitis of left elbow 02/15/2018   Left hand pain 02/23/2016   History of repair of ACL of Left knee 01/26/2016   Plantar fasciitis, bilateral 12/29/2015   Genital herpes 12/01/2012   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/2012   Allergic rhinitis 12/01/2012   Seasonal allergies 12/01/2012    PCP: Antoniette Vermell LITTIE DEVONNA  REFERRING PROVIDER: Aleck Stalling, PA  REFERRING DIAG: other specified postprocedural status; jog/run program s/p Lt ACLR  THERAPY DIAG:  No diagnosis found.  Rationale for Evaluation and Treatment: Rehabilitation  ONSET DATE: 05/07/24  SUBJECTIVE:   SUBJECTIVE STATEMENT: Overall feeling good. Had pain in the back of the Lt hip/thigh after busy work days (walking, lifting). The knee has been ok.   PERTINENT HISTORY: LT ACLR  05/07/24  Patient is 4 months s/p Lt ACLR with allograft with referral to evaluate and treat for run/jog progression. She reports overall the knee is  feeling pretty good. Still will get soreness in the knee. Will also feel achy in the knee with particular activities at the gym (squatting) with continued reps. She had recent f/u with surgical PA and was instructed that she can begin jogging, continue with gym, and dry needling as needed. Does not experience pain with her normal, daily activities. She tried outdoor jogging for short distances with interval walking for a total of 10 minutes and her knee felt fine. She also reports intermittent Lt hip pain.  PAIN:  Are you having pain? Yes: NPRS scale: none currently; at worst 5 Pain location: Lt knee/hip Pain description: sore Aggravating factors: overactivity, stretch, prolonged sitting Relieving factors: rest  PRECAUTIONS: Other: see protocol on file   RED FLAGS: None   WEIGHT BEARING RESTRICTIONS: No  FALLS:  Has patient fallen in last 6 months? No  LIVING ENVIRONMENT: Lives with: lives with their family Lives in: House/apartment Stairs: Yes: Internal: flight steps; on right going up and External: 3 steps; none Has following equipment at home: Crutches  OCCUPATION: umpire   PLOF: Independent  PATIENT GOALS: I need to run. Need to do sprints.   NEXT MD VISIT: January 2026   OBJECTIVE:  Note: Objective measures were completed at Evaluation unless otherwise noted.  DIAGNOSTIC FINDINGS: IMPRESSION: 1. Small vertically oriented tear extending through the superior and inferior articular surfaces of the free edge of the root of the posterior horn of the medial meniscus. Vertically oriented tear extending through the inferior articular surface of the peripheral third of the  meniscal triangle of the more medial aspect of the posterior horn of the medial meniscus. Tiny horizontal linear tear extending through the superior articular surface and the peripheral wall of the mid to anterior aspect of the body of the medial meniscus. 2. Full-thickness cartilage defect within the  inferior aspect of the medial trochlea with subchondral cystic change. 3. Full-thickness cartilage loss within the mid transverse, posterior weight-bearing lateral femoral condyle extending into the posterior nonweightbearing lateral femoral condyle. Focal defect within the adjacent posterolateral tibial plateau cartilage. 4. At the distal aspect of the tibial tunnel for the ACL reconstruction, there is mild fluid bright signal bordering the medial cortex of the proximal tibial metaphysis. There is also mild-to-moderate marrow edema surrounding the distal tibial tunnel in this region. This may represent mild stress reaction.  PATIENT SURVEYS:  LEFS: 52/80  COGNITION: Overall cognitive status: Within functional limits for tasks assessed     SENSATION: Not tested  EDEMA:  Mild swelling about Lt knee     POSTURE: No Significant postural limitations  PALPATION: No assessed   LOWER EXTREMITY ROM:  Active ROM Right eval Left eval  Hip flexion    Hip extension    Hip abduction    Hip adduction    Hip internal rotation    Hip external rotation    Knee flexion  Full   Knee extension  Full   Ankle dorsiflexion    Ankle plantarflexion    Ankle inversion    Ankle eversion     (Blank rows = not tested)  LOWER EXTREMITY MMT:  MMT Right eval Left eval 09/26/24 Left  10/01/24  Hip flexion 5 5    Hip extension 5 4  Lt: 4+; Rt: 5  Hip abduction 4 4 4  Lt: 4+; Rt: 5  Hip adduction 4 4  Lt: 4+; Rt: 5  Hip internal rotation      Hip external rotation      Knee flexion 5 5    Knee extension 5 5    Ankle dorsiflexion      Ankle plantarflexion      Ankle inversion      Ankle eversion       (Blank rows = not tested)  FUNCTIONAL TESTS:  SL squat: valgus collapse LLE Bounding: rigid landing, valgus collapse LLE  GAIT: Distance walked: 10 ft  Assistive device utilized: None Level of assistance: Complete Independence Comments: no obvious gait abnormalities   [facility]  Adult PT Treatment:                                                DATE: 10/01/24 Therapeutic Exercise: Treadmill 3.6 @ 3 minutes, 2.0 @ 2 minutes  Lunge hip flexor stretch x 30 sec, x 30 sec IT band Hip abduction matrix 2 x 10; 5 lbs  Hip adduction matrix 2 x 10; 5 lbs  Sidelying hip circles CW/CCW 2 x 10  Hip MMT Manual Therapy: Skilled palpation of trigger points  Trigger Point Dry Needling  Subsequent Treatment: Instructions provided previously at initial dry needling treatment.   Patient Verbal Consent Given: Yes Education Handout Provided: Previously Provided Muscles Treated: Lt vastus lateralis, bicep femoris, gluteals  Electrical Stimulation Performed: No Treatment Response/Outcome: twitch response elicited     Therapeutic Activity: Forward bounding x 10 each   [facility] Adult PT Treatment:  DATE: 09/26/24 Therapeutic Exercise: Reviewed HEP discussing which exercises to prioritize for glute strength  Manual Therapy: Skilled palpation of trigger points Trigger Point Dry Needling  Subsequent Treatment: Instructions provided previously at initial dry needling treatment.   Patient Verbal Consent Given: Yes Education Handout Provided: Previously Provided Muscles Treated: Lt vastus lateralis, rectus femoris, gluteals, piriformis  Electrical Stimulation Performed: No Treatment Response/Outcome: twitch response elicited   Self Care: Reviewed and printed post-operative independent gym program discussing frequency and appropriate rest  Discussed running progression which includes only changing 1 variable at a time (distance, speed, or terrain). Recommended that she continue same distance on level surface and can minimally increase pace to 11 minute mile pace.                                                                                                                        [facility] Adult PT Treatment:                                                 DATE: 09/19/24 Therapeutic Exercise: Treadmill walking 2.5 mph x 5 min for warm up Seated piriformis stretch Manual Therapy: STM Lt glute, piriformis, ITB Trigger Point Dry Needling  Subsequent Treatment: Instructions reviewed, if requested by the patient, prior to subsequent dry needling treatment.   Patient Verbal Consent Given: Yes Education Handout Provided: Yes Muscles Treated: Lt piriformis, TFL Electrical Stimulation Performed: No Treatment Response/Outcome: twitch response, decreased pain   \ Therapeutic Activity:  HEP review including: Side lunge Sidelying hip abd and add Sidelying clam Hip 3 way on slider   [facility] Adult PT Treatment:                                                DATE: 09/12/24 Therapeutic Exercise: Demonstrated,performed and issued initial HEP.   Therapeutic Activity: Walk on treadmill 2.5 x 2 minutes, jog at 3.5 x 30 seconds, walk x 1 minute 2.5.   Self Care: Discussed introducing jog interval on level surface 20 minutes 1:2 jog to walk ratio  Reviewed protocol, current restrictions Discussed appropriate gym activity     PATIENT EDUCATION:  Education details: HEP review Person educated: Patient Education method: Explanation Education comprehension: verbalized understanding  HOME EXERCISE PROGRAM: Access Code: [email or web address] URL: [email or web address] Date: 09/12/2024 Prepared by: Lucie Meeter  Exercises - Supine Hamstring Stretch with Strap  - 1 x daily - 3 x weekly - 3 sets - 30 sec  hold - Standing Single Leg Heel Raise  - 1 x daily - 3 x weekly - 2 sets - 10 reps - Side Stepping with Resistance at Ankles  - 1 x daily - 3 x weekly - 2 sets - 10 reps -  Clamshell with Resistance  - 1 x daily - 3 x weekly - 2 sets - 10 reps - Supine Hamstring Curl on Swiss Ball  - 1 x daily - 3 x weekly - 2 sets - 10 reps - Forward Step Down  - 1 x daily - 3 x weekly - 2 sets - 10 reps - Single Leg Stance  - 1 x daily - 3 x  weekly - 3 sets - 30 sec  hold - Sidelying Hip Circles  - 1 x daily - 3 x weekly - 3 sets - 10 reps - Sidelying Hip Adduction  - 1 x daily - 3 x weekly - 3 sets - 10 reps  ASSESSMENT:  CLINICAL IMPRESSION: Excellent twitch response elicited with TPDN to quad, hamstring, and gluteals. Focused on progression of gluteal strengthening and running activity with good tolerance. Requires cues for soft landing and to limit knee valgus during bounding with ability to correct with cues. Fatigues with gluteal strengthening requiring cues for pacing. Hip strength has improved bilaterally, though weakness remains in LLE.    OBJECTIVE IMPAIRMENTS: decreased activity tolerance, decreased balance, decreased coordination, decreased endurance, decreased knowledge of condition, decreased strength, increased edema, increased fascial restrictions, impaired flexibility, improper body mechanics, and pain.     GOALS: Goals reviewed with patient? Yes  SHORT TERM GOALS: Target date: 10/10/2024   Patient will be independent and compliant with initial HEP.   Baseline: initial HEP issued  Goal status: MET  2.  Patient will demonstrate normalized mechanics with forward bounding to reduce stress on knee with running activity.  Baseline: see above Goal status: progressing   3.  Patient will demonstrate 5/5 hip strength to improve stability about the chain with running/jumping activity.  Baseline: see above Goal status: partially met    LONG TERM GOALS: Target date: 11/10/2024    Patient will score >/= 65/80 on the LEFS (MCID is 9) to signify clinically meaningful improvement in functional abilities.   Baseline: see above Goal status: INITIAL  2.  Patient will be able to sprint at least 40 ft with minimal/no knee pain.  Baseline: unable  Goal status: INITIAL  3.   Patient will demonstrate normalized SL squat mechanics to improve knee stability.  Baseline: see above Goal status: INITIAL  4.  Patient  will be able to complete 1 mile jog with minimal/no knee pain.  Baseline: unable  Goal status: INITIAL     PLAN:  PT FREQUENCY: 1x/week  PT DURATION: 8 weeks  PLANNED INTERVENTIONS: 02835- PT Re-evaluation, 97750- Physical Performance Testing, 97110-Therapeutic exercises, 97530- Therapeutic activity, [id number]- Neuromuscular re-education, 97535- Self Care, 02859- Manual therapy, [phone]- Gait training, [phone]- Aquatic Therapy, [phone]- Electrical stimulation (unattended), [phone]- Electrical stimulation (manual), [id number]- Vasopneumatic device, [id number]- Ionotophoresis 4mg /ml Dexamethasone, [id number] (1-2 muscles), [id number] (3+ muscles)- Dry Needling, Patient/Family education, Cryotherapy, and Moist heat  PLAN FOR NEXT SESSION: progress per protocol;assess response to dry needling, hip strengthening; CAN PROGRESS TO SPRINT AT 5 MONTHS  Samantha P[place]a, PT, DPT, ATC 10/12/24 3:38 PM

## 2024-10-16 ENCOUNTER — Ambulatory Visit: Admitting: Physical Therapy

## 2024-10-16 ENCOUNTER — Encounter: Payer: Self-pay | Admitting: Physical Therapy

## 2024-10-16 DIAGNOSIS — M25562 Pain in left knee: Secondary | ICD-10-CM | POA: Diagnosis not present

## 2024-10-16 DIAGNOSIS — G8929 Other chronic pain: Secondary | ICD-10-CM

## 2024-10-16 NOTE — Therapy (Addendum)
 OUTPATIENT PHYSICAL THERAPY LOWER EXTREMITY TREATMENT   Patient Name: Ceniyah Thorp MRN: [id number] DOB:1976/09/03, 48 y.o., female Today's Date: 10/16/2024  END OF SESSION:  PT End of Session - 10/16/24 1526     Visit Number 5    Number of Visits 9    Date for Recertification  11/10/24    Authorization Type [employer]    Authorization - Number of Visits 20    PT Start Time 1530    PT Stop Time 1615    PT Time Calculation (min) 45 min    Activity Tolerance Patient tolerated treatment well    Behavior During Therapy [facility] for tasks assessed/performed              Past Medical History:  Diagnosis Date   Allergy    Heart murmur    Herpes    Past Surgical History:  Procedure Laterality Date   HERNIA REPAIR  1982   KNEE SURGERY  08/2011   TONSILECTOMY/ADENOIDECTOMY WITH MYRINGOTOMY  2000   Patient Active Problem List   Diagnosis Date Noted   Abnormal weight gain 03/05/2024   Chronic left hip pain 02/27/2024   Tibialis posterior tendinitis, left 03/10/2023   Mouth ulcer 03/08/2023   Sore throat 03/08/2023   Poor concentration 12/30/2020   Hyperactive behavior 12/30/2020   Chronic left shoulder pain 11/17/2020   Elevated LDL cholesterol level 12/24/2019   Right lower quadrant pain 12/24/2019   Right upper quadrant pain 12/24/2019   Effusion of right knee 06/25/2019   Hemorrhoids 12/04/2018   CIN I (cervical intraepithelial neoplasia I) 04/03/2018   Lateral epicondylitis of left elbow 02/15/2018   Left hand pain 02/23/2016   History of repair of ACL of Left knee 01/26/2016   Plantar fasciitis, bilateral 12/29/2015   Genital herpes 12/01/2012   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/2012   Allergic rhinitis 12/01/2012   Seasonal allergies 12/01/2012    PCP: Antoniette Vermell LITTIE DEVONNA  REFERRING PROVIDER: Aleck Stalling, PA  REFERRING DIAG: other specified postprocedural status; jog/run program s/p Lt ACLR  THERAPY DIAG:  Chronic pain of left knee  Rationale for Evaluation and  Treatment: Rehabilitation  ONSET DATE: 05/07/24  SUBJECTIVE:   SUBJECTIVE STATEMENT: Pt reports she's been having hip issues, she thinks could be IT band related. She pushed a coworker in a wheelchair for an extensive amount of time, hurting her knee and hips, taking a few days to recover. Pt reports no pain in the left knee .   PERTINENT HISTORY: LT ACLR  05/07/24  Patient is 4 months s/p Lt ACLR with allograft with referral to evaluate and treat for run/jog progression. She reports overall the knee is feeling pretty good. Still will get soreness in the knee. Will also feel achy in the knee with particular activities at the gym (squatting) with continued reps. She had recent f/u with surgical PA and was instructed that she can begin jogging, continue with gym, and dry needling as needed. Does not experience pain with her normal, daily activities. She tried outdoor jogging for short distances with interval walking for a total of 10 minutes and her knee felt fine. She also reports intermittent Lt hip pain.  PAIN:  Are you having pain? Yes: NPRS scale: none currently; at worst 5 Pain location: Lt knee/hip Pain description: sore Aggravating factors: overactivity, stretch, prolonged sitting Relieving factors: rest  PRECAUTIONS: Other: see protocol on file   RED FLAGS: None   WEIGHT BEARING RESTRICTIONS: No  FALLS:  Has patient fallen in last 6  months? No  LIVING ENVIRONMENT: Lives with: lives with their family Lives in: House/apartment Stairs: Yes: Internal: flight steps; on right going up and External: 3 steps; none Has following equipment at home: Crutches  OCCUPATION: umpire   PLOF: Independent  PATIENT GOALS: I need to run. Need to do sprints.   NEXT MD VISIT: January 2026   OBJECTIVE:  Note: Objective measures were completed at Evaluation unless otherwise noted.  DIAGNOSTIC FINDINGS: IMPRESSION: 1. Small vertically oriented tear extending through the superior  and inferior articular surfaces of the free edge of the root of the posterior horn of the medial meniscus. Vertically oriented tear extending through the inferior articular surface of the peripheral third of the meniscal triangle of the more medial aspect of the posterior horn of the medial meniscus. Tiny horizontal linear tear extending through the superior articular surface and the peripheral wall of the mid to anterior aspect of the body of the medial meniscus. 2. Full-thickness cartilage defect within the inferior aspect of the medial trochlea with subchondral cystic change. 3. Full-thickness cartilage loss within the mid transverse, posterior weight-bearing lateral femoral condyle extending into the posterior nonweightbearing lateral femoral condyle. Focal defect within the adjacent posterolateral tibial plateau cartilage. 4. At the distal aspect of the tibial tunnel for the ACL reconstruction, there is mild fluid bright signal bordering the medial cortex of the proximal tibial metaphysis. There is also mild-to-moderate marrow edema surrounding the distal tibial tunnel in this region. This may represent mild stress reaction.  PATIENT SURVEYS:  LEFS: 52/80  COGNITION: Overall cognitive status: Within functional limits for tasks assessed     SENSATION: Not tested  EDEMA:  Mild swelling about Lt knee     POSTURE: No Significant postural limitations  PALPATION: No assessed   LOWER EXTREMITY ROM:  Active ROM Right eval Left eval  Hip flexion    Hip extension    Hip abduction    Hip adduction    Hip internal rotation    Hip external rotation    Knee flexion  Full   Knee extension  Full   Ankle dorsiflexion    Ankle plantarflexion    Ankle inversion    Ankle eversion     (Blank rows = not tested)  LOWER EXTREMITY MMT:  MMT Right eval Left eval 09/26/24 Left  10/01/24  Hip flexion 5 5    Hip extension 5 4  Lt: 4+; Rt: 5  Hip abduction 4 4 4  Lt: 4+; Rt: 5   Hip adduction 4 4  Lt: 4+; Rt: 5  Hip internal rotation      Hip external rotation      Knee flexion 5 5    Knee extension 5 5    Ankle dorsiflexion      Ankle plantarflexion      Ankle inversion      Ankle eversion       (Blank rows = not tested)  FUNCTIONAL TESTS:  SL squat: valgus collapse LLE Bounding: rigid landing, valgus collapse LLE  GAIT: Distance walked: 10 ft  Assistive device utilized: None Level of assistance: Complete Independence Comments: no obvious gait abnormalities  [facility] Adult PT Treatment:                                                DATE: 10/16/24 Therapeutic Exercise: Side lying hip adduction with 1#  AW x10 BIL, with hip ER x10 BIL-discussed anatomy of adductor muscles  Hip flexor stretch on the table x20 sec each side -explained difference between hip flexor stretch vs quad stretch Pigeon pose for piriformis stretch 2x20 sec BIL Reviewed HEP Updated HEP  Therapeutic Activity: Standing hip abduction on slider 2x10 -for hip mobility with functional activity  Standing hip circles on slider  x10 BIL Leg press machine 115# with hip ER to target VMO muscle 2x15 Standing fire hydrants with green band 2x15 target hip abductors  [facility] Adult PT Treatment:                                                DATE: 10/01/24 Therapeutic Exercise: Treadmill 3.6 @ 3 minutes, 2.0 @ 2 minutes  Lunge hip flexor stretch x 30 sec, x 30 sec IT band Hip abduction matrix 2 x 10; 5 lbs  Hip adduction matrix 2 x 10; 5 lbs  Sidelying hip circles CW/CCW 2 x 10  Hip MMT Manual Therapy: Skilled palpation of trigger points  Trigger Point Dry Needling  Subsequent Treatment: Instructions provided previously at initial dry needling treatment.   Patient Verbal Consent Given: Yes Education Handout Provided: Previously Provided Muscles Treated: Lt vastus lateralis, bicep femoris, gluteals  Electrical Stimulation Performed: No Treatment Response/Outcome: twitch response elicited      Therapeutic Activity: Forward bounding x 10 each   [facility] Adult PT Treatment:                                                DATE: 09/26/24 Therapeutic Exercise: Reviewed HEP discussing which exercises to prioritize for glute strength  Manual Therapy: Skilled palpation of trigger points Trigger Point Dry Needling  Subsequent Treatment: Instructions provided previously at initial dry needling treatment.   Patient Verbal Consent Given: Yes Education Handout Provided: Previously Provided Muscles Treated: Lt vastus lateralis, rectus femoris, gluteals, piriformis  Electrical Stimulation Performed: No Treatment Response/Outcome: twitch response elicited   Self Care: Reviewed and printed post-operative independent gym program discussing frequency and appropriate rest  Discussed running progression which includes only changing 1 variable at a time (distance, speed, or terrain). Recommended that she continue same distance on level surface and can minimally increase pace to 11 minute mile pace.     PATIENT EDUCATION:  Education details: See treatment  Person educated: Patient Education method: Explanation Education comprehension: verbalized understanding  HOME EXERCISE PROGRAM: Access Code: [email or web address] URL: [email or web address] Date: 10/16/2024 Prepared by: Darice Conine Exercises - Supine Hamstring Stretch with Strap  - 1 x daily - 3 x weekly - 3 sets - 30 sec  hold - Standing Single Leg Heel Raise  - 1 x daily - 3 x weekly - 2 sets - 10 reps - Side Stepping with Resistance at Ankles  - 1 x daily - 3 x weekly - 2 sets - 10 reps - Clamshell with Resistance  - 1 x daily - 3 x weekly - 2 sets - 10 reps - Supine Hamstring Curl on Swiss Ball  - 1 x daily - 3 x weekly - 2 sets - 10 reps - Forward Step Down  - 1 x daily - 3 x weekly - 2 sets - 10 reps - Single  Leg Stance  - 1 x daily - 3 x weekly - 3 sets - 30 sec  hold - Sidelying Hip Circles  - 1 x daily - 3 x weekly - 3 sets - 10  reps - Standing Hip Flexor Stretch with Foot Elevated  - 1 x daily - 7 x weekly - 1 sets - 3 reps - 20-30 sec hold - Pigeon Pose  - 1 x daily - 7 x weekly - 1 sets - 3 reps - 20-30 sec hold - Standing Hip Abduction with Bent Knee  - 1 x daily - 7 x weekly - 3 sets - 10 reps   ASSESSMENT:  CLINICAL IMPRESSION:  Pt reports no Left knee pain, only pain in bilateral hips. Denied dry needling today. Focused on stretches and mobility exercises to promote increased hip ROM and decrease hip pain. Incorporated exercises targeting VMO muscle for improving knee stability. Required verbal and tactile cues for side lying adductor leg raise to realign hips forward and position non moving leg in optimal position for moving leg to raise. Challenged with lateral side sliders in keeping knees in front of toes for proper mechanics. Tolerated session well without adverse reactions.    OBJECTIVE IMPAIRMENTS: decreased activity tolerance, decreased balance, decreased coordination, decreased endurance, decreased knowledge of condition, decreased strength, increased edema, increased fascial restrictions, impaired flexibility, improper body mechanics, and pain.     GOALS: Goals reviewed with patient? Yes  SHORT TERM GOALS: Target date: 10/10/2024   Patient will be independent and compliant with initial HEP.   Baseline: initial HEP issued  Goal status: MET  2.  Patient will demonstrate normalized mechanics with forward bounding to reduce stress on knee with running activity.  Baseline: see above Goal status: progressing   3.  Patient will demonstrate 5/5 hip strength to improve stability about the chain with running/jumping activity.  Baseline: see above Goal status: partially met    LONG TERM GOALS: Target date: 11/10/2024    Patient will score >/= 65/80 on the LEFS (MCID is 9) to signify clinically meaningful improvement in functional abilities.   Baseline: see above Goal status: INITIAL  2.   Patient will be able to sprint at least 40 ft with minimal/no knee pain.  Baseline: unable  Goal status: INITIAL  3.   Patient will demonstrate normalized SL squat mechanics to improve knee stability.  Baseline: see above Goal status: INITIAL  4.  Patient will be able to complete 1 mile jog with minimal/no knee pain.  Baseline: unable  Goal status: INITIAL     PLAN:  PT FREQUENCY: 1x/week  PT DURATION: 8 weeks  PLANNED INTERVENTIONS: 02835- PT Re-evaluation, 97750- Physical Performance Testing, 97110-Therapeutic exercises, 97530- Therapeutic activity, [id number]- Neuromuscular re-education, 97535- Self Care, 02859- Manual therapy, [id number]- Gait training, [phone]- Aquatic Therapy, [phone]- Electrical stimulation (unattended), [phone]- Electrical stimulation (manual), [id number]- Vasopneumatic device, [id number]- Ionotophoresis 4mg /ml Dexamethasone, [id number] (1-2 muscles), [id number] (3+ muscles)- Dry Needling, Patient/Family education, Cryotherapy, and Moist heat  PLAN FOR NEXT SESSION: progress per protocol;assess response to dry needling, hip strengthening; CAN PROGRESS TO SPRINT AT 5 MONTHS, follow up with how stretches went? Hip flexor diff from quad stretch?  Lavanda Cleverly, SPT 10/16/24 4:28 PM

## 2024-10-22 ENCOUNTER — Other Ambulatory Visit: Payer: Self-pay | Admitting: Sports Medicine

## 2024-10-22 ENCOUNTER — Ambulatory Visit: Attending: Orthopaedic Surgery

## 2024-10-22 DIAGNOSIS — M6281 Muscle weakness (generalized): Secondary | ICD-10-CM | POA: Diagnosis present

## 2024-10-22 DIAGNOSIS — G8929 Other chronic pain: Secondary | ICD-10-CM

## 2024-10-22 DIAGNOSIS — R29898 Other symptoms and signs involving the musculoskeletal system: Secondary | ICD-10-CM | POA: Insufficient documentation

## 2024-10-22 DIAGNOSIS — M25562 Pain in left knee: Secondary | ICD-10-CM | POA: Diagnosis present

## 2024-10-22 DIAGNOSIS — M25552 Pain in left hip: Secondary | ICD-10-CM | POA: Diagnosis present

## 2024-10-22 NOTE — Therapy (Addendum)
 OUTPATIENT PHYSICAL THERAPY LOWER EXTREMITY TREATMENT PHYSICAL THERAPY DISCHARGE SUMMARY  Visits from Start of Care: 6  Current functional level related to goals / functional outcomes: See below   Remaining deficits: See below   Education / Equipment: See below   Patient agrees to discharge. Patient goals were partially met. Patient is being discharged due to independent with current home program.   Patient Name: Whitney Turner MRN: [id number] DOB:1976-08-07, 48 y.o., female Today's Date: 10/22/2024  END OF SESSION:  PT End of Session - 10/22/24 0854     Visit Number 6    Number of Visits 9    Date for Recertification  11/10/24    Authorization Type [employer]    Authorization - Visit Number 18    Authorization - Number of Visits 20    PT Start Time 0851    PT Stop Time 0930    PT Time Calculation (min) 39 min    Activity Tolerance Patient tolerated treatment well    Behavior During Therapy [facility] for tasks assessed/performed               Past Medical History:  Diagnosis Date   Allergy    Heart murmur    Herpes    Past Surgical History:  Procedure Laterality Date   HERNIA REPAIR  1982   KNEE SURGERY  08/2011   TONSILECTOMY/ADENOIDECTOMY WITH MYRINGOTOMY  2000   Patient Active Problem List   Diagnosis Date Noted   Abnormal weight gain 03/05/2024   Chronic left hip pain 02/27/2024   Tibialis posterior tendinitis, left 03/10/2023   Mouth ulcer 03/08/2023   Sore throat 03/08/2023   Poor concentration 12/30/2020   Hyperactive behavior 12/30/2020   Chronic left shoulder pain 11/17/2020   Elevated LDL cholesterol level 12/24/2019   Right lower quadrant pain 12/24/2019   Right upper quadrant pain 12/24/2019   Effusion of right knee 06/25/2019   Hemorrhoids 12/04/2018   CIN I (cervical intraepithelial neoplasia I) 04/03/2018   Lateral epicondylitis of left elbow 02/15/2018   Left hand pain 02/23/2016   History of repair of ACL of Left knee 01/26/2016   Plantar  fasciitis, bilateral 12/29/2015   Genital herpes 12/01/2012   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/2012   Allergic rhinitis 12/01/2012   Seasonal allergies 12/01/2012    PCP: Antoniette Vermell LITTIE DEVONNA  REFERRING PROVIDER: Aleck Stalling, PA  REFERRING DIAG: other specified postprocedural status; jog/run program s/p Lt ACLR  THERAPY DIAG:  Chronic pain of left knee  Muscle weakness (generalized)  Rationale for Evaluation and Treatment: Rehabilitation  ONSET DATE: 05/07/24  SUBJECTIVE:   SUBJECTIVE STATEMENT: Ran 11 min pace on Fri, then 15 min pace Sun, stating her knee feels tight but not painful during running. Denies using ice. Reports going to gym after last session perforrning stationary bike 15 min, hamstring curls, leg press. No adverse reactions in knee, however Gets left hip pain, esp during sitting. The hip pain has not really changed with HEP.   PERTINENT HISTORY: LT ACLR  05/07/24  Patient is 4 months s/p Lt ACLR with allograft with referral to evaluate and treat for run/jog progression. She reports overall the knee is feeling pretty good. Still will get soreness in the knee. Will also feel achy in the knee with particular activities at the gym (squatting) with continued reps. She had recent f/u with surgical PA and was instructed that she can begin jogging, continue with gym, and dry needling as needed. Does not experience pain with her normal, daily activities.  She tried outdoor jogging for short distances with interval walking for a total of 10 minutes and her knee felt fine. She also reports intermittent Lt hip pain.  PAIN:  Are you having pain? Yes: NPRS scale: none currently; at worst 5 Pain location: Lt knee/hip Pain description: sore Aggravating factors: overactivity, stretch, prolonged sitting Relieving factors: rest  PRECAUTIONS: Other: see protocol on file   RED FLAGS: None   WEIGHT BEARING RESTRICTIONS: No  FALLS:  Has patient fallen in last 6 months? No  LIVING  ENVIRONMENT: Lives with: lives with their family Lives in: House/apartment Stairs: Yes: Internal: flight steps; on right going up and External: 3 steps; none Has following equipment at home: Crutches  OCCUPATION: umpire   PLOF: Independent  PATIENT GOALS: I need to run. Need to do sprints.   NEXT MD VISIT: January 2026   OBJECTIVE:  Note: Objective measures were completed at Evaluation unless otherwise noted.  DIAGNOSTIC FINDINGS: IMPRESSION: 1. Small vertically oriented tear extending through the superior and inferior articular surfaces of the free edge of the root of the posterior horn of the medial meniscus. Vertically oriented tear extending through the inferior articular surface of the peripheral third of the meniscal triangle of the more medial aspect of the posterior horn of the medial meniscus. Tiny horizontal linear tear extending through the superior articular surface and the peripheral wall of the mid to anterior aspect of the body of the medial meniscus. 2. Full-thickness cartilage defect within the inferior aspect of the medial trochlea with subchondral cystic change. 3. Full-thickness cartilage loss within the mid transverse, posterior weight-bearing lateral femoral condyle extending into the posterior nonweightbearing lateral femoral condyle. Focal defect within the adjacent posterolateral tibial plateau cartilage. 4. At the distal aspect of the tibial tunnel for the ACL reconstruction, there is mild fluid bright signal bordering the medial cortex of the proximal tibial metaphysis. There is also mild-to-moderate marrow edema surrounding the distal tibial tunnel in this region. This may represent mild stress reaction.  PATIENT SURVEYS:  LEFS: 52/80 10/22/24: LEFS: 71/80  COGNITION: Overall cognitive status: Within functional limits for tasks assessed     SENSATION: Not tested  EDEMA:  Mild swelling about Lt knee     POSTURE: No Significant postural  limitations  PALPATION: No assessed   LOWER EXTREMITY ROM:  Active ROM Right eval Left eval  Hip flexion    Hip extension    Hip abduction    Hip adduction    Hip internal rotation    Hip external rotation    Knee flexion  Full   Knee extension  Full   Ankle dorsiflexion    Ankle plantarflexion    Ankle inversion    Ankle eversion     (Blank rows = not tested)  LOWER EXTREMITY MMT:  MMT Right eval Left eval 09/26/24 Left  10/01/24 10/22/24 Left  Hip flexion 5 5     Hip extension 5 4  Lt: 4+; Rt: 5 5  Hip abduction 4 4 4  Lt: 4+; Rt: 5 5  Hip adduction 4 4  Lt: 4+; Rt: 5 4+  Hip internal rotation       Hip external rotation       Knee flexion 5 5     Knee extension 5 5     Ankle dorsiflexion       Ankle plantarflexion       Ankle inversion       Ankle eversion        (  Blank rows = not tested)  FUNCTIONAL TESTS:  SL squat: valgus collapse LLE Bounding: rigid landing, valgus collapse LLE  GAIT: Distance walked: 10 ft  Assistive device utilized: None Level of assistance: Complete Independence Comments: no obvious gait abnormalities   [facility] Adult PT Treatment:                                                DATE: 10/22/24 Therapeutic Exercise: Treadmill at 2.8 mph for 6 min- SPT present to discuss pt status Reassessed LEFS see below Reviewed HEP Updated HEP  Therapeutic Activity: Reassessment to determine overall functional progress, educating patient on progress towards goals  Self Care: Discussed hip anatomy, addressing hip pain Recommended pt follow up with orthopedic doctor for ongoing left hip pain  Explained pt protocol and provided print out  [facility] Adult PT Treatment:                                                DATE: 10/16/24 Therapeutic Exercise: Side lying hip adduction with 1# AW x10 BIL, with hip ER x10 BIL-discussed anatomy of adductor muscles  Hip flexor stretch on the table x20 sec each side -explained difference between hip flexor stretch  vs quad stretch Pigeon pose for piriformis stretch 2x20 sec BIL Reviewed HEP Updated HEP  Therapeutic Activity: Standing hip abduction on slider 2x10 -for hip mobility with functional activity  Standing hip circles on slider  x10 BIL Leg press machine 115# with hip ER to target VMO muscle 2x15 Standing fire hydrants with green band 2x15 target hip abductors  [facility] Adult PT Treatment:                                                DATE: 10/01/24 Therapeutic Exercise: Treadmill 3.6 @ 3 minutes, 2.0 @ 2 minutes  Lunge hip flexor stretch x 30 sec, x 30 sec IT band Hip abduction matrix 2 x 10; 5 lbs  Hip adduction matrix 2 x 10; 5 lbs  Sidelying hip circles CW/CCW 2 x 10  Hip MMT Manual Therapy: Skilled palpation of trigger points  Trigger Point Dry Needling  Subsequent Treatment: Instructions provided previously at initial dry needling treatment.   Patient Verbal Consent Given: Yes Education Handout Provided: Previously Provided Muscles Treated: Lt vastus lateralis, bicep femoris, gluteals  Electrical Stimulation Performed: No Treatment Response/Outcome: twitch response elicited     Therapeutic Activity: Forward bounding x 10 each     PATIENT EDUCATION:  Education details: See treatment  Person educated: Patient Education method: Explanation handout  Education comprehension: verbalized understanding  HOME EXERCISE PROGRAM: Access Code: [email or web address] URL: [email or web address] Date: 10/22/2024 Prepared by: Lucie Meeter  Exercises - Supine Hamstring Stretch with Strap  - 1 x daily - 7 x weekly - 3 sets - 30 sec  hold - Pigeon Pose  - 1 x daily - 7 x weekly - 1 sets - 3 reps - 20-30 sec hold - Standing Hip Flexor Stretch with Foot Elevated  - 1 x daily - 7 x weekly - 1 sets - 3 reps - 20-30 sec hold -  Standing Single Leg Heel Raise  - 1 x daily - 3 x weekly - 2 sets - 10 reps - Side Stepping with Resistance at Ankles  - 1 x daily - 3 x weekly - 2 sets - 10  reps - Clamshell with Resistance  - 1 x daily - 3 x weekly - 2 sets - 10 reps - Supine Hamstring Curl on Swiss Ball  - 1 x daily - 3 x weekly - 2 sets - 10 reps - Forward Step Down  - 1 x daily - 3 x weekly - 2 sets - 10 reps - Single Leg Stance  - 1 x daily - 3 x weekly - 3 sets - 30 sec  hold - Sidelying Hip Circles  - 1 x daily - 3 x weekly - 3 sets - 10 reps - Standing Hip Abduction with Bent Knee  - 1 x daily - 3 x weekly - 2 sets - 10 reps - Single-Leg Quarter Squat   - 1 x daily - 3 x weekly - 2 sets - 10 reps - Single-Leg Romanian Deadlift With Kettlebell  - 1 x daily - 3 x weekly - 2 sets - 10 reps - Single Leg Balance with Alternating Floor Reaches  - 1 x daily - 3 x weekly - 2 sets - 10 reps - Single Leg Lunge with Foot on Bench  - 1 x daily - 3 x weekly - 2 sets - 10 reps  ASSESSMENT:  CLINICAL IMPRESSION: Upon reassessment, pt has met all STG, and all LTG apart from achieving 5/5 MMT for hip adductor strengthening. Pt no longer presents with knee valgus collapse during CKC strengthening. Pt sprinted over 100 ft without knee pain, and performed normal mechanics for single limb squat. Although pt's left knee is better, and pain free, she reports ongoing left hip pain. Thus recommended she follow up with doctor regarding this ongoing complaint. Pt complies and is making plans for a follow up appointment. Pt has met majority of goals, is independent with HEP that is appropriate for this phase of her post-operative protocol, and is appropriate candidate for discharge at this time. Also recommended pt continue hip strengthening exercises in HEP to promote improved hip stability for functional activities required for work. We reviewed current restrictions at this stage of rehabilitation with recommendation to discuss clearance for higher level activity (jumping, pivoting, cutting, etc.) at f/u with Dr. Cristy with patient verbalizing understanding. She is therefore appropriate for discharge at  this time with patient in agreement with this plan.    OBJECTIVE IMPAIRMENTS: decreased activity tolerance, decreased balance, decreased coordination, decreased endurance, decreased knowledge of condition, decreased strength, increased edema, increased fascial restrictions, impaired flexibility, improper body mechanics, and pain.     GOALS: Goals reviewed with patient? Yes  SHORT TERM GOALS: Target date: 10/10/2024   Patient will be independent and compliant with initial HEP.   Baseline: initial HEP issued  Goal status: MET  2.  Patient will demonstrate normalized mechanics with forward bounding to reduce stress on knee with running activity.  Baseline: see above Goal status: MET   3.  Patient will demonstrate 5/5 hip strength to improve stability about the chain with running/jumping activity.  Baseline: see above Goal status: partially met still 4+/5 LLE adduction    LONG TERM GOALS: Target date: 11/10/2024    Patient will score >/= 65/80 on the LEFS (MCID is 9) to signify clinically meaningful improvement in functional abilities.   Baseline: 52/80 Goal  status: MET 88.8% (71/80)  2.  Patient will be able to sprint at least 40 ft with minimal/no knee pain.  Baseline: unable  10/22/24: sprint x 100 ft, no knee pain  Goal status: MET 10/22/24  3.   Patient will demonstrate normalized SL squat mechanics to improve knee stability.  Baseline: see above 10/22/24: normalized SL squat  Goal status: Met   4.  Patient will be able to complete 1 mile jog with minimal/no knee pain.  Baseline: unable  Goal status: MET 10/22/24     PLAN:  PT FREQUENCY: 1x/week  PT DURATION: 8 weeks  PLANNED INTERVENTIONS: 97164- PT Re-evaluation, 97750- Physical Performance Testing, 97110-Therapeutic exercises, 97530- Therapeutic activity, 97112- Neuromuscular re-education, 97535- Self Care, 02859- Manual therapy, [phone]- Gait training, [phone]- Aquatic Therapy, [phone]- Electrical stimulation  (unattended), [phone]- Electrical stimulation (manual), [id number]- Vasopneumatic device, [id number]- Ionotophoresis 4mg /ml Dexamethasone, 79439 (1-2 muscles), [id number] (3+ muscles)- Dry Needling, Patient/Family education, Cryotherapy, and Moist heat    Lavanda Cleverly, SPT 10/22/24 1:09 PM  Lucie Meeter, PT, DPT, ATC 10/22/24 1:50 PM

## 2024-11-02 NOTE — Therapy (Signed)
 OUTPATIENT PHYSICAL THERAPY LOWER EXTREMITY EVALUATION   Patient Name: Aalaiyah Yassin MRN: [id number] DOB:01-Apr-1976, 48 y.o., female Today's Date: 11/05/2024  END OF SESSION:  PT End of Session - 11/05/24 0928     Visit Number 1    Number of Visits 17    Date for Recertification  01/05/25    Authorization Type [employer]    Authorization - Visit Number 20    Authorization - Number of Visits 20    PT Start Time 0930    PT Stop Time 1015    PT Time Calculation (min) 45 min    Activity Tolerance Patient tolerated treatment well    Behavior During Therapy [facility] for tasks assessed/performed          Past Medical History:  Diagnosis Date   Allergy    Heart murmur    Herpes    Past Surgical History:  Procedure Laterality Date   HERNIA REPAIR  1982   KNEE SURGERY  08/2011   TONSILECTOMY/ADENOIDECTOMY WITH MYRINGOTOMY  2000   Patient Active Problem List   Diagnosis Date Noted   Abnormal weight gain 03/05/2024   Chronic left hip pain 02/27/2024   Tibialis posterior tendinitis, left 03/10/2023   Mouth ulcer 03/08/2023   Sore throat 03/08/2023   Poor concentration 12/30/2020   Hyperactive behavior 12/30/2020   Chronic left shoulder pain 11/17/2020   Elevated LDL cholesterol level 12/24/2019   Right lower quadrant pain 12/24/2019   Right upper quadrant pain 12/24/2019   Effusion of right knee 06/25/2019   Hemorrhoids 12/04/2018   CIN I (cervical intraepithelial neoplasia I) 04/03/2018   Lateral epicondylitis of left elbow 02/15/2018   Left hand pain 02/23/2016   History of repair of ACL of Left knee 01/26/2016   Plantar fasciitis, bilateral 12/29/2015   Genital herpes 12/01/2012   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 Continues to notice pain if she is sitting in a soft chair or in a more slumped position. Overall her left knee is going well s/p Lt ACLR on 05/07/24, but the hip pain overall has not changed since onset. At recent ortho f/u she was ordered an MRI of the hip and lumbar spine, but has not had these yet. The pain is mostly along the lateral hip, but more recently (within the past month) is having pain along the Lt buttock. No recent numbness or tingling. No popping/clicking in the hip. Does occasionally feel instances of instability in the hip. Is unsure if the pain is deep or superficial. Gets short term relief with TPDN and massage. She reports history of chronic low back pain that has been ongoing for years. With squatting she still feels that she favors the Lt side.   PERTINENT HISTORY: Lt ACLR 05/07/24 PAIN:  Are you having pain? Yes: NPRS scale: 7 Pain location: Lt lateral/posterior hip Pain description: aggravating, feels like nerve  pain  Aggravating factors: sitting, leg press, running Relieving factors: standing, walking  PRECAUTIONS: Other: no jumping, cutting, higher level activity secondary to ACLR  RED FLAGS: None   WEIGHT BEARING RESTRICTIONS: No  FALLS:  Has patient fallen in last 6 months? No  LIVING ENVIRONMENT: Lives with: lives with their family Lives in: House/apartment Stairs: Yes: Internal: flight steps; on right going up and External: 3 steps; none Has following equipment at home: Crutches  OCCUPATION: umpire  PLOF: Independent  PATIENT GOALS: I want to get out of pain and get  stronger.   NEXT MD VISIT: January 2026  OBJECTIVE:  Note: Objective measures were completed at Evaluation unless otherwise noted.  DIAGNOSTIC FINDINGS:  Lt hip X-ray: IMPRESSION: No acute abnormality noted.  PATIENT SURVEYS:   LEFS: 55/80  COGNITION: Overall cognitive status: Within functional limits for tasks assessed     SENSATION: Not tested  EDEMA:  No obvious swelling about the hip   MUSCLE LENGTH: Hamstrings: Full bilateral   POSTURE: anterior pelvic tilt  PALPATION: TTP Lt greater trochanter, gluteals, IT band Hypermobility with PAIVM lumbar spine with pain along L4-L5  LUMBAR ROM:   Active  A/PROM  eval  Flexion Full pain in Lt buttock  Extension Full   Right lateral flexion WNL  Left lateral flexion WNL  Right rotation WNL  Left rotation WNL   (Blank rows = not tested)  LOWER EXTREMITY ROM:  Active ROM Right eval Left eval  Hip flexion  WFL  Hip extension    Hip abduction    Hip adduction    Hip internal rotation  WFL  Hip external rotation  [facility]  Knee flexion    Knee extension    Ankle dorsiflexion    Ankle plantarflexion    Ankle inversion    Ankle eversion     (Blank rows = not tested)  LOWER EXTREMITY MMT:  MMT Right eval Left eval  Hip flexion 4+ 4  Hip extension 4+ 4+   Hip abduction 4+ 4  Hip adduction 4 4  Hip internal rotation  5  Hip external rotation  4+ pain   Knee flexion    Knee extension    Ankle dorsiflexion    Ankle plantarflexion    Ankle inversion    Ankle eversion     (Blank rows = not tested)  LOWER EXTREMITY SPECIAL TESTS:  (+ pain) FABER, FADIR, and hip scour   (-) SLR, sacral thrust, thigh thrust, Gaeslen's, SI compression, distraction   FUNCTIONAL TESTS:  Not tested   GAIT: Distance walked: 10 ft  Assistive device utilized: None Level of assistance: Complete Independence Comments: no obvious gait abnormalities                                                                                                                                  [facility] Adult PT Treatment:  DATE: 11/05/24 Therapeutic Exercise: Demonstrated,performed, and issued initial HEP. Review of ACLR HEP to continue for hip strengthening.  Standing lumbar extension- increased posterior hip pain Seated trunk flexion- no effect       PATIENT EDUCATION:  Education details: see treatment; POC Person educated: Patient Education method: Explanation, Demonstration, Tactile cues, Verbal cues, and Handouts Education comprehension: verbalized understanding, returned demonstration, verbal cues required, tactile cues required, and needs further education  HOME EXERCISE PROGRAM: Access Code: [email or web address] URL: [email or web address] Date: 11/05/2024 Prepared by: Lucie Meeter  Exercises - Supine March  - 1 x daily - 7 x weekly - 2 sets - 10 reps - Supine Pelvic Tilt with Straight Leg Raise  - 1 x daily - 7 x weekly - 2 sets - 10 reps - Quadruped Alternating Leg Extensions  - 1 x daily - 7 x weekly - 2 sets - 10 reps  ASSESSMENT:  CLINICAL IMPRESSION: Patient is a 48 y.o. female who was seen today for physical therapy evaluation and treatment for chronic left hip pain. She reports onset of hip pain began in February when she started cycling more. Overall her hip pain has stayed the same even following Lt ACLR in June. Upon assessment she is noted to have functional and pain free hip ROM, normal lumbar AROM though posterior hip pain elicited with trunk flexion, a positive FABER, FADIR, and hip scour, other lumbar/SI joint special tests are negative, bilateral hip weakness, and lumbar hypermobility. No centralization of symptoms with repeated trunk flexion and extension, though LAD of the LLE reduced her pain. Initial HEP was issued to include core stabilization as well as reviewing previously prescribed HEP for ACLR rehab that includes hip strengthening to continue with  patient verbalizing understanding. She will benefit from skilled PT to address the above stated deficits in order to optimize her function and assist in overall pain reduction.    OBJECTIVE IMPAIRMENTS: decreased activity tolerance, decreased endurance, decreased knowledge of condition, decreased strength, improper body mechanics, and pain.   ACTIVITY LIMITATIONS: carrying, lifting, bending, sitting, squatting, and bed mobility  PARTICIPATION LIMITATIONS: cleaning, driving, shopping, community activity, and occupation  PERSONAL FACTORS: Age, Fitness, Past/current experiences, Profession, Time since onset of injury/illness/exacerbation, and 1 comorbidity: ACLR June 2025 are also affecting patient's functional outcome.   REHAB POTENTIAL: Good  CLINICAL DECISION MAKING: Stable/uncomplicated  EVALUATION COMPLEXITY: Low   GOALS: Goals reviewed with patient? Yes  SHORT TERM GOALS: Target date: 12/03/2024   Patient will be independent and compliant with initial HEP.   Baseline: initial HEP issued Goal status: INITIAL  2.  Patient will report </= 5/10 Lt hip pain to reduce current functional limitations.  Baseline: 7  Goal status: INITIAL  3.  Patient will squat with equal weight-bearing without onset of hip pain.   Baseline: reports favoring the LLE due to pain/weakness.  Goal status: INITIAL   LONG TERM GOALS: Target date: 01/05/25  Patient will score >/= 70/80 on the LEFS (MCID is 9) to signify clinically meaningful improvement in functional abilities.   Baseline: see above Goal status: INITIAL  2.  Patient will demonstrate 5/5 bilateral hip strength to improve stability about the chain with running activity.  Baseline: see above Goal status: INITIAL  3.  Patient will report Lt hip pain at worst rated as </= 2/10 to improve her tolerance to sitting activity.  Baseline: 7  Goal status: INITIAL  4.  Patient will be independent with advanced home program to progress/maintain  current level of function.  Baseline:  initial HEP issued  Goal status: INITIAL    PLAN:  PT FREQUENCY: 1-2x/week  PT DURATION: 8 weeks  PLANNED INTERVENTIONS: 97164- PT Re-evaluation, 97750- Physical Performance Testing, 97110-Therapeutic exercises, 97530- Therapeutic activity, [id number]- Neuromuscular re-education, 97535- Self Care, [id number]- Manual therapy, [id number]- Gait training, [phone]- Aquatic Therapy, [phone]- Electrical stimulation (unattended), [id number]- Electrical stimulation (manual), [id number]- Traction (mechanical), 20560 (1-2 muscles), 20561 (3+ muscles)- Dry Needling, Patient/Family education, Cryotherapy, and Moist heat  PLAN FOR NEXT SESSION: LAD, consider traction, core stabilization.   Emmons Toth, PT, DPT, ATC 11/05/2024 10:39 AM

## 2024-11-05 ENCOUNTER — Ambulatory Visit

## 2024-11-05 ENCOUNTER — Other Ambulatory Visit: Payer: Self-pay

## 2024-11-05 DIAGNOSIS — M25552 Pain in left hip: Secondary | ICD-10-CM

## 2024-11-05 DIAGNOSIS — M25562 Pain in left knee: Secondary | ICD-10-CM | POA: Diagnosis not present

## 2024-11-05 DIAGNOSIS — R29898 Other symptoms and signs involving the musculoskeletal system: Secondary | ICD-10-CM

## 2024-11-05 DIAGNOSIS — M6281 Muscle weakness (generalized): Secondary | ICD-10-CM

## 2024-11-07 ENCOUNTER — Ambulatory Visit: Admitting: Medical-Surgical

## 2024-11-07 ENCOUNTER — Encounter: Payer: Self-pay | Admitting: Medical-Surgical

## 2024-11-07 VITALS — BP 119/75 | HR 73 | Temp 98.3°F | Resp 20 | Ht 63.0 in | Wt 128.0 lb

## 2024-11-07 DIAGNOSIS — J029 Acute pharyngitis, unspecified: Secondary | ICD-10-CM | POA: Diagnosis not present

## 2024-11-07 LAB — POCT RAPID STREP A (OFFICE): Rapid Strep A Screen: NEGATIVE

## 2024-11-07 NOTE — Progress Notes (Unsigned)
° °  Acute Office Visit  Subjective:     Patient ID: Whitney Turner, female    DOB: 03/19/1976, 48 y.o.   MRN: [id number]  Chief Complaint  Patient presents with   Sore Throat    Coworker tested Positive for Strep Sore throat started Sunday Night Flew on a Red eye thought it might be from that since she is having headaches, body aches, and fatigue.   Headache   Generalized Body Aches   Fatigue    Sore Throat   Headache  Associated symptoms include a sore throat.   Patient is in today for sore throat that started in Saturday night into Sunday morning after flying home on a late flight. Sunday night and Monday night she noticed having some generalized body aches, headache, fatigue, and nasal congestion. States that symptoms were very mild and associated symptoms with being tired from late flight. States that a coworker tested positive for Strep yesterday morning. Symptoms started to improve Tuesday. Denies cough, runny nose, fever, or chills.  Review of Systems  Constitutional: Negative.   HENT:  Positive for sore throat.   Eyes: Negative.   Respiratory: Negative.    Cardiovascular: Negative.   Gastrointestinal: Negative.   Genitourinary: Negative.   Musculoskeletal: Negative.   Skin: Negative.   Neurological: Negative.   Endo/Heme/Allergies: Negative.   Psychiatric/Behavioral: Negative.          Objective:    BP 119/75 (BP Location: Right Arm, Cuff Size: Small)   Pulse 73   Temp 98.3 F (36.8 C) (Oral)   Resp 20   Ht 5' 3 (1.6 m)   Wt 58.1 kg   SpO2 100%   BMI 22.67 kg/m  BP Readings from Last 3 Encounters:  11/07/24 119/75  08/22/24 98/71  04/02/24 97/66      Physical Exam Vitals and nursing note reviewed.  Constitutional:      General: She is not in acute distress.    Appearance: Normal appearance.  HENT:     Right Ear: Tympanic membrane normal.     Left Ear: Tympanic membrane normal.     Nose: No congestion or rhinorrhea.     Mouth/Throat:      Mouth: Mucous membranes are moist.     Pharynx: Oropharynx is clear.  Cardiovascular:     Rate and Rhythm: Normal rate and regular rhythm.     Pulses: Normal pulses.     Heart sounds: Normal heart sounds.  Pulmonary:     Effort: Pulmonary effort is normal.     Breath sounds: Normal breath sounds.  Neurological:     General: No focal deficit present.     Mental Status: She is alert and oriented to person, place, and time.  Psychiatric:        Mood and Affect: Mood normal.        Behavior: Behavior normal.        Thought Content: Thought content normal.        Judgment: Judgment normal.     No results found for any visits on 11/07/24.      Assessment & Plan:   1. Sore throat (Primary) -POCT Strep test- Negative -Recommended conservative treatment options for symptoms such as salt water gargles, throat lozenges, and tylenol     Return if symptoms worsen or fail to improve.  Derrek JINNY Freund, NP Student

## 2024-11-08 ENCOUNTER — Other Ambulatory Visit: Payer: Self-pay | Admitting: Sports Medicine

## 2024-11-08 ENCOUNTER — Encounter: Payer: Self-pay | Admitting: Medical-Surgical

## 2024-11-08 DIAGNOSIS — M25552 Pain in left hip: Secondary | ICD-10-CM

## 2024-11-08 DIAGNOSIS — M5459 Other low back pain: Secondary | ICD-10-CM

## 2024-11-08 NOTE — Progress Notes (Signed)
 Medical screening examination/treatment was performed by qualified clinical staff member and as supervising provider I was immediately available for consultation/collaboration. I have reviewed documentation and agree with assessment and plan.  Thayer Ohm, DNP, APRN, FNP-BC [facility]

## 2024-11-11 ENCOUNTER — Other Ambulatory Visit

## 2024-11-11 ENCOUNTER — Ambulatory Visit

## 2024-11-11 DIAGNOSIS — M5459 Other low back pain: Secondary | ICD-10-CM

## 2024-11-11 DIAGNOSIS — M25552 Pain in left hip: Secondary | ICD-10-CM | POA: Diagnosis not present

## 2024-11-11 DIAGNOSIS — M545 Low back pain, unspecified: Secondary | ICD-10-CM

## 2024-11-19 ENCOUNTER — Other Ambulatory Visit: Payer: Self-pay | Admitting: Medical Genetics

## 2024-11-19 ENCOUNTER — Other Ambulatory Visit: Payer: Self-pay | Admitting: Family Medicine

## 2024-11-19 DIAGNOSIS — M7542 Impingement syndrome of left shoulder: Secondary | ICD-10-CM

## 2024-11-23 ENCOUNTER — Ambulatory Visit: Attending: Sports Medicine

## 2024-11-23 DIAGNOSIS — M25552 Pain in left hip: Secondary | ICD-10-CM | POA: Insufficient documentation

## 2024-11-23 DIAGNOSIS — M6281 Muscle weakness (generalized): Secondary | ICD-10-CM | POA: Insufficient documentation

## 2024-11-23 DIAGNOSIS — R29898 Other symptoms and signs involving the musculoskeletal system: Secondary | ICD-10-CM | POA: Insufficient documentation

## 2024-11-23 NOTE — Therapy (Signed)
 " OUTPATIENT PHYSICAL THERAPY LOWER EXTREMITY TREATMENT   Patient Name: Whitney Turner MRN: [id number] DOB:June 01, 1976, 49 y.o., female Today's Date: 11/23/2024  END OF SESSION:  PT End of Session - 11/23/24 0758     Visit Number 2    Number of Visits 17    Date for Recertification  01/05/25    Authorization Type [employer]    Authorization - Visit Number 1    Authorization - Number of Visits 20    PT Start Time 0800    PT Stop Time 0844    PT Time Calculation (min) 44 min    Activity Tolerance Patient tolerated treatment well    Behavior During Therapy [facility] for tasks assessed/performed           Past Medical History:  Diagnosis Date   Allergy    Heart murmur    Herpes    Past Surgical History:  Procedure Laterality Date   HERNIA REPAIR  1982   KNEE SURGERY  08/2011   TONSILECTOMY/ADENOIDECTOMY WITH MYRINGOTOMY  2000   Patient Active Problem List   Diagnosis Date Noted   Abnormal weight gain 03/05/2024   Chronic left hip pain 02/27/2024   Tibialis posterior tendinitis, left 03/10/2023   Mouth ulcer 03/08/2023   Sore throat 03/08/2023   Poor concentration 12/30/2020   Hyperactive behavior 12/30/2020   Chronic left shoulder pain 11/17/2020   Elevated LDL cholesterol level 12/24/2019   Right lower quadrant pain 12/24/2019   Right upper quadrant pain 12/24/2019   Effusion of right knee 06/25/2019   Hemorrhoids 12/04/2018   CIN I (cervical intraepithelial neoplasia I) 04/03/2018   Lateral epicondylitis of left elbow 02/15/2018   Left hand pain 02/23/2016   History of repair of ACL of Left knee 01/26/2016   Plantar fasciitis, bilateral 12/29/2015   Genital herpes 12/01/2012   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/2012   Allergic rhinitis 12/01/2012   Seasonal allergies 12/01/2012    PCP: Antoniette Vermell CROME, PA-C  REFERRING PROVIDER:   Curtis Debby PARAS, MD    REFERRING DIAG: [phone] (ICD-10-CM) - Chronic left hip pain   THERAPY DIAG:  Other symptoms and signs involving  the musculoskeletal system  Pain in left hip  Muscle weakness (generalized)  Rationale for Evaluation and Treatment: Rehabilitation  ONSET DATE: chronic   SUBJECTIVE:   SUBJECTIVE STATEMENT: Patient reports she ran 2 miles around Christmas and then her whole hip hurt for the rest of the day. It mostly hurts when she sits.   EVAL: Patient reports chronic Lt hip pain that has been ongoing since February 2025 noticing it when she started cycling. 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 Continues to notice pain if she is sitting in a soft chair or in a more slumped position. Overall her left knee is going well s/p Lt ACLR on 05/07/24, but the hip pain overall has not changed since onset. At recent ortho f/u she was ordered an MRI of the hip and lumbar spine, but has not had these yet. The pain is mostly along the lateral hip, but more recently (within the past month) is having pain along the Lt buttock. No recent numbness or tingling. No popping/clicking in the hip. Does occasionally feel instances of instability in the hip. Is unsure if the pain is deep or superficial. Gets short term relief with TPDN and massage. She reports history of chronic low back pain that has been ongoing for years. With squatting she still feels that she favors  the Lt side.   PERTINENT HISTORY: Lt ACLR 05/07/24 PAIN:  Are you having pain? Yes: NPRS scale: none currently; at worst 10 Pain location: Lt lateral/posterior hip Pain description: aggravating, feels like nerve pain  Aggravating factors: sitting, leg press, running Relieving factors: standing, walking  PRECAUTIONS: Other: no jumping, cutting, higher level activity secondary to ACLR  RED FLAGS: None   WEIGHT BEARING RESTRICTIONS: No  FALLS:  Has patient fallen in last 6 months? No  LIVING ENVIRONMENT: Lives with: lives with their family Lives in: House/apartment Stairs: Yes: Internal: flight steps; on right going up and  External: 3 steps; none Has following equipment at home: Crutches  OCCUPATION: umpire  PLOF: Independent  PATIENT GOALS: I want to get out of pain and get stronger.   NEXT MD VISIT: January 2026  OBJECTIVE:  Note: Objective measures were completed at Evaluation unless otherwise noted.  DIAGNOSTIC FINDINGS:  Lt hip X-ray: IMPRESSION: No acute abnormality noted.  PATIENT SURVEYS:   LEFS: 55/80  COGNITION: Overall cognitive status: Within functional limits for tasks assessed     SENSATION: Not tested  EDEMA:  No obvious swelling about the hip   MUSCLE LENGTH: Hamstrings: Full bilateral   POSTURE: anterior pelvic tilt  PALPATION: TTP Lt greater trochanter, gluteals, IT band Hypermobility with PAIVM lumbar spine with pain along L4-L5  LUMBAR ROM:   Active  A/PROM  eval  Flexion Full pain in Lt buttock  Extension Full   Right lateral flexion WNL  Left lateral flexion WNL  Right rotation WNL  Left rotation WNL   (Blank rows = not tested)  LOWER EXTREMITY ROM:  Active ROM Right eval Left eval  Hip flexion  WFL  Hip extension    Hip abduction    Hip adduction    Hip internal rotation  WFL  Hip external rotation  [facility]  Knee flexion    Knee extension    Ankle dorsiflexion    Ankle plantarflexion    Ankle inversion    Ankle eversion     (Blank rows = not tested)  LOWER EXTREMITY MMT:  MMT Right eval Left eval  Hip flexion 4+ 4  Hip extension 4+ 4+   Hip abduction 4+ 4  Hip adduction 4 4  Hip internal rotation  5  Hip external rotation  4+ pain   Knee flexion    Knee extension    Ankle dorsiflexion    Ankle plantarflexion    Ankle inversion    Ankle eversion     (Blank rows = not tested)  LOWER EXTREMITY SPECIAL TESTS:  (+ pain) FABER, FADIR, and hip scour   (-) SLR, sacral thrust, thigh thrust, Gaeslen's, SI compression, distraction   FUNCTIONAL TESTS:  Not tested   GAIT: Distance walked: 10 ft  Assistive device utilized:  None Level of assistance: Complete Independence Comments: no obvious gait abnormalities    [facility] Adult PT Treatment:                                                DATE: 11/24/23 Therapeutic Exercise: Prone pressup x 10  Prone press up with overpressure x 10  HEP review and update  Manual Therapy: LLE LAD 3 bouts  Neuromuscular re-ed: Supine TA march 2 x 10  SLR with posterior pelvic tilt 2 x 10  90/90 isometric hold 3 x 30 sec  Self Care: Discussed anatomy of the lumbar and hip region Discussed activities/positions to avoid with gym activity Posture education                                                                                                                         [facility] Adult PT Treatment:                                                DATE: 11/05/24 Therapeutic Exercise: Demonstrated,performed, and issued initial HEP. Review of ACLR HEP to continue for hip strengthening.  Standing lumbar extension- increased posterior hip pain Seated trunk flexion- no effect       PATIENT EDUCATION:  Education details: HEP  Person educated: Patient Education method: Explanation, Demonstration, Tactile cues, Verbal cues, and Handouts Education comprehension: verbalized understanding, returned demonstration, verbal cues required, tactile cues required, and needs further education  HOME EXERCISE PROGRAM: Access Code: [email or web address] URL: [email or web address] Date: 11/23/2024 Prepared by: Lucie Meeter  Exercises - Supine March  - 1 x daily - 7 x weekly - 2 sets - 10 reps - Supine Pelvic Tilt with Straight Leg Raise  - 1 x daily - 7 x weekly - 2 sets - 10 reps - Quadruped Alternating Leg Extensions  - 1 x daily - 7 x weekly - 2 sets - 10 reps - Supine 90/90 Abdominal Bracing  - 1 x daily - 7 x weekly - 3 sets - 30 sec  hold - Standing Lumbar Extension  - 1 x daily - 7 x weekly - 1 sets - 10 reps  ASSESSMENT:  CLINICAL IMPRESSION: Patient arrives without reports of  pain, but continues to have fluctuating high levels of pain. Completed extension biased movement with patient stating that prone press up with overpressure felt good. We discussed back care as it relates to gym activity and posture with patient verbalizing understanding. Completed core stabilization with patient requiring cues for proper engagement and allow for slow controlled dynamic movement. No reports of pain throughout session. Recommended trial of extension biased movement as part of HEP to determine effects on pain.   EVAL: Patient is a 49 y.o. female who was seen today for physical therapy evaluation and treatment for chronic left hip pain. She reports onset of hip pain began in February when she started cycling more. Overall her hip pain has stayed the same even following Lt ACLR in June. Upon assessment she is noted to have functional and pain free hip ROM, normal lumbar AROM though posterior hip pain elicited with trunk flexion, a positive FABER, FADIR, and hip scour, other lumbar/SI joint special tests are negative, bilateral hip weakness, and lumbar hypermobility. No centralization of symptoms with repeated trunk flexion and extension, though LAD of the LLE reduced her pain. Initial HEP was issued to include core stabilization as well as reviewing previously prescribed  HEP for ACLR rehab that includes hip strengthening to continue with patient verbalizing understanding. She will benefit from skilled PT to address the above stated deficits in order to optimize her function and assist in overall pain reduction.    OBJECTIVE IMPAIRMENTS: decreased activity tolerance, decreased endurance, decreased knowledge of condition, decreased strength, improper body mechanics, and pain.   ACTIVITY LIMITATIONS: carrying, lifting, bending, sitting, squatting, and bed mobility  PARTICIPATION LIMITATIONS: cleaning, driving, shopping, community activity, and occupation  PERSONAL FACTORS: Age, Fitness,  Past/current experiences, Profession, Time since onset of injury/illness/exacerbation, and 1 comorbidity: ACLR June 2025 are also affecting patient's functional outcome.   REHAB POTENTIAL: Good  CLINICAL DECISION MAKING: Stable/uncomplicated  EVALUATION COMPLEXITY: Low   GOALS: Goals reviewed with patient? Yes  SHORT TERM GOALS: Target date: 12/03/2024   Patient will be independent and compliant with initial HEP.   Baseline: initial HEP issued Goal status: INITIAL  2.  Patient will report </= 5/10 Lt hip pain to reduce current functional limitations.  Baseline: 7  Goal status: INITIAL  3.  Patient will squat with equal weight-bearing without onset of hip pain.   Baseline: reports favoring the LLE due to pain/weakness.  Goal status: INITIAL   LONG TERM GOALS: Target date: 01/05/25  Patient will score >/= 70/80 on the LEFS (MCID is 9) to signify clinically meaningful improvement in functional abilities.   Baseline: see above Goal status: INITIAL  2.  Patient will demonstrate 5/5 bilateral hip strength to improve stability about the chain with running activity.  Baseline: see above Goal status: INITIAL  3.  Patient will report Lt hip pain at worst rated as </= 2/10 to improve her tolerance to sitting activity.  Baseline: 7  Goal status: INITIAL  4.  Patient will be independent with advanced home program to progress/maintain current level of function.  Baseline: initial HEP issued  Goal status: INITIAL    PLAN:  PT FREQUENCY: 1-2x/week  PT DURATION: 8 weeks  PLANNED INTERVENTIONS: 97164- PT Re-evaluation, 97750- Physical Performance Testing, 97110-Therapeutic exercises, 97530- Therapeutic activity, [id number]- Neuromuscular re-education, 97535- Self Care, [id number]- Manual therapy, [id number]- Gait training, [phone]- Aquatic Therapy, [phone]- Electrical stimulation (unattended), [id number]- Electrical stimulation (manual), [id number]- Traction (mechanical), 20560 (1-2 muscles), 20561 (3+  muscles)- Dry Needling, Patient/Family education, Cryotherapy, and Moist heat  PLAN FOR NEXT SESSION: LAD, consider traction, core stabilization.   Khushbu Pippen, PT, DPT, ATC 11/23/2024 8:45 AM  "

## 2024-11-26 ENCOUNTER — Ambulatory Visit

## 2024-11-26 ENCOUNTER — Encounter: Payer: Self-pay | Admitting: Physician Assistant

## 2024-11-26 ENCOUNTER — Ambulatory Visit (INDEPENDENT_AMBULATORY_CARE_PROVIDER_SITE_OTHER): Admitting: Physician Assistant

## 2024-11-26 VITALS — BP 116/78 | HR 87 | Ht 63.0 in | Wt 131.0 lb

## 2024-11-26 DIAGNOSIS — R29898 Other symptoms and signs involving the musculoskeletal system: Secondary | ICD-10-CM | POA: Diagnosis not present

## 2024-11-26 DIAGNOSIS — M25552 Pain in left hip: Secondary | ICD-10-CM

## 2024-11-26 DIAGNOSIS — Z131 Encounter for screening for diabetes mellitus: Secondary | ICD-10-CM | POA: Diagnosis not present

## 2024-11-26 DIAGNOSIS — Z13220 Encounter for screening for lipoid disorders: Secondary | ICD-10-CM

## 2024-11-26 DIAGNOSIS — G8929 Other chronic pain: Secondary | ICD-10-CM | POA: Diagnosis not present

## 2024-11-26 DIAGNOSIS — Z1211 Encounter for screening for malignant neoplasm of colon: Secondary | ICD-10-CM | POA: Diagnosis not present

## 2024-11-26 DIAGNOSIS — Z1231 Encounter for screening mammogram for malignant neoplasm of breast: Secondary | ICD-10-CM | POA: Diagnosis not present

## 2024-11-26 DIAGNOSIS — R635 Abnormal weight gain: Secondary | ICD-10-CM

## 2024-11-26 DIAGNOSIS — M5136 Other intervertebral disc degeneration, lumbar region with discogenic back pain only: Secondary | ICD-10-CM | POA: Insufficient documentation

## 2024-11-26 DIAGNOSIS — Z0000 Encounter for general adult medical examination without abnormal findings: Secondary | ICD-10-CM | POA: Diagnosis not present

## 2024-11-26 DIAGNOSIS — Z9889 Other specified postprocedural states: Secondary | ICD-10-CM | POA: Diagnosis not present

## 2024-11-26 DIAGNOSIS — E7800 Pure hypercholesterolemia, unspecified: Secondary | ICD-10-CM

## 2024-11-26 DIAGNOSIS — M6281 Muscle weakness (generalized): Secondary | ICD-10-CM

## 2024-11-26 NOTE — Progress Notes (Signed)
 "  Complete physical exam  Patient: Whitney Turner   DOB: October 29, 1976   49 y.o. Female  MRN: [id number]  Subjective:    Chief Complaint  Patient presents with   Annual Exam   ..Discussed the use of AI scribe software for clinical note transcription with the patient, who gave verbal consent to proceed.  History of Present Illness Whitney Turner is a 49 year old female who presents to the clinic for annual CPE.   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   Weight gain and weight management - Concerned about weight gain, with an increase from 123 pounds in December 2025 to 131 pounds currently - Previously used a medication, liposlim, to aid weight loss when unable to exercise post-surgery, which was effective - Stopped drinking soda - Reports decreased desire for alcohol, attributing this change to the weight management medication     Most recent fall risk assessment:    11/26/2024   10:44 AM  Fall Risk   Falls in the past year? 0  Number falls in past yr: 0  Injury with Fall? 0  Risk for fall due to : No Fall Risks  Follow up Falls evaluation completed     Most recent depression screenings:    11/07/2024    9:01 AM 02/07/2023    9:33 AM  PHQ 2/9 Scores  PHQ - 2 Score 0 0  PHQ- 9 Score  0      Data saved with a previous flowsheet row definition        Patient Care Team: Kathleene Bergemann L, PA-C as PCP -  General (Family Medicine)   Show/hide medication list[1]  Review of Systems  All other systems reviewed and are negative.      Objective:     BP 116/78   Pulse 87   Ht 5' 3 (1.6 m)   Wt 131 lb (59.4 kg)   SpO2 99%   BMI 23.21 kg/m  BP Readings from Last 3 Encounters:  11/26/24 116/78  11/07/24 119/75  08/22/24 98/71   Wt Readings from Last 3 Encounters:  11/26/24 131 lb (59.4 kg)  11/07/24 128 lb (58.1 kg)  04/02/24 130 lb (59 kg)      Physical Exam  BP 116/78   Pulse 87   Ht 5' 3 (1.6 m)   Wt 131 lb (59.4 kg)   SpO2 99%   BMI 23.21 kg/m   General Appearance:    Alert, cooperative, no distress, appears stated age  Head:    Normocephalic, without obvious abnormality, atraumatic  Eyes:    PERRL, conjunctiva/corneas clear, EOM's intact, fundi    benign, both eyes  Ears:    Normal TM's and external ear canals, both ears  Nose:   Nares normal, septum midline, mucosa normal, no drainage    or sinus tenderness  Throat:   Lips, mucosa, and tongue normal; teeth and gums normal  Neck:   Supple,  symmetrical, trachea midline, no adenopathy;    thyroid:  no enlargement/tenderness/nodules; no carotid   bruit or JVD  Back:     Symmetric, no curvature, ROM normal, no CVA tenderness  Lungs:     Clear to auscultation bilaterally, respirations unlabored  Chest Wall:    No tenderness or deformity   Heart:    Regular rate and rhythm, S1 and S2 normal, no murmur, rub   or gallop     Abdomen:     Soft, non-tender, bowel sounds active all four quadrants,    no masses, no organomegaly        Extremities:   Extremities normal, atraumatic, no cyanosis or edema  Pulses:   2+ and symmetric all extremities  Skin:   Skin color, texture, turgor normal, no rashes or lesions  Lymph nodes:   Cervical, supraclavicular, and axillary nodes normal  Neurologic:   CNII-XII intact, normal strength, sensation and reflexes    throughout      Assessment & Plan:    Routine Health Maintenance  and Physical Exam  Immunization History  Administered Date(s) Administered   PFIZER(Purple Top)SARS-COV-2 Vaccination 07/24/2020, 08/14/2020   PPD Test 08/22/2024   Tdap 12/01/2012, 02/07/2023    Health Maintenance  Topic Date Due   Fecal DNA (Cologuard)  Never done   Mammogram  12/23/2023   Cervical Cancer Screening (HPV/Pap Cotest)  12/07/2024   COVID-19 Vaccine (3 - 2025-26 season) 12/12/2024 (Originally 07/23/2024)   Influenza Vaccine  02/19/2025 (Originally 06/22/2024)   Hepatitis B Vaccines 19-59 Average Risk (1 of 3 - 19+ 3-dose series) 11/26/2025 (Originally 10/02/1997)   DTaP/Tdap/Td (3 - Td or Tdap) 02/06/2033   Hepatitis C Screening  Completed   HIV Screening  Completed   Pneumococcal Vaccine  Aged Out   HPV VACCINES  Aged Out   Meningococcal B Vaccine  Aged Out    Discussed health benefits of physical activity, and encouraged her to engage in regular exercise appropriate for her age and condition. Whitney Turner was seen today for annual exam.  Diagnoses and all orders for this visit:  Routine physical examination -     CBC with Differential/Platelet -     CMP14+EGFR -     Lipid panel -     TSH -     VITAMIN D 25 Hydroxy (Vit-D Deficiency, Fractures) -     Vitamin B12 -     MM 3D SCREENING MAMMOGRAM BILATERAL BREAST -     Cologuard  Elevated LDL cholesterol level  Screening for diabetes mellitus -     CMP14+EGFR  Screening for lipid disorders -     Lipid panel  Visit for screening mammogram -     MM 3D SCREENING MAMMOGRAM BILATERAL BREAST  Colon cancer screening -     Cologuard  Abnormal weight gain  Degeneration of intervertebral disc of lumbar region with discogenic back pain  History of repair of ACL  Chronic left hip pain    Assessment & Plan Adult Wellness Visit Annual wellness visit conducted. Labs due for annual check-up. Discussed importance of regular exercise and healthy lifestyle. - Ordered annual labs. - Encouraged regular exercise and  healthy lifestyle. - Declined colonoscopy, agreed to cologuard - Mammogram to be scheduled, order placed today.  - PAP to be done with GYN.   Post-ACL surgery recovery and knee pain Recovering from second ACL surgery in June. Experiencing knee pain, especially with running. Running necessary due to officiating sports. Advised alternative exercises to maintain endurance without exacerbating  pain. - Continue physical therapy and Pilates. - Consider alternative exercises like walking or incline walking.  Lumbar disc disease with hip pain Hip pain attributed to lumbar disc disease. Engaging in core strengthening exercises and Pilates. Discussed importance of core strength to support spine and reduce hip pain. - Continue core strengthening exercises and Pilates.  Weight management Weight looks GREAT today.  Discussed weight management strategies. Previously used Liposlim for weight loss, effective but discontinued. Discussed potential side effects of liposlim. Encouraged microdosing if considering restarting. Discussed lifestyle changes to aid weight management. - Consider microdosing Lipase if restarting. 5-10 units weekly.  - Encouraged lifestyle changes for weight management.    Return in about 1 year (around 11/26/2025).     Marnisha Stampley, PA-C      [1]  Outpatient Medications Prior to Visit  Medication Sig   cetirizine (ZYRTEC) 10 MG tablet Take 10 mg by mouth daily.   EPINEPHrine  0.3 mg/0.3 mL IJ SOAJ injection Inject 0.3 mg into the muscle as needed for anaphylaxis.   etonogestrel -ethinyl estradiol  (NUVARING) 0.12-0.015 MG/24HR vaginal ring Insert vaginally and leave in place for 3 consecutive weeks, then remove for 1 week.   meloxicam  (MOBIC ) 15 MG tablet TAKE 1 TABLET BY MOUTH DAILY WITH A MEAL FOR 2 WEEKS THEN DAILY AS NEEDED.   tirzepatide  (MOUNJARO ) 10 MG/0.5ML Pen Liposlim.  Tirzepatide /Pyridoxine/Thiamine/L-Carnitine 10mg /mL.  Inject 2.5 mg/25 units subcu weekly for 4  weeks then 5 mg/50 units subcu weekly for 4 weeks then 7.5 mg/75 units subcu weekly for 4 weeks then 10 mg/100 units subcu weekly for 4 weeks then 15 mg/150 units subcu weekly   valACYclovir  (VALTREX ) 500 MG tablet Take 1 tablet (500 mg total) by mouth daily. Can increase to twice a day for 5 days in the event of a recurrence   No facility-administered medications prior to visit.   "

## 2024-11-26 NOTE — Patient Instructions (Signed)

## 2024-11-26 NOTE — Therapy (Signed)
 " OUTPATIENT PHYSICAL THERAPY LOWER EXTREMITY TREATMENT   Patient Name: Whitney Turner MRN: [id number] DOB:17-Jul-1976, 49 y.o., female Today's Date: 11/26/2024  END OF SESSION:  PT End of Session - 11/26/24 0845     Visit Number 3    Number of Visits 17    Date for Recertification  01/05/25    Authorization Type [employer]    Authorization - Visit Number 2    Authorization - Number of Visits 20    PT Start Time 0845    PT Stop Time 0928    PT Time Calculation (min) 43 min    Activity Tolerance Patient tolerated treatment well    Behavior During Therapy [facility] for tasks assessed/performed            Past Medical History:  Diagnosis Date   Allergy    Heart murmur    Herpes    Past Surgical History:  Procedure Laterality Date   HERNIA REPAIR  1982   KNEE SURGERY  08/2011   TONSILECTOMY/ADENOIDECTOMY WITH MYRINGOTOMY  2000   Patient Active Problem List   Diagnosis Date Noted   Degeneration of intervertebral disc of lumbar region with discogenic back pain 11/26/2024   Abnormal weight gain 03/05/2024   Chronic left hip pain 02/27/2024   Tibialis posterior tendinitis, left 03/10/2023   Mouth ulcer 03/08/2023   Sore throat 03/08/2023   Poor concentration 12/30/2020   Hyperactive behavior 12/30/2020   Chronic left shoulder pain 11/17/2020   Elevated LDL cholesterol level 12/24/2019   Right lower quadrant pain 12/24/2019   Right upper quadrant pain 12/24/2019   Effusion of right knee 06/25/2019   Hemorrhoids 12/04/2018   CIN I (cervical intraepithelial neoplasia I) 04/03/2018   Lateral epicondylitis of left elbow 02/15/2018   Left hand pain 02/23/2016   History of repair of ACL of Left knee 01/26/2016   Plantar fasciitis, bilateral 12/29/2015   Genital herpes 12/01/2012   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/2012   Allergic rhinitis 12/01/2012   Seasonal allergies 12/01/2012    PCP: Antoniette Vermell CROME, PA-C  REFERRING PROVIDER:   Curtis Debby PARAS, MD    REFERRING DIAG:  [phone] (ICD-10-CM) - Chronic left hip pain   THERAPY DIAG:  Other symptoms and signs involving the musculoskeletal system  Pain in left hip  Muscle weakness (generalized)  Rationale for Evaluation and Treatment: Rehabilitation  ONSET DATE: chronic   SUBJECTIVE:   SUBJECTIVE STATEMENT: Patient reports she doesn't notice the pain all the time. Has avoided aggravating activity since last session (running, stair climber). Some soreness right now. She feels that she can continue with her exercises on her own at this point since she has a better understanding of her pain.   EVAL: Patient reports chronic Lt hip pain that has been ongoing since February 2025 noticing it when she started cycling. 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 Continues to notice pain if she is sitting in a soft chair or in a more slumped position. Overall her left knee is going well s/p Lt ACLR on 05/07/24, but the hip pain overall has not changed since onset. At recent ortho f/u she was ordered an MRI of the hip and lumbar spine, but has not had these yet. The pain is mostly along the lateral hip, but more recently (within the past month) is having pain along the Lt buttock. No recent numbness or tingling. No popping/clicking in the hip. Does occasionally feel instances of instability in the hip. Is unsure  if the pain is deep or superficial. Gets short term relief with TPDN and massage. She reports history of chronic low back pain that has been ongoing for years. With squatting she still feels that she favors the Lt side.   PERTINENT HISTORY: Lt ACLR 05/07/24 PAIN:  Are you having pain? Yes: NPRS scale: 5 Pain location: Lt lateral/posterior hip Pain description: aggravating, feels like nerve pain  Aggravating factors: sitting, leg press, running Relieving factors: standing, walking  PRECAUTIONS: Other: no jumping, cutting, higher level activity secondary to ACLR  RED  FLAGS: None   WEIGHT BEARING RESTRICTIONS: No  FALLS:  Has patient fallen in last 6 months? No  LIVING ENVIRONMENT: Lives with: lives with their family Lives in: House/apartment Stairs: Yes: Internal: flight steps; on right going up and External: 3 steps; none Has following equipment at home: Crutches  OCCUPATION: umpire  PLOF: Independent  PATIENT GOALS: I want to get out of pain and get stronger.   NEXT MD VISIT: January 2026  OBJECTIVE:  Note: Objective measures were completed at Evaluation unless otherwise noted.  DIAGNOSTIC FINDINGS:  Lt hip X-ray: IMPRESSION: No acute abnormality noted.  PATIENT SURVEYS:   LEFS: 55/80  COGNITION: Overall cognitive status: Within functional limits for tasks assessed     SENSATION: Not tested  EDEMA:  No obvious swelling about the hip   MUSCLE LENGTH: Hamstrings: Full bilateral   POSTURE: anterior pelvic tilt  PALPATION: TTP Lt greater trochanter, gluteals, IT band Hypermobility with PAIVM lumbar spine with pain along L4-L5  LUMBAR ROM:   Active  A/PROM  eval  Flexion Full pain in Lt buttock  Extension Full   Right lateral flexion WNL  Left lateral flexion WNL  Right rotation WNL  Left rotation WNL   (Blank rows = not tested)  LOWER EXTREMITY ROM:  Active ROM Right eval Left eval  Hip flexion  WFL  Hip extension    Hip abduction    Hip adduction    Hip internal rotation  WFL  Hip external rotation  [facility]  Knee flexion    Knee extension    Ankle dorsiflexion    Ankle plantarflexion    Ankle inversion    Ankle eversion     (Blank rows = not tested)  LOWER EXTREMITY MMT:  MMT Right eval Left eval  Hip flexion 4+ 4  Hip extension 4+ 4+   Hip abduction 4+ 4  Hip adduction 4 4  Hip internal rotation  5  Hip external rotation  4+ pain   Knee flexion    Knee extension    Ankle dorsiflexion    Ankle plantarflexion    Ankle inversion    Ankle eversion     (Blank rows = not tested)  LOWER  EXTREMITY SPECIAL TESTS:  (+ pain) FABER, FADIR, and hip scour   (-) SLR, sacral thrust, thigh thrust, Gaeslen's, SI compression, distraction   FUNCTIONAL TESTS:  Not tested   GAIT: Distance walked: 10 ft  Assistive device utilized: None Level of assistance: Complete Independence Comments: no obvious gait abnormalities   [facility] Adult PT Treatment:                                                DATE: 11/26/24 Therapeutic Exercise: Reviewed and updated HEP discussing frequency, sets, reps, and ways to progress independently Performed as needed  Educated  on repeated movement theory   Self Care: Reviewed back care as it relates to posture, body mechanics, lifting, and current workout regimen.  Discussed lumbar anatomy  Reviewed current ACLR restrictions   [facility] Adult PT Treatment:                                                DATE: 11/24/23 Therapeutic Exercise: Prone pressup x 10  Prone press up with overpressure x 10  HEP review and update  Manual Therapy: LLE LAD 3 bouts  Neuromuscular re-ed: Supine TA march 2 x 10  SLR with posterior pelvic tilt 2 x 10  90/90 isometric hold 3 x 30 sec   Self Care: Discussed anatomy of the lumbar and hip region Discussed activities/positions to avoid with gym activity Posture education                                                                                                                         [facility] Adult PT Treatment:                                                DATE: 11/05/24 Therapeutic Exercise: Demonstrated,performed, and issued initial HEP. Review of ACLR HEP to continue for hip strengthening.  Standing lumbar extension- increased posterior hip pain Seated trunk flexion- no effect       PATIENT EDUCATION:  Education details: see treatment   Person educated: Patient Education method: Explanation, Demonstration, Verbal cues, and Handouts Education comprehension: verbalized understanding, returned demonstration, and  verbal cues required  HOME EXERCISE PROGRAM: Access Code: [email or web address] URL: [email or web address] Date: 11/26/2024 Prepared by: Lucie Meeter  Exercises - Standing Lumbar Extension  - 6 x daily - 7 x weekly - 1 sets - 10 reps - Prone Press Up  - 6 x daily - 7 x weekly - 1 sets - 10 reps - Supine March  - 1 x daily - 7 x weekly - 2 sets - 10 reps - Supine Pelvic Tilt with Straight Leg Raise  - 1 x daily - 7 x weekly - 2 sets - 10 reps - Quadruped Alternating Leg Extensions  - 1 x daily - 7 x weekly - 2 sets - 10 reps - Supine 90/90 Abdominal Bracing  - 1 x daily - 7 x weekly - 3 sets - 30 sec  hold - Supine Dead Bug with Leg Extension  - 1 x daily - 7 x weekly - 2 sets - 10 reps - Half Kneeling Anti-Rotation Press - Forward Leg Opposite Anchor Side  - 1 x daily - 7 x weekly - 2 sets - 10 reps - Standing Diagonal Chop  - 1 x daily - 7 x weekly - 2  sets - 10 reps - Quadruped Hip Abduction with Resistance Loop  - 1 x daily - 7 x weekly - 2 sets - 10 reps  ASSESSMENT:  CLINICAL IMPRESSION: Valisha overall feels that she has a better understanding of her pain and can continue with her HEP independently as well as modifying current activity to reduce stress on her back/hip. At patient's request PT will be placed on hold as she works to further progress her core stabilization and hip strengthening with plans to f/u if needed in the next 30 days.   EVAL: Patient is a 49 y.o. female who was seen today for physical therapy evaluation and treatment for chronic left hip pain. She reports onset of hip pain began in February when she started cycling more. Overall her hip pain has stayed the same even following Lt ACLR in June. Upon assessment she is noted to have functional and pain free hip ROM, normal lumbar AROM though posterior hip pain elicited with trunk flexion, a positive FABER, FADIR, and hip scour, other lumbar/SI joint special tests are negative, bilateral hip weakness, and lumbar  hypermobility. No centralization of symptoms with repeated trunk flexion and extension, though LAD of the LLE reduced her pain. Initial HEP was issued to include core stabilization as well as reviewing previously prescribed HEP for ACLR rehab that includes hip strengthening to continue with patient verbalizing understanding. She will benefit from skilled PT to address the above stated deficits in order to optimize her function and assist in overall pain reduction.    OBJECTIVE IMPAIRMENTS: decreased activity tolerance, decreased endurance, decreased knowledge of condition, decreased strength, improper body mechanics, and pain.   ACTIVITY LIMITATIONS: carrying, lifting, bending, sitting, squatting, and bed mobility  PARTICIPATION LIMITATIONS: cleaning, driving, shopping, community activity, and occupation  PERSONAL FACTORS: Age, Fitness, Past/current experiences, Profession, Time since onset of injury/illness/exacerbation, and 1 comorbidity: ACLR June 2025 are also affecting patient's functional outcome.   REHAB POTENTIAL: Good  CLINICAL DECISION MAKING: Stable/uncomplicated  EVALUATION COMPLEXITY: Low   GOALS: Goals reviewed with patient? Yes  SHORT TERM GOALS: Target date: 12/03/2024   Patient will be independent and compliant with initial HEP.   Baseline: initial HEP issued Goal status: MET  2.  Patient will report </= 5/10 Lt hip pain to reduce current functional limitations.  Baseline: 7  Goal status: INITIAL  3.  Patient will squat with equal weight-bearing without onset of hip pain.   Baseline: reports favoring the LLE due to pain/weakness.  Goal status: INITIAL   LONG TERM GOALS: Target date: 01/05/25  Patient will score >/= 70/80 on the LEFS (MCID is 9) to signify clinically meaningful improvement in functional abilities.   Baseline: see above Goal status: INITIAL  2.  Patient will demonstrate 5/5 bilateral hip strength to improve stability about the chain with  running activity.  Baseline: see above Goal status: INITIAL  3.  Patient will report Lt hip pain at worst rated as </= 2/10 to improve her tolerance to sitting activity.  Baseline: 7  Goal status: INITIAL  4.  Patient will be independent with advanced home program to progress/maintain current level of function.  Baseline: initial HEP issued  Goal status: INITIAL    PLAN:  PT FREQUENCY: 1-2x/week  PT DURATION: 8 weeks  PLANNED INTERVENTIONS: [id number]- PT Re-evaluation, 97750- Physical Performance Testing, 97110-Therapeutic exercises, 97530- Therapeutic activity, [id number]- Neuromuscular re-education, 97535- Self Care, [id number]- Manual therapy, [id number]- Gait training, [phone]- Aquatic Therapy, H9716- Electrical stimulation (unattended), [id number]- Electrical stimulation (manual), [id number]- Traction (  mechanical), 79439 (1-2 muscles), 20561 (3+ muscles)- Dry Needling, Patient/Family education, Cryotherapy, and Moist heat  PLAN FOR NEXT SESSION: PT on hold   Leoda Smithhart, PT, DPT, ATC 11/26/2024 12:53 PM  "

## 2024-11-29 ENCOUNTER — Ambulatory Visit

## 2024-12-04 ENCOUNTER — Other Ambulatory Visit

## 2024-12-04 ENCOUNTER — Other Ambulatory Visit: Payer: Self-pay | Admitting: Medical Genetics

## 2024-12-04 DIAGNOSIS — Z006 Encounter for examination for normal comparison and control in clinical research program: Secondary | ICD-10-CM

## 2024-12-05 ENCOUNTER — Ambulatory Visit

## 2024-12-05 ENCOUNTER — Telehealth: Payer: Self-pay | Admitting: *Deleted

## 2024-12-05 NOTE — Telephone Encounter (Signed)
 Returned call from 12/04/2024 at 1:44 PM. Left patient a message that the office will schedule annual for after 12/07/2024 with Dr. Cleatus. Also, to call or MyChart message the office if not able to keep scheduled appointment.

## 2024-12-06 LAB — CBC WITH DIFFERENTIAL/PLATELET
Basophils Absolute: 0 x10E3/uL (ref 0.0–0.2)
Basos: 1 %
EOS (ABSOLUTE): 0.1 x10E3/uL (ref 0.0–0.4)
Eos: 3 %
Hematocrit: 38.9 % (ref 34.0–46.6)
Hemoglobin: 12.9 g/dL (ref 11.1–15.9)
Immature Grans (Abs): 0 x10E3/uL (ref 0.0–0.1)
Immature Granulocytes: 0 %
Lymphocytes Absolute: 1.4 x10E3/uL (ref 0.7–3.1)
Lymphs: 35 %
MCH: 30.3 pg (ref 26.6–33.0)
MCHC: 33.2 g/dL (ref 31.5–35.7)
MCV: 91 fL (ref 79–97)
Monocytes Absolute: 0.3 x10E3/uL (ref 0.1–0.9)
Monocytes: 8 %
Neutrophils Absolute: 2.2 x10E3/uL (ref 1.4–7.0)
Neutrophils: 53 %
Platelets: 301 x10E3/uL (ref 150–450)
RBC: 4.26 x10E6/uL (ref 3.77–5.28)
RDW: 11.9 % (ref 11.7–15.4)
WBC: 4 x10E3/uL (ref 3.4–10.8)

## 2024-12-06 LAB — LIPID PANEL
Chol/HDL Ratio: 3.4 ratio (ref 0.0–4.4)
Cholesterol, Total: 233 mg/dL — ABNORMAL HIGH (ref 100–199)
HDL: 69 mg/dL
LDL Chol Calc (NIH): 153 mg/dL — ABNORMAL HIGH (ref 0–99)
Triglycerides: 63 mg/dL (ref 0–149)
VLDL Cholesterol Cal: 11 mg/dL (ref 5–40)

## 2024-12-06 LAB — CMP14+EGFR
ALT: 17 IU/L (ref 0–32)
AST: 17 IU/L (ref 0–40)
Albumin: 4.2 g/dL (ref 3.9–4.9)
Alkaline Phosphatase: 39 IU/L — ABNORMAL LOW (ref 41–116)
BUN/Creatinine Ratio: 17 (ref 9–23)
BUN: 15 mg/dL (ref 6–24)
Bilirubin Total: 0.3 mg/dL (ref 0.0–1.2)
CO2: 22 mmol/L (ref 20–29)
Calcium: 9.2 mg/dL (ref 8.7–10.2)
Chloride: 103 mmol/L (ref 96–106)
Creatinine, Ser: 0.89 mg/dL (ref 0.57–1.00)
Globulin, Total: 2.6 g/dL (ref 1.5–4.5)
Glucose: 84 mg/dL (ref 70–99)
Potassium: 4.3 mmol/L (ref 3.5–5.2)
Sodium: 138 mmol/L (ref 134–144)
Total Protein: 6.8 g/dL (ref 6.0–8.5)
eGFR: 81 mL/min/1.73

## 2024-12-06 LAB — VITAMIN D 25 HYDROXY (VIT D DEFICIENCY, FRACTURES): Vit D, 25-Hydroxy: 76.8 ng/mL (ref 30.0–100.0)

## 2024-12-06 LAB — VITAMIN B12: Vitamin B-12: 576 pg/mL (ref 232–1245)

## 2024-12-06 LAB — TSH: TSH: 1.06 u[IU]/mL (ref 0.450–4.500)

## 2024-12-07 ENCOUNTER — Ambulatory Visit: Payer: Self-pay | Admitting: Physician Assistant

## 2024-12-07 NOTE — Progress Notes (Signed)
 Whitney Turner,   Hemoglobin looks good.  Thyroid looks normal.  Vitamin D  looks amazing.  B12 normal.   LDL, bad cholesterol, not to goal. Overall cardiovascular risk is still low. Continue to keep diet low in fried/fatty/processed foods. Recheck in 1 year. If continues to stay elevated we can consider cholesterol lowering drug.   .The 10-year ASCVD risk score (Arnett DK, et al., 2019) is: 0.7%   Values used to calculate the score:     Age: 49 years     Clinically relevant sex: Female     Is Non-Hispanic African American: No     Diabetic: No     Tobacco smoker: No     Systolic Blood Pressure: 116 mmHg     Is BP treated: No     HDL Cholesterol: 69 mg/dL     Total Cholesterol: 233 mg/dL

## 2024-12-11 LAB — COLOGUARD: COLOGUARD: NEGATIVE

## 2024-12-11 NOTE — Progress Notes (Signed)
 Negative cologuard. Next screening in 3 years.

## 2024-12-12 ENCOUNTER — Ambulatory Visit (INDEPENDENT_AMBULATORY_CARE_PROVIDER_SITE_OTHER): Admitting: Obstetrics & Gynecology

## 2024-12-12 ENCOUNTER — Encounter: Payer: Self-pay | Admitting: Obstetrics & Gynecology

## 2024-12-12 ENCOUNTER — Other Ambulatory Visit (HOSPITAL_COMMUNITY)
Admission: RE | Admit: 2024-12-12 | Discharge: 2024-12-12 | Disposition: A | Source: Ambulatory Visit | Attending: Obstetrics & Gynecology | Admitting: Obstetrics & Gynecology

## 2024-12-12 VITALS — Ht 61.0 in | Wt 125.0 lb

## 2024-12-12 DIAGNOSIS — Z01419 Encounter for gynecological examination (general) (routine) without abnormal findings: Secondary | ICD-10-CM | POA: Insufficient documentation

## 2024-12-12 NOTE — Progress Notes (Signed)
 "   GYNECOLOGY ANNUAL PREVENTATIVE CARE ENCOUNTER NOTE  History:    Whitney Turner is a 49 y.o. [id number] female here for a routine annual gynecologic exam.  Current complaints: none.   Denies abnormal vaginal bleeding, discharge, pelvic pain, problems with intercourse or other gynecologic concerns.  Gynecologic History Patient's last menstrual period was 12/07/2024 (approximate). Contraception: NuvaRing vaginal inserts Last Pap: 12/08/2023. Result was normal with negative HPV.  Had normal pap with positive HRHPV in 11/25/2022. Last Mammogram: 12/22/2022.  Result was normal; already scheduled on 01/08/2025 Last Cologuard: 12/05/2024.  Result was normal  Obstetric History OB History  Gravida Para Term Preterm AB Living  5 3 3  2 3   SAB IAB Ectopic Multiple Live Births  2        # Outcome Date GA Lbr Len/2nd Weight Sex Type Anes PTL Lv  5 SAB           4 SAB           3 Term           2 Term           1 Term             Past Medical History:  Diagnosis Date   Allergy    Heart murmur    Herpes     Past Surgical History:  Procedure Laterality Date   HERNIA REPAIR  1982   KNEE SURGERY  08/2011   TONSILECTOMY/ADENOIDECTOMY WITH MYRINGOTOMY  2000    Medications Ordered Prior to Encounter[1]  Allergies[2]  Social History:  reports that she has never smoked. She has never used smokeless tobacco. She reports current alcohol use. She reports that she does not use drugs.  Family History  Problem Relation Age of Onset   Hypertension Father    Hyperlipidemia Father    Cancer Maternal Grandmother        breast cancer   Cancer Maternal Grandfather        lung cancer   Alzheimer's disease Paternal Grandmother    Cancer Paternal Grandfather        lung cancer    The following portions of the patient's history were reviewed and updated as appropriate: allergies, current medications, past family history, past medical history, past social history, past surgical history and problem  list.  Review of Systems Pertinent items noted in HPI and remainder of comprehensive ROS otherwise negative.  Physical Exam:  Ht 5' 1 (1.549 m)   Wt 125 lb (56.7 kg)   LMP 12/07/2024 (Approximate)   BMI 23.62 kg/m  CONSTITUTIONAL: Well-developed, well-nourished female in no acute distress.  HENT:  Normocephalic, atraumatic, External right and left ear normal.  EYES: Conjunctivae and EOM are normal. Pupils are equal, round, and reactive to light. No scleral icterus.  NECK: Normal range of motion, supple, no masses observed. SKIN: Skin is warm and dry. No rash noted. Not diaphoretic. No erythema. No pallor. MUSCULOSKELETAL: Normal range of motion. No tenderness.  No cyanosis, clubbing, or edema. NEUROLOGIC: Alert and oriented to person, place, and time. Normal muscle tone coordination.  PSYCHIATRIC: Normal mood and affect. Normal behavior. Normal judgment and thought content. CARDIOVASCULAR: Normal heart rate noted, regular rhythm RESPIRATORY: Clear to auscultation bilaterally. Effort and breath sounds normal, no problems with respiration noted. BREASTS: Symmetric in size. No masses, tenderness, skin changes, nipple drainage, or lymphadenopathy bilaterally. Performed in the presence of a chaperone. ABDOMEN: Soft, no distention noted.  No tenderness, rebound or guarding.  PELVIC: Normal  appearing external genitalia and urethral meatus; normal appearing vaginal mucosa and cervix.  No abnormal vaginal discharge noted.  Pap smear obtained.  Normal uterine size, no other palpable masses, no uterine or adnexal tenderness.  Performed in the presence of a chaperone.  Assessment and Plan:    1. Well woman exam with routine gynecological exam (Primary) - Cytology - PAP Will follow up results of pap smear and manage accordingly. Normal breast examination today, she was advised to perform periodic self breast examinations.  Mammogram already scheduled for breast cancer screening. Colon cancer  screening is up to date. Routine preventative health maintenance measures emphasized. Please refer to After Visit Summary for other counseling recommendations.      GLORIS HUGGER, MD, FACOG Obstetrician & Gynecologist, [facility] for [employer], [facility]     [1]  Current Outpatient Medications on File Prior to Visit  Medication Sig Dispense Refill   cetirizine (ZYRTEC) 10 MG tablet Take 10 mg by mouth daily.     EPINEPHrine  0.3 mg/0.3 mL IJ SOAJ injection Inject 0.3 mg into the muscle as needed for anaphylaxis. 1 each 1   etonogestrel -ethinyl estradiol  (NUVARING) 0.12-0.015 MG/24HR vaginal ring Insert vaginally and leave in place for 3 consecutive weeks, then remove for 1 week. 3 each 3   meloxicam  (MOBIC ) 15 MG tablet TAKE 1 TABLET BY MOUTH DAILY WITH A MEAL FOR 2 WEEKS THEN DAILY AS NEEDED. 30 tablet 2   tirzepatide  (MOUNJARO ) 10 MG/0.5ML Pen Liposlim.  Tirzepatide /Pyridoxine/Thiamine/L-Carnitine 10mg /mL.  Inject 2.5 mg/25 units subcu weekly for 4 weeks then 5 mg/50 units subcu weekly for 4 weeks then 7.5 mg/75 units subcu weekly for 4 weeks then 10 mg/100 units subcu weekly for 4 weeks then 15 mg/150 units subcu weekly 3 mL 11   valACYclovir  (VALTREX ) 500 MG tablet Take 1 tablet (500 mg total) by mouth daily. Can increase to twice a day for 5 days in the event of a recurrence 90 tablet 2   No current facility-administered medications on file prior to visit.  [2]  Allergies Allergen Reactions   Amoxicillin  Shortness Of Breath, Itching, Nausea And Vomiting and Swelling    Fever and nasal drainage   "

## 2024-12-19 ENCOUNTER — Ambulatory Visit: Payer: Self-pay | Admitting: Obstetrics & Gynecology

## 2024-12-19 LAB — CYTOLOGY - PAP
Comment: NEGATIVE
Diagnosis: NEGATIVE
High risk HPV: NEGATIVE

## 2024-12-26 LAB — GENECONNECT MOLECULAR SCREEN: Genetic Analysis Overall Interpretation: NEGATIVE

## 2025-01-03 ENCOUNTER — Ambulatory Visit: Admitting: Obstetrics and Gynecology

## 2025-01-08 ENCOUNTER — Ambulatory Visit

## 2025-11-27 ENCOUNTER — Encounter: Admitting: Physician Assistant
